# Patient Record
Sex: Female | Born: 1970 | Race: White | Hispanic: No | Marital: Single | Smoking: Current every day smoker
Health system: Southern US, Community
[De-identification: ages and names within clinical notes are randomized; demographics above are authoritative.]

## PROBLEM LIST (undated history)

## (undated) DIAGNOSIS — T79A0XA Compartment syndrome, unspecified, initial encounter: Secondary | ICD-10-CM

## (undated) DIAGNOSIS — R1310 Dysphagia, unspecified: Secondary | ICD-10-CM

## (undated) DIAGNOSIS — F17200 Nicotine dependence, unspecified, uncomplicated: Secondary | ICD-10-CM

## (undated) DIAGNOSIS — F1910 Other psychoactive substance abuse, uncomplicated: Secondary | ICD-10-CM

## (undated) DIAGNOSIS — F319 Bipolar disorder, unspecified: Secondary | ICD-10-CM

## (undated) DIAGNOSIS — M21372 Foot drop, left foot: Secondary | ICD-10-CM

## (undated) DIAGNOSIS — F1010 Alcohol abuse, uncomplicated: Secondary | ICD-10-CM

## (undated) DIAGNOSIS — E559 Vitamin D deficiency, unspecified: Secondary | ICD-10-CM

## (undated) DIAGNOSIS — N189 Chronic kidney disease, unspecified: Secondary | ICD-10-CM

## (undated) DIAGNOSIS — K029 Dental caries, unspecified: Secondary | ICD-10-CM

## (undated) HISTORY — PX: TONSILLECTOMY: SUR1361

## (undated) HISTORY — PX: HAND SURGERY: SHX662

## (undated) HISTORY — DX: Foot drop, left foot: M21.372

## (undated) HISTORY — DX: Vitamin D deficiency, unspecified: E55.9

## (undated) HISTORY — DX: Other psychoactive substance abuse, uncomplicated: F19.10

## (undated) HISTORY — DX: Bipolar disorder, unspecified: F31.9

## (undated) HISTORY — DX: Dysphagia, unspecified: R13.10

## (undated) HISTORY — PX: OTHER SURGICAL HISTORY: SHX169

---

## 2007-12-12 ENCOUNTER — Inpatient Hospital Stay (HOSPITAL_COMMUNITY): Admission: AD | Admit: 2007-12-12 | Discharge: 2007-12-12 | Payer: Self-pay | Admitting: Obstetrics & Gynecology

## 2007-12-15 ENCOUNTER — Observation Stay (HOSPITAL_COMMUNITY): Admission: AD | Admit: 2007-12-15 | Discharge: 2007-12-16 | Payer: Self-pay | Admitting: Obstetrics and Gynecology

## 2008-01-26 ENCOUNTER — Encounter (INDEPENDENT_AMBULATORY_CARE_PROVIDER_SITE_OTHER): Payer: Self-pay | Admitting: Obstetrics & Gynecology

## 2008-01-26 ENCOUNTER — Inpatient Hospital Stay (HOSPITAL_COMMUNITY): Admission: AD | Admit: 2008-01-26 | Discharge: 2008-01-29 | Payer: Self-pay | Admitting: Obstetrics & Gynecology

## 2011-04-07 NOTE — Op Note (Signed)
[name], [name] NO.:  [id number]   MEDICAL RECORD NO.:  [id number]          PATIENT TYPE:  INP   LOCATION:  9118                          FACILITY:  [facility]   PHYSICIAN:  Freddy Finner, M.D.   DATE OF BIRTH:  07-Mar-1971   DATE OF PROCEDURE:  01/26/2008  DATE OF DISCHARGE:                               OPERATIVE REPORT   PREOPERATIVE DIAGNOSIS:  Intrauterine pregnancy 38 1/[redacted] weeks gestation  in labor with fetal distress and suspected marginal placental abruption.   POSTOPERATIVE DIAGNOSIS:  1. Intrauterine pregnancy 38 1/[redacted] weeks gestation in labor with fetal      distress and suspected marginal placental abruption.  2. Small serosal myoma on the posterior fundal surface.   OPERATIVE PROCEDURE:  Primary low transverse cervical cesarean section.   SURGEON:  Freddy Finner, M.D.   ANESTHESIA:  General endotracheal anesthesia.   COMPLICATIONS:  None.   OPERATIVE FINDINGS:  Delivery of a viable female infant, Apgars 7 at 1  minute and 9 at 5 minutes assigned by Dr. Alison Murray in attendance.  Arterial cord blood pH was 7.29.  Fetal weight was less than 6 pounds.  Normal tubes and ovaries.   INDICATIONS FOR PROCEDURE:  The patient is a 40 year old single white  female gravida 3, para 1, whose first pregnancy delivered vaginally in  1996.  Her prenatal course was remarkable for some preterm labor  controlled by oral tocolytics and rest.  She presented on the day of  delivery after an office visit at which time she was seen to be 2-3 cm  dilated.  She presented again several hours later with a history of  regular contractions since approximately 3 p.m.  Her cervix was  initially unchanged and triaged and she was allowed to walk for one  hour, remonitored, rechecked, and was found to be changing her cervix  which was said to be 3-4 cm, 100%.  At that time, variable decelerations  were noted.  After her arrival in labor and delivery, she was evaluated,  examined, there  was bloody show with membranes intact, the cervix was 4-  5 dilated, 100% effaced.  The membrane was ruptured and there was a  small amount of blood, but no port wine type fluid.  Clear fluid was  mixed with small clots.  A scalp lead was applied, continuous monitoring  was carried out using the scalp lead.  I stayed at the bedside observing  the labor and the fetal heart rate tracing and it progressively worsened  with very deep severe variable type decelerations and decreasing  baseline and some decreasing beat-to-beat variability.  At this point,  it was elected to proceed with cesarean delivery.   DESCRIPTION OF PROCEDURE:  The patient was taken to the operating room.  Fetal monitoring was re-established immediately and fetal heart rate at  that time was approximately 133.  Within a minute or so of re-  establishing the monitor, we had another deep deceleration and it was  elected to proceed with general rather than regional anesthesia.  The  abdomen was prepped in the usual fashion.  A Foley  catheter was placed  using sterile technique.  Sterile drapes were applied.  The induction  was accomplished and the procedure started.  A transverse lower  abdominal incision was made and carried sharply down to fascia which was  entered sharply and extended the full extent of the skin incision.  The  rectus sheath was developed superiorly and inferiorly with blunt and  sharp dissection.  The rectus muscles were divided in the midline.  The  peritoneum was entered sharply and extended bluntly to the extent of a  skin incision.  A bladder blade was placed.  A transverse incision was  made in the visceroperitoneum overlying the lower segment.  The lower  segment was then entered with a scalpel and the incision extended  transversely.  The infant was elevated out of the pelvis.  A Kiwi vacuum  extractor was then used to ease the delivery and a viable female was  delivered without significant  intraoperative complications or trauma.  There was a loose nuchal cord and a body cord and a cord around the  feet.  Statistics on the baby are noted above.  The placenta was  delivered and there was less than 5% marginal separation of an anterior  implanted lower segment placenta.  There were three vessels in the cord.  Manual exploration of the uterus confirmed complete evacuation of  products of conception.  The uterus was delivered on the anterior  abdominal wall.  The patient was given 1 gram Ancef as soon as possible  after starting the procedure.  A very small superficial myoma was noted  on the posterior fundal surface measuring approximately 5-6 mm in  greatest diameter and 1-2 mm in thickness.  This was left in place.  Tubes and ovaries were normal.  The bladder blade was replaced.  The  lower uterine segment was then closed with a running suture of 0  Monocryl for the first layer with locking technique and an imbricating  second layer of the same suture.  Hemostasis was complete.  The bladder  flap was reapproximated with interrupted figure-of-eight sutures of 0  Vicryl.  The uterus was delivered back into the abdominal cavity.  Again, hemostasis was complete.  Pack, needle, and instrument counts  were correct.  The abdominal incision was closed in layers.  0 Monocryl  was used to close the peritoneum and reapproximate the rectus muscles,  the fascia was closed with a running double 0 PDS.  Irrigation in the  subcu space was carried out.  The subcutaneous tissue was reapproximated  with a running 2-0 plain suture.  The skin was closed with skin staples  and 1/4 inch Steri-Strips.  The patient tolerated the procedure well.  She was awakened and taken to recovery in good condition.      Freddy Finner, M.D.  Electronically Signed     WRN/MEDQ  D:  01/26/2008  T:  01/26/2008  Job:  [id number]

## 2011-04-10 NOTE — Discharge Summary (Signed)
[name], [name]           ACCOUNT NO.:  [id number]   MEDICAL RECORD NO.:  [id number]          PATIENT TYPE:  INP   LOCATION:  9118                          [facility]ILITY:  [facility]   PHYSICIAN:  Dineen Kid. Rana Snare, M.D.    DATE OF BIRTH:  1971/01/21   DATE OF ADMISSION:  01/26/2008  DATE OF DISCHARGE:  01/29/2008                               DISCHARGE SUMMARY   ADMITTING DIAGNOSES:  1. Intrauterine pregnancy at 55 and 1/2 weeks' estimated gestational      age.  2. Spontaneous onset of labor.   DISCHARGE DIAGNOSES:  1. Status post low transverse cesarean section, secondary to marginal      placental abruption.  2. Viable female infant.   PROCEDURE:  Primary low transverse cesarean section.   REASON FOR ADMISSION:  Please see written H&P.   HOSPITAL COURSE:  The patient is 40 year old white single female,  gravida 3, para 1, who was admitted to [facility] with  spontaneous onset of labor.  On admission, vital signs were stable.  Cervix was dilated 3-4 cm, 100% effaced.  Upon monitoring, variable  decelerations were noted.  After the patient was transferred to the  labor delivery area, she was reexamined and found to be 4-5 cm dilated,  100% effaced with bloody show noted and membranes intact.  Artificial  rupture of membranes was performed with a small amount of blood noted.  No port wine staining.  Some clear fluid was noted, mixed with some  small clotting.  A scalp lead was applied to the fetal vertex and  continuous monitoring was carried out.  The physician stayed at the  patient's bedside in observation of the labor and the fetal heart tone  tracing.  It was progressive, worsened with deep variable decelerations,  and decision was made to transfer patient to the operating room for a  stat cesarean delivery.  The patient was then transferred to the  operating room.  She was prepped, and general anesthesia was given.  A  low transverse incision was made with  delivery of a viable female infant  weighing 5 pounds 4 ounces with Apgars of 7 at one minute and 9 at five  minutes.  Arterial cord pH was 7.29.  The patient tolerated the  procedure well and was taken to the recovery room in stable condition.  On postoperative day 1, the patient was without complaints.  Vitals  signs were stable.  She was afebrile.  Abdomen soft.  Fundus firm and  nontender.  A small amount of drainage was noted on the abdominal  dressing, and Foley was draining with adequate amount of dilute urine.  Laboratory findings showed hemoglobin of 10.2, platelet count of  215,000, and WBC count of 12.4.  Blood type was noted to be O positive.  On postoperative day 2, the patient was without complaints.  Vital signs  remained stable.  She was afebrile.  Abdominal dressing was noted to be  clean, dry, and intact.  Abdomen soft with good return of bowel  function.  The fundus was firm and nontender.  On postoperative day 3,  the  patient was without complaints.  Vital signs were stable.  She was  afebrile.  Fundus firm and nontender.  Incision was clean, dry, and  intact.  Staples were removed, and the patient was later discharged  home.   CONDITION ON DISCHARGE:  Stable.   DIET:  Regular, as tolerated.   ACTIVITY:  No heavy lifting.  No driving x2 weeks.  No vaginal entry.   FOLLOWUP:  The patient to followup in the office in 1-2 weeks for an  incision check.  She is to call for temperature greater than 100  degrees, persistent nausea, vomiting, and heavy vaginal bleeding and/or  redness or drainage in the incision site.   DISCHARGE MEDICATIONS:  1. Tylox, #30, one p.o. q.4-6h. p.r.n.  2. Motrin 600 mg every 6 hours.  3. Prenatal vitamins 1 p.o. daily.  4. Colace 1 p.o. daily p.r.n.      Julio Sicks, N.P.      Dineen Kid Rana Snare, M.D.  Electronically Signed    CC/MEDQ  D:  02/14/2008  T:  02/15/2008  Job:  [id number]

## 2011-08-14 LAB — STREP B DNA PROBE

## 2011-08-14 LAB — CBC
HCT: 30.8 — ABNORMAL LOW
Hemoglobin: 10.9 — ABNORMAL LOW
Platelets: 251
WBC: 8.8

## 2011-08-14 LAB — URIC ACID: Uric Acid, Serum: 3.3

## 2011-08-14 LAB — COMPREHENSIVE METABOLIC PANEL
AST: 17
Alkaline Phosphatase: 93
CO2: 25
Chloride: 102
Creatinine, Ser: 0.66
GFR calc non Af Amer: >60
Potassium: 3.2 — ABNORMAL LOW
Total Bilirubin: 0.4

## 2011-08-14 LAB — URINALYSIS, ROUTINE W REFLEX MICROSCOPIC
Glucose, UA: NEGATIVE
Hgb urine dipstick: NEGATIVE
Ketones, ur: NEGATIVE
Protein, ur: NEGATIVE

## 2011-08-17 LAB — CBC
HCT: 33.7 — ABNORMAL LOW
MCHC: 35.3
MCHC: 35.7
MCV: 92.1
MCV: 92.2
Platelets: 215
Platelets: 235
RBC: 3.13 — ABNORMAL LOW
RDW: 12.8
RDW: 12.9

## 2012-02-17 ENCOUNTER — Other Ambulatory Visit: Payer: Self-pay | Admitting: Family Medicine

## 2012-02-17 DIAGNOSIS — Z1231 Encounter for screening mammogram for malignant neoplasm of breast: Secondary | ICD-10-CM

## 2012-02-24 ENCOUNTER — Ambulatory Visit
Admission: RE | Admit: 2012-02-24 | Discharge: 2012-02-24 | Disposition: A | Discharge status: Home / Self Care | Payer: PRIVATE HEALTH INSURANCE | Source: Ambulatory Visit | Attending: Family Medicine | Admitting: Family Medicine

## 2012-02-24 DIAGNOSIS — Z1231 Encounter for screening mammogram for malignant neoplasm of breast: Secondary | ICD-10-CM

## 2013-05-31 ENCOUNTER — Encounter: Payer: Self-pay | Admitting: Gynecology

## 2013-05-31 ENCOUNTER — Ambulatory Visit (INDEPENDENT_AMBULATORY_CARE_PROVIDER_SITE_OTHER): Payer: PRIVATE HEALTH INSURANCE | Admitting: Gynecology

## 2013-05-31 VITALS — BP 110/70 | Ht 61.25 in | Wt 106.0 lb

## 2013-05-31 DIAGNOSIS — Z309 Encounter for contraceptive management, unspecified: Secondary | ICD-10-CM

## 2013-05-31 NOTE — Patient Instructions (Signed)
Followup for IUD insertion. Call if you have any other questions.

## 2013-05-31 NOTE — Progress Notes (Signed)
42 year old G3 P2 Ab1 patient presents to discuss contraceptive options. Currently not using anything for contraception. Initially wanted to review sterilization. Had recent full GYN exam historically with Pap smear at her primary physician's office. He is here only to talk about contraception. Not being followed for any medical issues. Does smoke cigarettes daily. Menses are regular and light with mild dysmenorrhea. History of cesarean section times one for fetal distress and vaginal delivery x1.  I extensively reviewed all contraceptive options with her to include pill, patch, ring, Depo-Provera, nexplanon, IUD, essure, sterilization, both female and female. The pros/cons, risks/benefits of each choice discussed. The issues of estrogen containing contraception, advancing age and cigarette smoking reviewed with increased risk of stroke heart attack DVT. After lengthy discussion the patient's leaning towards Mirena IUD. I reviewed the insertional process with her as well as the risks of infection, perforation or migration requiring surgery to remove, failure with pregnancy and hormonal side effects such as headaches acne breast tenderness. Patient's going to think about options. I gave her a booklet on Mirena IUD she will followup if she decides to have this placed or for further discussion.

## 2013-08-28 ENCOUNTER — Other Ambulatory Visit: Payer: Self-pay

## 2013-08-28 DIAGNOSIS — Z1231 Encounter for screening mammogram for malignant neoplasm of breast: Secondary | ICD-10-CM

## 2013-09-11 ENCOUNTER — Ambulatory Visit
Admission: RE | Admit: 2013-09-11 | Discharge: 2013-09-11 | Disposition: A | Discharge status: Home / Self Care | Payer: PRIVATE HEALTH INSURANCE | Source: Ambulatory Visit

## 2013-09-11 DIAGNOSIS — Z1231 Encounter for screening mammogram for malignant neoplasm of breast: Secondary | ICD-10-CM

## 2014-08-06 ENCOUNTER — Other Ambulatory Visit: Payer: Self-pay

## 2014-08-06 DIAGNOSIS — Z1231 Encounter for screening mammogram for malignant neoplasm of breast: Secondary | ICD-10-CM

## 2014-09-24 ENCOUNTER — Encounter: Payer: Self-pay | Admitting: Gynecology

## 2014-10-03 ENCOUNTER — Encounter (INDEPENDENT_AMBULATORY_CARE_PROVIDER_SITE_OTHER): Payer: Self-pay

## 2014-10-03 ENCOUNTER — Ambulatory Visit
Admission: RE | Admit: 2014-10-03 | Discharge: 2014-10-03 | Disposition: A | Discharge status: Home / Self Care | Payer: PRIVATE HEALTH INSURANCE | Source: Ambulatory Visit

## 2014-10-03 DIAGNOSIS — Z1231 Encounter for screening mammogram for malignant neoplasm of breast: Secondary | ICD-10-CM

## 2015-09-03 ENCOUNTER — Other Ambulatory Visit: Payer: Self-pay

## 2015-09-03 DIAGNOSIS — Z1231 Encounter for screening mammogram for malignant neoplasm of breast: Secondary | ICD-10-CM

## 2015-10-08 ENCOUNTER — Ambulatory Visit
Admission: RE | Admit: 2015-10-08 | Discharge: 2015-10-08 | Disposition: A | Discharge status: Home / Self Care | Payer: BLUE CROSS/BLUE SHIELD | Source: Ambulatory Visit

## 2015-10-08 ENCOUNTER — Encounter (INDEPENDENT_AMBULATORY_CARE_PROVIDER_SITE_OTHER): Payer: Self-pay

## 2015-10-08 DIAGNOSIS — Z1231 Encounter for screening mammogram for malignant neoplasm of breast: Secondary | ICD-10-CM

## 2016-03-02 ENCOUNTER — Ambulatory Visit (INDEPENDENT_AMBULATORY_CARE_PROVIDER_SITE_OTHER): Payer: BLUE CROSS/BLUE SHIELD | Admitting: Internal Medicine

## 2016-03-02 VITALS — BP 110/64 | HR 86 | Temp 98.0°F | Resp 16 | Ht 61.0 in | Wt 94.0 lb

## 2016-03-02 DIAGNOSIS — G4700 Insomnia, unspecified: Secondary | ICD-10-CM

## 2016-03-02 DIAGNOSIS — F411 Generalized anxiety disorder: Secondary | ICD-10-CM | POA: Diagnosis not present

## 2016-03-02 DIAGNOSIS — R040 Epistaxis: Secondary | ICD-10-CM

## 2016-03-02 MED ORDER — TRIAZOLAM 0.25 MG PO TABS: 0.2500 mg | ORAL_TABLET | Freq: Every evening | ORAL | Status: DC | PRN

## 2016-03-02 NOTE — Patient Instructions (Addendum)
For headache use ibuprofen chewable 100mg  tabs and can take up to 8 tabs every 8 hrs if needed for up to 1 week    IF you received an x-ray today, you will receive an invoice from [facility]. Please contact [facility] at [phone] with questions or concerns regarding your invoice.   IF you received labwork today, you will receive an invoice from [employer]. Please contact [facility] at [phone] with questions or concerns regarding your invoice.   Our billing staff will not be able to assist you with questions regarding bills from these companies.  You will be contacted with the lab results as soon as they are available. The fastest way to get your results is to activate your My Chart account. Instructions are located on the last page of this paperwork. If you have not heard from [place] regarding the results in 2 weeks, please contact this office.
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
## 2016-03-12 ENCOUNTER — Encounter: Payer: Self-pay | Admitting: *Deleted

## 2016-03-13 ENCOUNTER — Telehealth: Payer: Self-pay

## 2016-03-13 NOTE — Telephone Encounter (Signed)
Patient manager is calling regarding her absence from work. He states he was advised to call if there are any questions.  Varney Baas [phone] or [phone])  Also, we no longer needs to send a fax.

## 2016-03-13 NOTE — Telephone Encounter (Signed)
Patient is calling because yesterday we tried to fax her work note but it wouldn't go through. She wants to know if we can try to send it again. Fax: [phone] Attn: Varney Baas

## 2016-03-16 NOTE — Telephone Encounter (Signed)
Spoke with Nicki Reaper, advised him that I could not go into detail of what the pt was seen for. He wanted to know a round about time she was coming back. According to the note, she will return May 1. We would not know until she follows up.

## 2016-03-16 NOTE — Telephone Encounter (Signed)
Left message for pt to call back  °

## 2016-05-06 ENCOUNTER — Ambulatory Visit (INDEPENDENT_AMBULATORY_CARE_PROVIDER_SITE_OTHER): Payer: BLUE CROSS/BLUE SHIELD | Admitting: Internal Medicine

## 2016-05-06 ENCOUNTER — Ambulatory Visit (INDEPENDENT_AMBULATORY_CARE_PROVIDER_SITE_OTHER): Payer: BLUE CROSS/BLUE SHIELD

## 2016-05-06 VITALS — BP 106/64 | HR 90 | Temp 97.6°F | Resp 16 | Ht 61.0 in | Wt 94.2 lb

## 2016-05-06 DIAGNOSIS — R0602 Shortness of breath: Secondary | ICD-10-CM

## 2016-05-06 DIAGNOSIS — F3112 Bipolar disorder, current episode manic without psychotic features, moderate: Secondary | ICD-10-CM | POA: Diagnosis not present

## 2016-05-06 DIAGNOSIS — R05 Cough: Secondary | ICD-10-CM | POA: Diagnosis not present

## 2016-05-06 DIAGNOSIS — F17200 Nicotine dependence, unspecified, uncomplicated: Secondary | ICD-10-CM | POA: Insufficient documentation

## 2016-05-06 DIAGNOSIS — R059 Cough, unspecified: Secondary | ICD-10-CM

## 2016-05-06 DIAGNOSIS — F319 Bipolar disorder, unspecified: Secondary | ICD-10-CM | POA: Insufficient documentation

## 2016-05-06 DIAGNOSIS — R0989 Other specified symptoms and signs involving the circulatory and respiratory systems: Secondary | ICD-10-CM

## 2016-05-06 DIAGNOSIS — F309 Manic episode, unspecified: Secondary | ICD-10-CM | POA: Diagnosis not present

## 2016-05-06 DIAGNOSIS — Z720 Tobacco use: Secondary | ICD-10-CM

## 2016-05-06 MED ORDER — ALBUTEROL SULFATE HFA 108 (90 BASE) MCG/ACT IN AERS: 2.0000 | INHALATION_SPRAY | Freq: Four times a day (QID) | RESPIRATORY_TRACT | Status: DC | PRN

## 2016-05-06 NOTE — Progress Notes (Addendum)
Subjective:  By signing my name below, I, [name], attest that this documentation has been prepared under the direction and in the presence of Tami Lin, MD.  Electronically Signed: Thea Alken, ED Scribe. 05/06/2016. 3:16 PM.   Patient ID: [name], female    DOB: [birth year], 45 y.o.   MRN: [id number]  HPI Chief Complaint  Patient presents with  . Wants to be tested for COPD  . Shoulder Pain    left shoulder pain     HPI Comments: [name] is a 45 y.o. female who presents to the [facility] complaining of SOB. She would like to be tested for COPD. She reports feeling SOB with walking up steps as well as with cough. Pt is a smoker. She recently had a friend diagnosed with COPD.    Pt has hx of insomnia. Pt has been taking equetro and seroquel prescribed by Dr. Toy Care.  Pt reports equetro has been working well for her, stating medication has been helping her "keep it all together". She takes seroquel at bed time. Still has symptoms but is better.  Currently having trouble with employer, [employer]. Is out of work due to psych instabil and they want to fire her instead of grant leave. Her lawyer recommending disability   History reviewed. No pertinent past medical history. Past Surgical History  Procedure Laterality Date  . Cesarean section      fetal distress   No Known Allergies Prior to Admission medications   Medication Sig Start Date End Date Taking? Authorizing Provider  aspirin 81 MG tablet Take 81 mg by mouth daily.   Yes Historical Provider, MD  Carbamazepine (EQUETRO) 100 MG CP12 12 hr capsule Take 100 mg by mouth.   Yes Historical Provider, MD  QUEtiapine (SEROQUEL) 50 MG tablet Take 50 mg by mouth at bedtime.   Yes Historical Provider, MD  Vitamin D, Ergocalciferol, (DRISDOL) 50000 units CAPS capsule Take 50,000 Units by mouth every 7 (seven) days.   Yes Historical Provider, MD   Review of Systems  Respiratory:  Positive for shortness of breath.   Psychiatric/Behavioral: Positive for sleep disturbance.    Strained shoulder last week but better and decided not to have evaluated today Objective:   Physical Exam  Constitutional: She is oriented to person, place, and time. She appears well-developed and well-nourished. No distress.  HENT:  Head: Normocephalic and atraumatic.  Eyes: Conjunctivae and EOM are normal.  Neck: Neck supple. No thyromegaly present.  Cardiovascular: Normal rate, regular rhythm and normal heart sounds.   Pulmonary/Chest: Effort normal. She has no wheezes. She has rhonchi ( scattered, at bases).  Musculoskeletal: Normal range of motion.  Lymphadenopathy:    She has no cervical adenopathy.  Neurological: She is alert and oriented to person, place, and time. No cranial nerve deficit.  Skin: Skin is warm and dry.  Psychiatric:  Rapid speech//hyper//but thought appropriate  Nursing note and vitals reviewed.   Filed Vitals:   05/06/16 1506  BP: 106/64  Pulse: 90  Temp: 97.6 F (36.4 C)  TempSrc: Oral  Resp: 16  Height: 5\' 1"  (1.549 m)  Weight: 94 lb 3.2 oz (42.729 kg)  SpO2: 99%    Dg Chest 2 View  05/06/2016  CLINICAL DATA:  45 year old female with cough and shortness of breath. Smoker. Initial encounter. EXAM: CHEST  2 VIEW COMPARISON:  None. FINDINGS: Large lung volumes. Normal cardiac size and mediastinal contours. Visualized tracheal air column is within  normal limits. No pneumothorax, pulmonary edema, pleural effusion or confluent pulmonary opacity. Minimal increased interstitial markings diffusely. Osteopenia, otherwise no osseous abnormality. IMPRESSION: No acute cardiopulmonary abnormality. Hyperinflation. Mild increased interstitial markings which are likely the sequelae of smoking. Electronically Signed   By: Genevie Ann M.D.   On: 05/06/2016 16:04   PFTs are normal!!  Assessment & Plan:  Cough - Smoker -  SOB (shortness of breath) - Hyperinflation of  lungs ----stressed the need to stop smoking now!! Gradually in midst of psych crisis ----use ventolin inh prn for sob ----consider steroid inhaler (losing insurance and can't afford)  Mania ([place]) Bipolar affective disorder, currently manic, moderate ([place]) ----continue w/ Dr Toy Care ----add counselor   May establish with [facility] if loses insurance and job as expected  I have completed the patient encounter in its entirety as documented by the scribe, with editing by me where necessary. Judah Chevere P. Laney Pastor, M.D.

## 2016-05-06 NOTE — Patient Instructions (Signed)
     IF you received an x-ray today, you will receive an invoice from [facility]. Please contact [facility] at [phone] with questions or concerns regarding your invoice.   IF you received labwork today, you will receive an invoice from [facility]. Please contact [facility] at [phone] with questions or concerns regarding your invoice.   Our billing staff will not be able to assist you with questions regarding bills from these companies.  You will be contacted with the lab results as soon as they are available. The fastest way to get your results is to activate your My Chart account. Instructions are located on the last page of this paperwork. If you have not heard from us regarding the results in 2 weeks, please contact this office.      

## 2016-10-02 DIAGNOSIS — Z0271 Encounter for disability determination: Secondary | ICD-10-CM

## 2016-10-16 DIAGNOSIS — Z0271 Encounter for disability determination: Secondary | ICD-10-CM

## 2016-12-14 ENCOUNTER — Other Ambulatory Visit: Payer: Self-pay | Admitting: Nurse Practitioner

## 2016-12-14 DIAGNOSIS — Z1231 Encounter for screening mammogram for malignant neoplasm of breast: Secondary | ICD-10-CM

## 2016-12-29 ENCOUNTER — Ambulatory Visit
Admission: RE | Admit: 2016-12-29 | Discharge: 2016-12-29 | Disposition: A | Discharge status: Home / Self Care | Payer: Medicaid Other | Source: Ambulatory Visit | Attending: Nurse Practitioner | Admitting: Nurse Practitioner

## 2016-12-29 DIAGNOSIS — Z1231 Encounter for screening mammogram for malignant neoplasm of breast: Secondary | ICD-10-CM

## 2018-01-05 ENCOUNTER — Other Ambulatory Visit: Payer: Self-pay | Admitting: Nurse Practitioner

## 2018-01-05 DIAGNOSIS — Z1231 Encounter for screening mammogram for malignant neoplasm of breast: Secondary | ICD-10-CM

## 2018-01-28 ENCOUNTER — Ambulatory Visit
Admission: RE | Admit: 2018-01-28 | Discharge: 2018-01-28 | Disposition: A | Discharge status: Home / Self Care | Payer: Medicaid Other | Source: Ambulatory Visit | Attending: Nurse Practitioner | Admitting: Nurse Practitioner

## 2018-01-28 DIAGNOSIS — Z1231 Encounter for screening mammogram for malignant neoplasm of breast: Secondary | ICD-10-CM

## 2018-06-18 ENCOUNTER — Emergency Department (HOSPITAL_COMMUNITY)
Admission: EM | Admit: 2018-06-18 | Discharge: 2018-06-19 | Disposition: A | Discharge status: Home / Self Care | Payer: Medicaid Other | Attending: Emergency Medicine | Admitting: Emergency Medicine

## 2018-06-18 ENCOUNTER — Other Ambulatory Visit: Payer: Self-pay

## 2018-06-18 ENCOUNTER — Encounter (HOSPITAL_COMMUNITY): Payer: Self-pay | Admitting: *Deleted

## 2018-06-18 DIAGNOSIS — Z79899 Other long term (current) drug therapy: Secondary | ICD-10-CM | POA: Diagnosis not present

## 2018-06-18 DIAGNOSIS — Z945 Skin transplant status: Secondary | ICD-10-CM

## 2018-06-18 DIAGNOSIS — M79672 Pain in left foot: Secondary | ICD-10-CM | POA: Diagnosis not present

## 2018-06-18 DIAGNOSIS — F17210 Nicotine dependence, cigarettes, uncomplicated: Secondary | ICD-10-CM | POA: Insufficient documentation

## 2018-06-18 DIAGNOSIS — Z7982 Long term (current) use of aspirin: Secondary | ICD-10-CM | POA: Insufficient documentation

## 2018-06-18 DIAGNOSIS — M79601 Pain in right arm: Secondary | ICD-10-CM | POA: Diagnosis not present

## 2018-06-18 MED ORDER — ONDANSETRON 4 MG PO TBDP
4.00 | ORAL_TABLET | ORAL | Status: DC
Start: ? — End: 2018-06-18

## 2018-06-18 MED ORDER — SENNOSIDES-DOCUSATE SODIUM 8.6-50 MG PO TABS
2.00 | ORAL_TABLET | ORAL | Status: DC
Start: 2018-06-17 — End: 2018-06-18

## 2018-06-18 MED ORDER — MELATONIN 3 MG PO TABS
3.00 | ORAL_TABLET | ORAL | Status: DC
Start: ? — End: 2018-06-18

## 2018-06-18 MED ORDER — ACETAMINOPHEN 325 MG PO TABS
325.00 | ORAL_TABLET | ORAL | Status: DC
Start: 2018-06-17 — End: 2018-06-18

## 2018-06-18 MED ORDER — DEXTROSE 10 % IV SOLN
250.00 | INTRAVENOUS | Status: DC
Start: ? — End: 2018-06-18

## 2018-06-18 MED ORDER — MAGNESIUM OXIDE 400 MG PO TABS
400.00 | ORAL_TABLET | ORAL | Status: DC
Start: 2018-06-17 — End: 2018-06-18

## 2018-06-18 MED ORDER — POLYETHYLENE GLYCOL 3350 17 G PO PACK
17.00 g | PACK | ORAL | Status: DC
Start: 2018-06-18 — End: 2018-06-18

## 2018-06-18 MED ORDER — OXYCODONE-ACETAMINOPHEN 5-325 MG PO TABS
1.00 | ORAL_TABLET | ORAL | Status: DC
Start: ? — End: 2018-06-18

## 2018-06-18 MED ORDER — FENTANYL CITRATE (PF) 100 MCG/2ML IJ SOLN
50.0000 ug | Freq: Once | INTRAMUSCULAR | Status: AC
  Administered 2018-06-18: 50 ug via INTRAMUSCULAR
  Filled 2018-06-18: qty 2

## 2018-06-18 MED ORDER — CYCLOBENZAPRINE HCL 5 MG PO TABS
5.00 | ORAL_TABLET | ORAL | Status: DC
Start: ? — End: 2018-06-18

## 2018-06-18 MED ORDER — TRAMADOL HCL 50 MG PO TABS
50.00 | ORAL_TABLET | ORAL | Status: DC
Start: ? — End: 2018-06-18

## 2018-06-18 MED ORDER — GENERIC EXTERNAL MEDICATION
1.00 | Status: DC
Start: 2018-06-18 — End: 2018-06-18

## 2018-06-18 MED ORDER — ENOXAPARIN SODIUM 30 MG/0.3ML ~~LOC~~ SOLN
30.00 | SUBCUTANEOUS | Status: DC
Start: 2018-06-17 — End: 2018-06-18

## 2018-06-18 MED ORDER — FOLIC ACID 1 MG PO TABS
1.00 | ORAL_TABLET | ORAL | Status: DC
Start: 2018-06-18 — End: 2018-06-18

## 2018-06-18 NOTE — ED Notes (Signed)
Wendy Hatchet, RN called the brother listed in the chart to arrange pickup. The brother requested to speak with the provider.

## 2018-06-18 NOTE — Discharge Instructions (Addendum)
Continue current medications.  I have placed a social work consult to help you get home health.  You can purchase a cane from any medical supply store, [facility] or Pharmacy.  You have a prescription in your paperwork

## 2018-06-18 NOTE — ED Notes (Signed)
This RN and another RN removed the bandage, cleaned the wound with normal saline and re wrapped the arm.

## 2018-06-18 NOTE — ED Notes (Signed)
Pt brother Patsi Sears would like a call from the nurse so he can give some background information on pt [phone]

## 2018-06-18 NOTE — ED Provider Notes (Addendum)
[facility] EMERGENCY DEPARTMENT Provider Note   CSN: [id number] Arrival date & time: 06/18/18  2049     History   Chief Complaint Chief Complaint  Patient presents with  . Foot Pain    HPI [name] is a 47 y.o. female.  The history is provided by the patient. No language interpreter was used.  Arm Injury   This is a recurrent problem. The current episode started more than 1 week ago. The problem occurs constantly. The problem has not changed since onset.The pain is present in the right arm. The pain is moderate. Pertinent negatives include no numbness. She has tried nothing for the symptoms. The treatment provided no relief. There has been no history of extremity trauma.  Pt was discharged from [facility] yesterday.  Pt reports home health was suppose to come out today and change her dressing but they did not.  Pt reports she has pain in her left foot (chronic)  Pt reports she was suppose to get a cane from home health but they did not bring.    History reviewed. No pertinent past medical history.  Patient Active Problem List   Diagnosis Date Noted  . Smoker 05/06/2016  . Bipolar disorder ([place]) 05/06/2016    Past Surgical History:  Procedure Laterality Date  . CESAREAN SECTION     fetal distress     OB History    Gravida  3   Para  2   Term      Preterm      AB  1   Living  2     SAB      TAB      Ectopic      Multiple      Live Births               Home Medications    Prior to Admission medications   Medication Sig Start Date End Date Taking? Authorizing Provider  albuterol (PROVENTIL HFA;VENTOLIN HFA) 108 (90 Base) MCG/ACT inhaler Inhale 2 puffs into the lungs every 6 (six) hours as needed for wheezing or shortness of breath. 05/06/16   Leandrew Koyanagi, MD  aspirin 81 MG tablet Take 81 mg by mouth daily.    [provider]  Carbamazepine (EQUETRO) 100 MG CP12 12 hr capsule Take 100 mg by  mouth.    [provider]  QUEtiapine (SEROQUEL) 50 MG tablet Take 50 mg by mouth at bedtime.    [provider]  Vitamin D, Ergocalciferol, (DRISDOL) 50000 units CAPS capsule Take 50,000 Units by mouth every 7 (seven) days.    [provider]    Family History Family History  Problem Relation Age of Onset  . Cancer Mother        OVARIAN, NON-HODGKINS    Social History Social History   Tobacco Use  . Smoking status: Current Every Day Smoker    Packs/day: 1.00    Types: Cigarettes  . Smokeless tobacco: Never Used  Substance Use Topics  . Alcohol use: Yes  . Drug use: Yes    Comment: LSD     Allergies   Patient has no known allergies.   Review of Systems Review of Systems  Neurological: Negative for numbness.  All other systems reviewed and are negative.    Physical Exam Updated Vital Signs BP 116/72 (BP Location: Right Arm)   Pulse (!) 102   Temp 98.5 F (36.9 C) (Oral)   Resp 15  LMP  (LMP Unknown)   SpO2 99%   Physical Exam  Constitutional: She is oriented to person, place, and time. She appears well-developed and well-nourished.  HENT:  Head: Normocephalic.  Eyes: EOM are normal.  Neck: Normal range of motion.  Cardiovascular: Normal rate.  Pulmonary/Chest: Effort normal.  Abdominal: She exhibits no distension.  Musculoskeletal: Normal range of motion.  Neurological: She is alert and oriented to person, place, and time.  Skin: Skin is warm.  Psychiatric: She has a normal mood and affect.  Nursing note and vitals reviewed.    ED Treatments / Results  Labs (all labs ordered are listed, but only abnormal results are displayed) Labs Reviewed - No data to display  EKG None  Radiology No results found.  Procedures Procedures (including critical care time)  Medications Ordered in ED Medications  fentaNYL (SUBLIMAZE) injection 50 mcg (has no administration in time range)     Initial Impression / Assessment and  Plan / ED Course  I have reviewed the triage vital signs and the nursing notes.  Pertinent labs & imaging results that were available during my care of the patient were reviewed by me and considered in my medical decision making (see chart for details).     Pt has Rx for cane.  Pt advised she can get from medical supply store or Drug store.  Pt has percocet for pain.   Pt given injection of fentanyl and dressing changed.   Pt's brother called requesting pt be admitted. He reports pt is not capable of caring for herself and that he is at the beach.  He does not feel pt is mentally stable  Pt reassessed.  She reports she is does not want admission or placement.  Pt denies any current psychiatric issues.  Pt is not suicidal, she is not homicidal.  Pt denies any current depression.  Pt is alert oriented.  She answers questions appropriately.     Social work consult ordered.  Face to Face for home health.   Final Clinical Impressions(s) / ED Diagnoses   Final diagnoses:  Foot pain, left  Hx of skin graft    ED Discharge Orders    None    An After Visit Summary was printed and given to the patient.   [name] 06/18/18 2323    Wendy Saver, MD 06/19/18 0014    Wendy Meadow, PA-C 06/19/18 Wendy Sauer, MD 06/19/18 0030

## 2018-06-18 NOTE — ED Triage Notes (Signed)
Pt was at [facility] for a fall and ICU admission, was discharged yesterday. Pt had a skin graft (donor site at L thigh for L forearm). Bandage to L forearm with sutures and staples to L AC. Pt denies new injuries. Reports [facility] was supposed to show up to provide a bedside commode or cane, but "no one signed off on it" Pt also concerned for L foot drop

## 2018-06-19 ENCOUNTER — Encounter: Payer: Self-pay | Admitting: Surgery

## 2018-06-19 ENCOUNTER — Other Ambulatory Visit: Payer: Self-pay

## 2018-06-19 NOTE — ED Notes (Signed)
Pt alert and oriented in NAD. Pt verbalized understanding of discharge instructions. Pt discussed plan of care with NP and MD. She states she is able to make decisions and doesn't not want her brother doing so.

## 2018-06-19 NOTE — Progress Notes (Signed)
ED CM spoke with EDP K. Sofia concerning this patient who was seen at [facility] ED recommendation for [facility] RN for wound care management. Apparently patient was at [facility] and discharged and [facility] was awaiting orders to start services. Orders were placed yesterday in the ED and referral was faxed via [place] today. CM LVM with update.

## 2018-06-19 NOTE — ED Notes (Signed)
Dressing changed as ordered by PA. Pt dc by primary RN, instructions given and pt wheel out to lobby by primary nurse Benjamine Mola.

## 2018-06-27 ENCOUNTER — Encounter: Payer: Self-pay | Admitting: Physical Therapy

## 2018-06-27 ENCOUNTER — Ambulatory Visit: Payer: Medicaid Other | Admitting: Physical Therapy

## 2018-06-27 ENCOUNTER — Ambulatory Visit: Payer: Medicaid Other | Attending: Nurse Practitioner | Admitting: Occupational Therapy

## 2018-06-27 ENCOUNTER — Other Ambulatory Visit: Payer: Self-pay

## 2018-06-27 DIAGNOSIS — R2681 Unsteadiness on feet: Secondary | ICD-10-CM

## 2018-06-27 DIAGNOSIS — M79602 Pain in left arm: Secondary | ICD-10-CM | POA: Diagnosis present

## 2018-06-27 DIAGNOSIS — M6281 Muscle weakness (generalized): Secondary | ICD-10-CM

## 2018-06-27 DIAGNOSIS — M25642 Stiffness of left hand, not elsewhere classified: Secondary | ICD-10-CM | POA: Diagnosis not present

## 2018-06-27 DIAGNOSIS — R2689 Other abnormalities of gait and mobility: Secondary | ICD-10-CM | POA: Diagnosis present

## 2018-06-27 DIAGNOSIS — R600 Localized edema: Secondary | ICD-10-CM | POA: Diagnosis present

## 2018-06-27 DIAGNOSIS — M25622 Stiffness of left elbow, not elsewhere classified: Secondary | ICD-10-CM

## 2018-06-27 DIAGNOSIS — R278 Other lack of coordination: Secondary | ICD-10-CM

## 2018-06-27 DIAGNOSIS — M25632 Stiffness of left wrist, not elsewhere classified: Secondary | ICD-10-CM | POA: Diagnosis present

## 2018-06-27 NOTE — Patient Instructions (Signed)
Access Code: [email or web address]  URL: [email or web address]  Date: 06/27/2018  Prepared by: Barry Brunner   Exercises  Standing Ankle Dorsiflexion Stretch - 3 reps - 1 sets - 30 seconds hold - 3x daily - 7x weekly

## 2018-06-27 NOTE — Therapy (Signed)
[facility] [address] [place], [place], [address] Phone: [phone]   Fax:  [phone]  Occupational Therapy Evaluation  Patient Details  Name: [name] MRN: [id number] Date of Birth: February 21, 1971 Referring Provider: Jasmine Pang   Encounter Date: 06/27/2018  OT End of Session - 06/27/18 1357    Visit Number  1    Number of Visits  17    Date for OT Re-Evaluation  08/27/18    Authorization Type  MCD - awaiting authorization    OT Start Time  1230    OT Stop Time  1320    OT Time Calculation (min)  50 min    Activity Tolerance  Patient limited by pain    Behavior During Therapy  Restless;Anxious       No past medical history on file.  Past Surgical History:  Procedure Laterality Date  . CESAREAN SECTION     fetal distress    There were no vitals filed for this visit.  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 PMH: Bipolar, cocaine abuse    Limitations  NWB LUE, Fall risk, pt/family to get clarifications on any other restrictions/precautions LUE    Currently in Pain?  Yes    Pain Score  7     Pain Location  Leg    Pain Orientation  Left    Pain Descriptors / Indicators  Burning;Stabbing    Pain Type  Acute pain;Neuropathic pain    Pain Onset  1 to 4 weeks ago    Pain Frequency  Constant    Aggravating Factors   Nothing    Pain Relieving Factors  heating pad        [facility] OT Assessment - 06/27/18 1240      Assessment   Medical Diagnosis  paraplegia,incomplete left forearm/hand compartment syndrome, CTR, skin graft    Referring Provider  Jasmine Pang     Onset Date/Surgical Date  05/29/18    Hand Dominance  Right    Next MD Visit  tomorrow    Prior Therapy  CIR at [facility] 06/13/18 - 06/20/18      Precautions   Precautions  Fall    Precaution Comments  NWB LUE, pt to get clarifications re: other precautions LUE      Restrictions   Weight Bearing Restrictions  Yes    LUE Weight Bearing  Non weight bearing LUE      Balance Screen   Has the patient fallen in the past 6 months  Yes    How many times?  1    Has the patient had a decrease in activity level because of a fear of falling?   Yes    Is the patient reluctant to leave their home because of a fear of falling?   No      Home  Environment   Bathroom Shower/Tub  Tub/Shower unit;Curtain    Home Equipment  Tub bench;Cane -quad;Bedside commode    Additional Comments  Currently living with brother in 2 story home with bedroom on 1st floor. Prior to injury lived in 2 story townhome w/ 10 y.o.  son (bedrooms upstairs)      Prior Function   Level of Independence  Independent    Vocation  Retired applied for disability d/t bipolar      ADL   Eating/Feeding  Needs assist with cutting food    Grooming  Modified independent    Upper Body Bathing  Modified independent sponge bathing    Lower Body Bathing  -- sponge bathing    Upper Body Dressing  Increased time    Lower Body Dressing  Minimal assistance assist for socks, leaves laces untied/tucked    Toilet Transfer  Modified independent    Toileting - Clothing Manipulation  Modified independent    Toileting -  Hygiene  Modified Independent    ADL comments  Pt currently sponge bathing d/t skin graft volar forearm. Pt sees MD tomorrow to see if she can get in shower - pt has tub transfer bench      IADL   Shopping  Completely unable to shop    Light Housekeeping  -- has folded clothes one handed    Meal Prep  -- Can get snack/beverage w/ difficulty    Community Mobility  Relies on family or friends for transportation    Medication  Management  Is responsible for taking medication in correct dosages at correct time      Mobility   Mobility Status  Needs assist    Mobility Status Comments  walks w/ quad cane      Written Expression   Dominant Hand  Right      Vision - History   Additional Comments  denies changes      Cognition   Overall Cognitive Status  Cognition to be further assessed in functional context PRN Pt/family report back to baseline      Observation/Other Assessments   Observations  Pt w/ severe pain LLE, has cushion protector over LUE, Pt had dressing change to LUE prior to O.T. evaluation, skin graft from Lt thigh to Lt volar forearm      Sensation   Light Touch  Impaired by gross assessment    Additional Comments  Pt could detect at thumb and index finger Lt hand only, pt unable to feel dorsal and ulnar side of hand      Coordination   9 Hole Peg Test  -- unable Lt hand    Coordination  unable to pick up pen Lt hand. Can pick up small cone Lt hand w/ compensations but drops      Edema   Edema  moderate Lt hand      AROM   Overall AROM Comments  RUE AROM WNL'S. LUE Shoulder WFL's except IR 50% w/ pain. Elbow flex/ext grossly 75% (flex limited d/t wrapping?, extension limited d/t skin graft). Limited wrist motion (approx 10 degrees of motion). Fingers rest in flexion especially at PIP joints. Pt can make approx. 80% gross fist/finger flexion. Pt, w/ minimal active finger ext. (only at MP's, none at PIP's). Can passively extend fingers to grossly 90% with extrinsic tightness noted.       Hand Function   Right Hand Grip (lbs)  50 lbs    Left Hand Grip (lbs)  no grip strength Lt hand                        OT Short Term Goals - 06/27/18 1406      OT SHORT TERM GOAL #1   Title  Independent with HEP to increase  ROM Lt hand/wrist - 07/28/18    Baseline  dependent    Time  4    Period  Weeks    Status  New      OT SHORT TERM GOAL #2   Title  Pt to demo full composite flexion  Lt hand for grasping     Baseline  90%    Time  4    Period  Weeks    Status  New      OT SHORT TERM GOAL #3   Title  Pt to demo approx. 50% finger extension at PIP joints Lt hand in prep for grasping and for releasing objects    Baseline  none (only at MP joints)     Time  4    Period  Weeks    Status  New      OT SHORT TERM GOAL #4   Title  Pt to demo 90% elbow extension LUE for reaching activities    Baseline  75%    Time  4    Period  Weeks    Status  New      OT SHORT TERM GOAL #5   Title  Pt to don/doff socks using one handed technique and lace shoes w/ A/E at mod I level    Baseline  dependent    Time  4    Period  Weeks    Status  New        OT Long Term Goals - 06/27/18 1410      OT LONG TERM GOAL #1   Title  Pt to be independent with updated HEP - 08/27/18    Baseline  Dependent at this time    Time  8    Period  Weeks    Status  New      OT LONG TERM GOAL #2   Title  Pt to demo 90% active finger extension Lt hand for releasing objects consistently     Baseline  no active PIP extension    Time  8    Period  Weeks    Status  New      OT LONG TERM GOAL #3   Title  Pt to demo 20 lbs grip strength Lt hand to sufficiently hold and lift weighted objects    Baseline  no grip strength    Time  8    Period  Weeks    Status  New      OT LONG TERM GOAL #4   Title  Pt to demo Lt wrist extension to 30* or greater for functional tasks     Baseline  wrist ext only to neutral    Time  8    Period  Weeks    Status  New      OT LONG TERM GOAL #5   Title  Pt to return to simple cooking tasks and light cleaning tasks at mod I level    Baseline  dependent    Time  8    Period  Weeks    Status  New            Plan - 06/27/18 1359    Clinical Impression Statement  Pt is a 47 y.o. female who presents to outpatient rehab s/p extensive injuries from fall down stairs while intoxicated on 05/27/18. 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 Pt was admitted to [facility] for inpatient rehab on 06/13/18 and d/c 06/20/18 per pt report. Pt now presents with extremely impaired ROM, stiffness, weakness, and no functional use of Lt hand, wrist, and forearm. Pt also fall risk w/ LLE weakness.     Occupational Profile and client history currently impacting functional performance  PMH: Bipolar, cocaine abuse    Occupational performance deficits (Please refer to evaluation for details):  ADL's;IADL's;Social Participation    Rehab Potential  Fair    Current Impairments/barriers affecting progress:  severity of deficits, recent skin graft, ? carryover of recommendations, pain    OT Frequency  2x / week    OT Duration  8 weeks plus evaluation    OT Treatment/Interventions  Self-care/ADL training;Moist Heat;Fluidtherapy;DME and/or AE instruction;Splinting;Compression bandaging;Therapeutic activities;Ultrasound;Therapeutic exercise;Scar mobilization;Coping strategies training;Functional Mobility Training;Passive range of motion;Electrical Stimulation;Paraffin;Manual Therapy;Patient/family education    Plan  update HEP for Lt hand/wrist/forearm once further clarification has been made w/ MD     Clinical Decision Making  Several treatment options, min-mod task modification necessary    Consulted and Agree with Plan of Care  Patient;Family member/caregiver    Family Member Consulted  Brother       Patient will benefit from skilled therapeutic intervention in order to improve the following deficits and impairments:  Decreased coordination, Decreased range of motion, Increased edema, Impaired sensation, Increased fascial restrictions, Decreased skin integrity, Decreased knowledge of precautions, Impaired UE functional use, Pain, Decreased balance, Decreased mobility, Decreased strength, Impaired  perceived functional ability  Visit Diagnosis: Stiffness of left hand, not elsewhere classified - Plan: Ot plan of care cert/re-cert  Stiffness of left wrist, not elsewhere classified - Plan: Ot plan of care cert/re-cert  Stiffness of left elbow, not elsewhere classified - Plan: Ot plan of care cert/re-cert  Muscle weakness (generalized) - Plan: Ot plan of care cert/re-cert  Other lack of coordination - Plan: Ot plan of care cert/re-cert  Pain in left arm - Plan: Ot plan of care cert/re-cert  Localized edema - Plan: Ot plan of care cert/re-cert    Problem List Patient Active Problem List   Diagnosis Date Noted  . Smoker 05/06/2016  . Bipolar disorder ([place]) 05/06/2016    Carey Bullocks, OTR/L 06/27/2018, 2:19 PM  [facility] [address] [place], [place], [address] Phone: [phone]   Fax:  [phone]  Name: AMENA DOCKHAM MRN: [id number] Date of Birth: Sep 24, 1971

## 2018-06-28 NOTE — Therapy (Signed)
[facility] [address] [place], [place], [address] Phone: [phone]   Fax:  [phone]  Physical Therapy Evaluation  Patient Details  Name: DARCELLA SHIFFMAN MRN: [id number] Date of Birth: 1971/01/05 Referring Provider: Jasmine Pang   Encounter Date: 06/27/2018  PT End of Session - 06/27/18 1320    Visit Number  1    Number of Visits  8 eval plus 7 visits    Date for PT Re-Evaluation  -- TBD by Medicaid    Authorization Type  Medicaid;     Authorization Time Period  TBD by Medicaid    PT Start Time  1146    PT Stop Time  1228    PT Time Calculation (min)  42 min    Activity Tolerance  Patient limited by pain    Behavior During Therapy  Restless;Anxious       History reviewed. No pertinent past medical history.  Past Surgical History:  Procedure Laterality Date  . CESAREAN SECTION     fetal distress    There were no vitals filed for this visit.   Subjective Assessment - 06/27/18 1153    Subjective  I feel like I'm all cock-eyed when I walk and I'm going to need a chiropractor. The feeling is coming back in my leg and it's burning. I ran out of pain medicine.     Patient is accompained by:  Family member brother    Pertinent History  Bipolar disorder; polysubstance abuse     Diagnostic tests  MRI Lumbar-Anterolisthesis of L5 on S1 resulting in moderately severe foraminal stenosis on the left and moderate stenosis on the right, which results in mass effect on the left greater than right exiting L5 nerve root. Moderate spinal canal stenosis.    Patient Stated Goals  I'd like to be able to walk without a device.    Currently in Pain?  Yes    Pain Score  7     Pain Location  Leg    Pain Orientation  Left    Pain Descriptors / Indicators  Burning    Pain Type  Neuropathic pain;Acute pain    Pain Onset  1 to 4 weeks ago    Pain Frequency  Constant    Aggravating Factors   none known    Pain Relieving  Factors  nothing other than pain medicine         [facility] PT Assessment - 06/27/18 1158      Assessment   Medical Diagnosis  paraplegia,incomplete    Referring Provider  Jasmine Pang, NP    Onset Date/Surgical Date  05/29/18    Hand Dominance  Right    Prior Therapy  CIR at [facility]      Precautions   Precautions  Fall    Required Braces or Orthoses  -- pt reports appt already with [employer]; pending insurance      Restrictions   Weight Bearing Restrictions  Yes    LUE Weight Bearing  Non weight bearing      Balance Screen   Has the patient fallen in the past 6 months  No    Has the patient had a decrease in activity level because of a fear of falling?   No    Is the patient reluctant to leave their home because of a fear of falling?   No      Home Film/video editor residence    Living Arrangements  Other relatives brother    Available Help at Discharge  Family;Available PRN/intermittently    Type of Home  House    Home Access  Stairs to enter    Entrance Stairs-Number of Steps  4    Entrance Stairs-Rails  None    Home Layout  One level    [place] - quad;Bedside commode;Tub bench      Prior Function   Level of Independence  Independent    Vocation  Retired on [employer] prior to this accident due to loss of job      Cognition   Overall Cognitive Status  Within Functional Limits for tasks assessed I have bipolar disorder, but my thinking is the clearest its      Observation/Other Assessments   Observations  very restless, fidgeting due to pain    Skin Integrity  multiple scabbed areas on legs; bandage left shin; LUE remained in foam sling      Sensation   Light Touch  Impaired by gross assessment neuropathy      ROM / Strength   AROM / PROM / Strength  PROM;AROM;Strength      AROM   Overall AROM   Deficits    Overall AROM Comments  unable to DF left ankle      PROM   Overall PROM   Deficits    Overall PROM Comments  ankle DF  with knee extended to -5 degrees      Strength   Overall Strength  Deficits    Overall Strength Comments  WFL bil LEs except as noted below    Strength Assessment Site  Hip;Knee;Ankle    Right/Left Knee  Left    Left Knee Flexion  3+/5    Left Knee Extension  4-/5    Right/Left Ankle  Left    Left Ankle Dorsiflexion  0/5    Left Ankle Plantar Flexion  2/5      Transfers   Transfers  Sit to Stand    Five time sit to stand comments   15.5 no UEs; 60-69 norm = 12.7 sec    Comments  pt requested not to sit in the chair and stood from low mat table      Ambulation/Gait   Ambulation/Gait  Yes    Ambulation/Gait Assistance  5: Supervision    Ambulation/Gait Assistance Details  supervision for safety due to left foot drop and inablity to use LUE    Ambulation Distance (Feet)  80 Feet 60    Assistive device  Small based quad cane    Gait Pattern  Step-through pattern;Decreased step length - right;Decreased step length - left;Decreased dorsiflexion - left;Left steppage;Left foot flat;Poor foot clearance - left    Ambulation Surface  Level;Indoor    Gait velocity  32.24ft /28.62 sec=1.23 ft/sec                Objective measurements completed on examination: See above findings.              PT Education - 06/27/18 1311    Education Details  results of PT eval and PT POC; anticipate greater need with OT for LUE, therefore will request 1x/week for PT; DF stretch for Lt ankle    Person(s) Educated  Patient;Other (comment) brother    Methods  Explanation;Demonstration;Handout    Comprehension  Verbalized understanding;Returned demonstration;Verbal cues required;Need further instruction       PT Short Term Goals - 06/27/18 1347      PT  SHORT TERM GOAL #1   Title  Patient will be independent with basic HEP for LLE strengthening, stretching, and balance/gait.  (Target for all STGs TBD by Medicaid--by 3rd treatment visit)    Baseline  No HEP from inpatient rehab    Time  3     Period  Weeks    Status  New      PT SHORT TERM GOAL #2   Title  Patient will ambulate with LRAD and AFO (vs clinic AFO) with velocity >=1.81 ft/sec to demonstrate lesser fall risk.     Baseline  06/27/18 with SBQC and no AFO 1.23 ft/sec    Time  3    Period  Weeks    Status  New      PT SHORT TERM GOAL #3   Title  Patient will demonstrate incr strength by reducing 5 times sit to stand to <10 seconds.    Baseline  06/27/18 15.5 seconds (>12 sec indicates higher fall risk)    Time  3    Period  Weeks    Status  New      PT SHORT TERM GOAL #4   Title  Patient will ambulate 200 ft over level indoor surface in <=2 minutes    Baseline  06/27/18 Required standing rest break after 50 feet due to pain.     Time  3    Period  Weeks    Status  New        PT Long Term Goals - 06/27/18 1356      PT LONG TERM GOAL #1   Title  Patient will be independent with updated HEP (Target all LTGs by 7th visit--date TBD by Medicaid).     Baseline  06/27/18 no HEP     Time  7    Period  Weeks    Status  New      PT LONG TERM GOAL #2   Title  Patient will ambulate with LRAD and AFO (vs clinic AFO) with velocity >=2.0 ft/sec to demonstrate lesser fall risk.     Baseline  06/27/18 with SBQC and no AFO 1.23 ft/sec    Time  7    Period  Weeks    Status  New      PT LONG TERM GOAL #3   Title  Patient will ambulate 200 ft over level indoor surface in <=1 minute, 45 seconds    Baseline  06/27/18 Required standing rest break after 50 feet due to pain.     Time  7    Period  Weeks    Status  New      PT LONG TERM GOAL #4   Title  Patient will ascend/descend 2 steps no rail with LRAD vs no device modified independent    Baseline  8/5 uses SBQC and minguard    Time  7    Period  Weeks    Status  New             Plan - 06/27/18 1322    Clinical Impression Statement  Patient referred for OPPT s/p inpatient rehab stay due to fall down stairs with incomplete paraplegia (G82.22 Paraplegia,  incomplete) and prolonged "down time" resulting in rhabdomyolysis, left foot drop, and left forearm compartment syndrome requiring fasciotomy and skin graft. Patient is at increased risk for falls due to left foot drop and NWB status of LUE causing her to only be able to use a cane for support. She has had an  appointment with an orthotist Advertising copywriter) but does not have an anticpated date of delivery of an AFO for LLE. She can benefit from PT to address these deficits (and the deficits listed below) via the interventions listed below.     History and Personal Factors relevant to plan of care:  PMH-Bipolar disorder; polysubstance abuse   Personal factors-Coping skills/style; behavioral responses; home situation, expected progression of LLE nerve injury    Clinical Presentation  Evolving    Clinical Presentation due to:  Patient reports neuropathic LLE pain has intensified in the past week    Clinical Decision Making  Low    Rehab Potential  Good    Clinical Impairments Affecting Rehab Potential  Patient's pain tolerance;     PT Frequency  1x / week    PT Duration  Other (comment) 7 weeks    PT Treatment/Interventions  ADLs/Self Care Home Management;Electrical Stimulation;Gait training;DME Instruction;Stair training;Functional mobility training;Therapeutic activities;Therapeutic exercise;Balance training;Neuromuscular re-education;Orthotic Fit/Training;Patient/family education;Scar mobilization;Passive range of motion    PT Next Visit Plan  initiate HEP for LLE strengthening hip, knee, ankle; gait training (try SPC with quad rubber tip)    PT Home Exercise Plan  seated or standing left gastroc stretch    Consulted and Agree with Plan of Care  Patient;Family member/caregiver    Family Member Consulted  brother       Patient will benefit from skilled therapeutic intervention in order to improve the following deficits and impairments:  Abnormal gait, Decreased activity tolerance, Decreased balance,  Decreased mobility, Decreased knowledge of use of DME, Decreased range of motion, Decreased skin integrity, Decreased strength, Impaired flexibility, Impaired sensation, Impaired UE functional use, Pain  Visit Diagnosis: Muscle weakness (generalized) - Plan: PT plan of care cert/re-cert  Other abnormalities of gait and mobility - Plan: PT plan of care cert/re-cert  Unsteadiness on feet - Plan: PT plan of care cert/re-cert     Problem List Patient Active Problem List   Diagnosis Date Noted  . Smoker 05/06/2016  . Bipolar disorder ([place]) 05/06/2016    Rexanne Mano, PT 06/28/2018, 1:13 PM  [facility] [address] [place], [place], [address] Phone: [phone]   Fax:  [phone]  Name: SHANENA PELLEGRINO MRN: [id number] Date of Birth: 12/21/1970

## 2018-06-30 ENCOUNTER — Encounter (HOSPITAL_COMMUNITY): Payer: Self-pay | Admitting: *Deleted

## 2018-06-30 ENCOUNTER — Other Ambulatory Visit: Payer: Self-pay

## 2018-06-30 ENCOUNTER — Emergency Department (HOSPITAL_COMMUNITY)
Admission: EM | Admit: 2018-06-30 | Discharge: 2018-06-30 | Disposition: A | Discharge status: Home / Self Care | Payer: Medicaid Other | Attending: Emergency Medicine | Admitting: Emergency Medicine

## 2018-06-30 DIAGNOSIS — M7062 Trochanteric bursitis, left hip: Secondary | ICD-10-CM | POA: Diagnosis not present

## 2018-06-30 DIAGNOSIS — M21372 Foot drop, left foot: Secondary | ICD-10-CM | POA: Diagnosis not present

## 2018-06-30 DIAGNOSIS — M79605 Pain in left leg: Secondary | ICD-10-CM | POA: Diagnosis present

## 2018-06-30 DIAGNOSIS — Y939 Activity, unspecified: Secondary | ICD-10-CM | POA: Diagnosis not present

## 2018-06-30 DIAGNOSIS — F17210 Nicotine dependence, cigarettes, uncomplicated: Secondary | ICD-10-CM | POA: Diagnosis not present

## 2018-06-30 DIAGNOSIS — M792 Neuralgia and neuritis, unspecified: Secondary | ICD-10-CM | POA: Insufficient documentation

## 2018-06-30 DIAGNOSIS — Z79899 Other long term (current) drug therapy: Secondary | ICD-10-CM | POA: Insufficient documentation

## 2018-06-30 MED ORDER — LIDOCAINE 5 % EX PTCH: 1.0000 | MEDICATED_PATCH | CUTANEOUS | 0 refills | Status: DC

## 2018-06-30 NOTE — Discharge Instructions (Addendum)
Your symptoms are related to your nerves regenerating in your lower leg, and also from bursitis in your hip. Use heating pad to areas of pain, no more than 20 minutes per hour. Continue using all of your home medications, including the gabapentin. Use lidoderm patch to your lower leg to help with pain. Try getting a plantar fasciitis splint to keep your left foot in a more neutral position, until you get the AFO splint from your doctor. Follow up with your orthopedist in 1-2 weeks for recheck of symptoms and ongoing management of your pain. Return to the ER for emergent changes or worsening symptoms.

## 2018-06-30 NOTE — ED Notes (Signed)
Patient verbalizes understanding of discharge instructions. Opportunity for questioning and answers were provided. Armband removed by staff, pt discharged from ED.  

## 2018-06-30 NOTE — ED Notes (Signed)
EDP at bedside  

## 2018-06-30 NOTE — ED Provider Notes (Signed)
[facility] EMERGENCY DEPARTMENT Provider Note   CSN: [id number] Arrival date & time: 06/30/18  1227     History   Chief Complaint Chief Complaint  Patient presents with  . Post-op Problem    HPI [name] is a 47 y.o. female with a PMHx of bipolar disorder, polysubstance abuse, and alcoholism, who presents to the ED with complaints of continued L foot/lower leg pain since her admission to [facility] last month.  Chart review reveals that she was admitted to [facility] on 05/29/18 after repeated falls related to EtOH use, had traumatic rhabdomyolysis and AKI, sustained L hip hematoma and LLE nerve injury resulting in L foot drop, ended up developing L forearm/hand compartment syndrome and had fasciotomy and carpal tunnel release as well as several repeat surgeries, had wound vac placement; ultimately went home after rehab stay, was seen on 06/28/18 at [facility] ortho by Ms. Lindalou Hose FNP for staple removal on L forearm wounds; she complained of pain during that visit; Ms. Doy Mince prescribed her gabapentin 100-300mg  TID and placed referral to pain management, advised her to continue using the tramadol given to her by the wound care clinic.  She states that she has been taking gabapentin 300 mg 3 times a day for the last day and a half and it has not been helping.  She reports that primarily her pain is in her left leg, denies any acute injuries or trauma, states that the pain is still ongoing from her injuries last month.  Denies any issues with her L arm.  She describes the pain as 10/10 constant "pain like my leg is on fire" and stabbing pain primarily from the left knee down to the foot as well as some pain in the left hip, which worsens when anything touches her lower leg, and has been unrelieved with gabapentin, Aleve, tramadol, and Tylenol.  She reports ongoing numbness and tingling in the left lower leg/foot which is chronic and unchanged.  She states that she has left foot drop  as a result of her injuries, which is also not changed.  She is supposed to have an AFO made but she has not had this done yet because of insurance issues delaying it being made.  She is here because her pain is not being controlled with the gabapentin so she did not know what else to do.  She denies any LE swelling, erythema or warmth to the leg, fevers, chills, CP, SOB, abd pain, N/V/D/C, hematuria, dysuria, or any other complaints at this time.   The history is provided by the patient and medical records. No language interpreter was used.    History reviewed. No pertinent past medical history.  Patient Active Problem List   Diagnosis Date Noted  . Smoker 05/06/2016  . Bipolar disorder ([place]) 05/06/2016    Past Surgical History:  Procedure Laterality Date  . CESAREAN SECTION     fetal distress     OB History    Gravida  3   Para  2   Term      Preterm      AB  1   Living  2     SAB      TAB      Ectopic      Multiple      Live Births               Home Medications    Prior to Admission medications   Medication Sig Start Date  End Date Taking? Authorizing Provider  acetaminophen (TYLENOL) 500 MG tablet Take 500 mg by mouth every 6 (six) hours as needed.    [provider]  albuterol (PROVENTIL HFA;VENTOLIN HFA) 108 (90 Base) MCG/ACT inhaler Inhale 2 puffs into the lungs every 6 (six) hours as needed for wheezing or shortness of breath. Patient not taking: Reported on 06/27/2018 05/06/16   Leandrew Koyanagi, MD  aspirin 81 MG tablet Take 81 mg by mouth daily.    [provider]  Carbamazepine (EQUETRO) 100 MG CP12 12 hr capsule Take 100 mg by mouth.    [provider]  QUEtiapine (SEROQUEL) 50 MG tablet Take 50 mg by mouth at bedtime.    [provider]  Vitamin D, Ergocalciferol, (DRISDOL) 50000 units CAPS capsule Take 50,000 Units by mouth every 7 (seven) days.    [provider]    Family History Family  History  Problem Relation Age of Onset  . Cancer Mother        OVARIAN, NON-HODGKINS    Social History Social History   Tobacco Use  . Smoking status: Current Every Day Smoker    Packs/day: 1.00    Types: Cigarettes  . Smokeless tobacco: Never Used  Substance Use Topics  . Alcohol use: Yes  . Drug use: Yes    Comment: LSD     Allergies   Patient has no known allergies.   Review of Systems Review of Systems  Constitutional: Negative for chills and fever.  Respiratory: Negative for shortness of breath.   Cardiovascular: Negative for chest pain and leg swelling.  Gastrointestinal: Negative for abdominal pain, constipation, diarrhea, nausea and vomiting.  Genitourinary: Negative for dysuria and hematuria.  Musculoskeletal: Positive for myalgias. Negative for joint swelling.  Skin: Negative for color change.  Allergic/Immunologic: Negative for immunocompromised state.  Neurological: Positive for weakness (L foot drop, unchanged) and numbness (L foot/lower leg, unchanged).  Psychiatric/Behavioral: Negative for confusion.   All other systems reviewed and are negative for acute change except as noted in the HPI.    Physical Exam Updated Vital Signs BP 114/73 (BP Location: Left Arm)   Pulse 80   Temp 98.7 F (37.1 C) (Oral)   Resp 12   LMP  (LMP Unknown)   SpO2 100%   Physical Exam  Constitutional: She is oriented to person, place, and time. Vital signs are normal. She appears well-developed and well-nourished.  Non-toxic appearance. No distress.  Afebrile, nontoxic, NAD although appears uncomfortable but is easily calmed down  HENT:  Head: Normocephalic and atraumatic.  Mouth/Throat: Oropharynx is clear and moist and mucous membranes are normal.  Eyes: Conjunctivae and EOM are normal. Right eye exhibits no discharge. Left eye exhibits no discharge.  Neck: Normal range of motion. Neck supple.  Cardiovascular: Normal rate, regular rhythm, normal heart sounds and intact  distal pulses. Exam reveals no gallop and no friction rub.  No murmur heard. Pulmonary/Chest: Effort normal and breath sounds normal. No respiratory distress. She has no decreased breath sounds. She has no wheezes. She has no rhonchi. She has no rales.  Abdominal: Soft. Normal appearance and bowel sounds are normal. She exhibits no distension. There is no tenderness. There is no rigidity, no rebound, no guarding, no CVA tenderness, no tenderness at McBurney's point and negative Murphy's sign.  Musculoskeletal: Normal range of motion.       Left hip: She exhibits tenderness. She exhibits normal range of motion, normal strength, no bony tenderness, no swelling, no crepitus and  no deformity.       Left lower leg: She exhibits tenderness. She exhibits no bony tenderness, no swelling, no edema, no deformity and no laceration.  Diffuse TTP to L lower leg/foot/ankle area, no focal bony/joint line TTP, light touch of L leg causes pain in L foot/ankle region, FROM intact at L knee but diminished AROM of L ankle due to L foot drop. No swelling or bruising, no erythema or warmth, no crepitus or deformity. Sensation diminished in L foot/lower leg which she states is chronic from her injury a month ago. L hip with FROM intact, no focal bony/joint line TTP but mild TTP over greater trochanteric region. Skin graft site on L upper thigh without evidence of infection. Strength grossly intact in L hip/knee but L foot/ankle diminished with dorsiflexion and plantarflexion. Distal pulses intact, soft compartments. No midline spinal TTP.  Neurological: She is alert and oriented to person, place, and time. She has normal strength. No sensory deficit.  Skin: Skin is warm, dry and intact. No rash noted.  Psychiatric: She has a normal mood and affect.  Nursing note and vitals reviewed.    ED Treatments / Results  Labs (all labs ordered are listed, but only abnormal results are displayed) Labs Reviewed - No data to  display  EKG None  Radiology No results found.  Procedures Procedures (including critical care time)  Medications Ordered in ED Medications - No data to display   Initial Impression / Assessment and Plan / ED Course  I have reviewed the triage vital signs and the nursing notes.  Pertinent labs & imaging results that were available during my care of the patient were reviewed by me and considered in my medical decision making (see chart for details).     47 y.o. female here with L leg pain after recent trauma; was admitted at [facility] 05/29/18 for traumatic rhabdo after multiple falls related to EtOH use, ended up having LLE nerve injury resulting in L foot drop, also had L forearm compartment syndrome and had multiple surgeries for this. States she has unrelenting pain in her L knee/lower leg, feels like it's on fire, also with some L hip pain at the greater trochanter region. Was seen at ortho office 06/28/18 and given gabapentin, which she started. Already on tramadol. Has referral in for pain management. No acute injury or trauma, just having pain. On exam, L foot drop noted, sensation diminished in L foot/lower leg, light touch of L leg causes pain in L foot/ankle region; no swelling/bruising/skin changes, distal pulses intact; no focal bony/joint line TTP; mild greater trochanteric TTP on L hip but no focal bony/joint line TTP otherwise. No midline spinal TTP. Overall, sounds like neuropathic pain. Doubt need for labs/imaging. Explained that gabapentin takes longer to work than just a few days, advised continuation of her home meds, will provide lidoderm patch to see if this will help. For her hip, I suspect bursitis from her prior hip hematoma, advised heat use. Discussed use of plantar fasciitis splint to provide support of the L foot until she gets an AFO made. F/up with ortho in 1-2wks for ongoing management of her chronic pain. I explained the diagnosis and have given explicit precautions to  return to the ER including for any other new or worsening symptoms. The patient understands and accepts the medical plan as it's been dictated and I have answered their questions. Discharge instructions concerning home care and prescriptions have been given. The patient is STABLE and is discharged  to home in good condition.    Final Clinical Impressions(s) / ED Diagnoses   Final diagnoses:  Left leg pain  Neuropathic pain  Trochanteric bursitis of left hip  Left foot drop    ED Discharge Orders         Ordered    lidocaine (LIDODERM) 5 %  Every 24 hours     06/30/18 [address] Jt Brabec, [place], PA-C 06/30/18 [place], [place], DO 06/30/18 2039

## 2018-06-30 NOTE — ED Triage Notes (Signed)
Pt in c/o uncontrollable pain after being home from an admission at [facility], states she went for a follow up yesterday and had her staples removed from her arm and pain significantly increased

## 2018-07-05 ENCOUNTER — Ambulatory Visit: Payer: Medicaid Other | Admitting: Occupational Therapy

## 2018-07-05 DIAGNOSIS — M25622 Stiffness of left elbow, not elsewhere classified: Secondary | ICD-10-CM

## 2018-07-05 DIAGNOSIS — M25632 Stiffness of left wrist, not elsewhere classified: Secondary | ICD-10-CM

## 2018-07-05 DIAGNOSIS — M79602 Pain in left arm: Secondary | ICD-10-CM

## 2018-07-05 DIAGNOSIS — M25642 Stiffness of left hand, not elsewhere classified: Secondary | ICD-10-CM

## 2018-07-05 DIAGNOSIS — M6281 Muscle weakness (generalized): Secondary | ICD-10-CM

## 2018-07-05 NOTE — Therapy (Signed)
[facility] [address] [place], [place], [address] Phone: [phone]   Fax:  [phone]  Occupational Therapy Treatment  Patient Details  Name: [name] MRN: [id number] Date of Birth: 1971/01/25 Referring Provider: Jasmine Pang   Encounter Date: 07/05/2018  OT End of Session - 07/05/18 0951    Visit Number  2    Number of Visits  17    Date for OT Re-Evaluation  08/27/18    Authorization Type  MCD     Authorization Time Period  Approved 12 visits from 07/05/18 - 08/15/18    Authorization - Visit Number  1    Authorization - Number of Visits  12    OT Start Time  0800    OT Stop Time  0845    OT Time Calculation (min)  45 min    Activity Tolerance  Patient tolerated treatment well    Behavior During Therapy  [facility] for tasks assessed/performed       No past medical history on file.  Past Surgical History:  Procedure Laterality Date  . CESAREAN SECTION     fetal distress    There were no vitals filed for this visit.  Subjective Assessment - 07/05/18 0808    Subjective   My pain is a little better in my leg. I really haven't been doing my hand ex's as much as I should. 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 PMH: Bipolar, cocaine abuse    Limitations  NWB LUE, Fall risk, pt/family to get clarifications on any other restrictions/precautions LUE    Currently in Pain?  Yes    Pain Score  4     Pain Location  Leg    Pain Orientation  Left    Pain Descriptors / Indicators  Burning;Stabbing    Pain Type  Acute pain    Pain Frequency  Constant    Aggravating Factors   nothing    Pain Relieving Factors  gabapentin and pain patch                   OT Treatments/Exercises (OP) - 07/05/18  0001      ADLs   ADL Comments  Pt reports forgetting to bring info from last MD office and could not remember what he said. Therapist called pt's brother to clarify what MD had said re: any restrictions LUE. Brother was present for MD appointment and said there were no restrictions for ROM Lt forearm, wrist and hand d/t skin graft.       Exercises   Exercises  --   see below     Reviewed previously issued HEP for Lt hand and wrist (provided from [facility]). Therapist stressed importance of doing these t/o day. Pt instructed that she would have to assist w/ other hand d/t limited active motion. Therapist also verbally added elbow extension stretch, place and hold stretches as able in each of the positions for fingers, and active PIP extension w/ MP's held in flexion. Therapist spent majority of session reviewing ex's, and performing P/ROM, and place and hold ex's as able in each of the positions. Pt also instructed to keep shoulder moving to prevent stiffness/pain.          OT Short Term Goals -  06/27/18 1406      OT SHORT TERM GOAL #1   Title  Independent with HEP to increase ROM Lt hand/wrist - 07/28/18    Baseline  dependent    Time  4    Period  Weeks    Status  New      OT SHORT TERM GOAL #2   Title  Pt to demo full composite flexion Lt hand for grasping     Baseline  90%    Time  4    Period  Weeks    Status  New      OT SHORT TERM GOAL #3   Title  Pt to demo approx. 50% finger extension at PIP joints Lt hand in prep for grasping and for releasing objects    Baseline  none (only at MP joints)     Time  4    Period  Weeks    Status  New      OT SHORT TERM GOAL #4   Title  Pt to demo 90% elbow extension LUE for reaching activities    Baseline  75%    Time  4    Period  Weeks    Status  New      OT SHORT TERM GOAL #5   Title  Pt to don/doff socks using one handed technique and lace shoes w/ A/E at mod I level    Baseline  dependent    Time  4    Period  Weeks     Status  New        OT Long Term Goals - 06/27/18 1410      OT LONG TERM GOAL #1   Title  Pt to be independent with updated HEP - 08/27/18    Baseline  Dependent at this time    Time  8    Period  Weeks    Status  New      OT LONG TERM GOAL #2   Title  Pt to demo 90% active finger extension Lt hand for releasing objects consistently     Baseline  no active PIP extension    Time  8    Period  Weeks    Status  New      OT LONG TERM GOAL #3   Title  Pt to demo 20 lbs grip strength Lt hand to sufficiently hold and lift weighted objects    Baseline  no grip strength    Time  8    Period  Weeks    Status  New      OT LONG TERM GOAL #4   Title  Pt to demo Lt wrist extension to 30* or greater for functional tasks     Baseline  wrist ext only to neutral    Time  8    Period  Weeks    Status  New      OT LONG TERM GOAL #5   Title  Pt to return to simple cooking tasks and light cleaning tasks at mod I level    Baseline  dependent    Time  8    Period  Weeks    Status  New            Plan - 07/05/18 5643    Clinical Impression Statement  Pt with better overall pain today and able to tolerate therapy better today. Pt w/ limited active use of Lt hand and only has gross finger flexion to  approx. 80%, and extension only at MP joints.     Occupational Profile and client history currently impacting functional performance  PMH: Bipolar, cocaine abuse    Occupational performance deficits (Please refer to evaluation for details):  ADL's;IADL's;Social Participation    Rehab Potential  Fair    Current Impairments/barriers affecting progress:  severity of deficits, recent skin graft, ? carryover of recommendations, pain    OT Frequency  2x / week    OT Duration  8 weeks    OT Treatment/Interventions  Self-care/ADL training;Moist Heat;Fluidtherapy;DME and/or AE instruction;Splinting;Compression bandaging;Therapeutic activities;Ultrasound;Therapeutic exercise;Scar mobilization;Coping  strategies training;Functional Mobility Training;Passive range of motion;Electrical Stimulation;Paraffin;Manual Therapy;Patient/family education    Plan  Pt to bring in splint next session to adjust prn or make new splint prn. Pt also to bring in socks to show one handed technique for donning. Continue ROM LUE    Consulted and Agree with Plan of Care  Patient       Patient will benefit from skilled therapeutic intervention in order to improve the following deficits and impairments:  Decreased coordination, Decreased range of motion, Increased edema, Impaired sensation, Increased fascial restrictions, Decreased skin integrity, Decreased knowledge of precautions, Impaired UE functional use, Pain, Decreased balance, Decreased mobility, Decreased strength, Impaired perceived functional ability  Visit Diagnosis: Stiffness of left hand, not elsewhere classified  Stiffness of left wrist, not elsewhere classified  Stiffness of left elbow, not elsewhere classified  Muscle weakness (generalized)  Pain in left arm    Problem List Patient Active Problem List   Diagnosis Date Noted  . Smoker 05/06/2016  . Bipolar disorder ([place]) 05/06/2016    Carey Bullocks, OTR/L 07/05/2018, 9:55 AM  [facility] [address] [place], [place], [address] Phone: [phone]   Fax:  [phone]  Name: [name] MRN: [id number] Date of Birth: 01/27/1971

## 2018-07-07 ENCOUNTER — Ambulatory Visit: Payer: Medicaid Other | Admitting: Physical Therapy

## 2018-07-13 ENCOUNTER — Ambulatory Visit: Payer: Medicaid Other | Admitting: Physical Therapy

## 2018-07-13 ENCOUNTER — Ambulatory Visit: Payer: Medicaid Other | Admitting: Occupational Therapy

## 2018-07-13 ENCOUNTER — Encounter: Payer: Self-pay | Admitting: Physical Therapy

## 2018-07-13 DIAGNOSIS — R2681 Unsteadiness on feet: Secondary | ICD-10-CM

## 2018-07-13 DIAGNOSIS — M25642 Stiffness of left hand, not elsewhere classified: Secondary | ICD-10-CM

## 2018-07-13 DIAGNOSIS — M25632 Stiffness of left wrist, not elsewhere classified: Secondary | ICD-10-CM

## 2018-07-13 DIAGNOSIS — M6281 Muscle weakness (generalized): Secondary | ICD-10-CM

## 2018-07-13 DIAGNOSIS — R2689 Other abnormalities of gait and mobility: Secondary | ICD-10-CM

## 2018-07-13 NOTE — Patient Instructions (Signed)
Your Splint This splint should initially be fitted by a healthcare practitioner.  The healthcare practitioner is responsible for providing wearing instructions and precautions to the patient, other healthcare practitioners and care provider involved in the patient's care.  This splint was custom made for you. Please read the following instructions to learn about wearing and caring for your splint.  Precautions Should your splint cause any of the following problems, remove the splint immediately and contact your therapist/physician.  Swelling  Severe Pain  Pressure Areas  Stiffness  Numbness  Do not wear your splint while operating machinery unless it has been fabricated for that purpose.  When To Wear Your Splint Where your splint according to your therapist/physician instructions.  Begin by wearing 2 hours during the day, then remove for an hour and check skin for irritation/pressure areas. If not problems, increase by an hour (3 hours). Gradually increase up to 5 hours during the day. If no problems arise, then switch to wearing at night time.   Care and Cleaning of Your Splint 1. Keep your splint away from open flames. 2. Your splint will lose its shape in temperatures over 135 degrees Farenheit, ( in car windows, near radiators, ovens or in hot water).  Never make any adjustments to your splint, if the splint needs adjusting remove it and make an appointment to see your therapist. 3. Your splint  may be cleaned with rubbing alcohol.   Do not immerse in hot water over 135 degrees Farenheit.

## 2018-07-13 NOTE — Therapy (Signed)
[facility] [address] [place], [place], [address] Phone: [phone]   Fax:  [phone]  Physical Therapy Treatment  Patient Details  Name: [name] MRN: [id number] Date of Birth: 11-08-1971 Referring Provider: Jasmine Pang   Encounter Date: 07/13/2018  PT End of Session - 07/13/18 0941    Visit Number  2    Number of Visits  8   eval plus 7 visits   Date for PT Re-Evaluation  07/27/18   end of 1st auth period   Authorization Type  Medicaid;     Authorization Time Period  07/07/18 through 07/27/18- 3 visits approved    Authorization - Visit Number  1    Authorization - Number of Visits  3    PT Start Time  0935    PT Stop Time  1015    PT Time Calculation (min)  40 min    Equipment Utilized During Treatment  Gait belt    Activity Tolerance  Patient limited by pain    Behavior During Therapy  Restless;Anxious       History reviewed. No pertinent past medical history.  Past Surgical History:  Procedure Laterality Date  . CESAREAN SECTION     fetal distress    There were no vitals filed for this visit.  Subjective Assessment - 07/13/18 0934    Subjective  Reporting increased pain in left LE/foot over the past week/week end, felt "like lightening bolts are going through it". Out of pain medications at this time, taking the Gabapentin and Tramadol. Better today with just aching/throbbing.     Pertinent History  Bipolar disorder; polysubstance abuse     Diagnostic tests  MRI Lumbar-Anterolisthesis of L5 on S1 resulting in moderately severe foraminal stenosis on the left and moderate stenosis on the right, which results in mass effect on the left greater than right exiting L5 nerve root. Moderate spinal canal stenosis.    Patient Stated Goals  I'd like to be able to walk without a device.    Currently in Pain?  Yes    Pain Location  Leg    Pain Orientation  Left    Pain Descriptors / Indicators   Aching    Pain Type  Acute pain    Pain Onset  More than a month ago    Pain Frequency  Constant    Aggravating Factors   placing bare foot down on floor, up walking around alot, touch    Pain Relieving Factors  medication, other wise not much else    Effect of Pain on Daily Activities  can't get comfortable. limits activity      treatment: issued the following to HEP today with cues on ex form/technique.             [facility] Adult PT Treatment/Exercise - 07/13/18 0950      Transfers   Transfers  Sit to Stand;Stand to Sit    Sit to Stand  5: Supervision    Stand to Sit  5: Supervision      Ambulation/Gait   Ambulation/Gait  Yes    Ambulation/Gait Assistance  4: Min guard    Ambulation/Gait Assistance Details  donned clinic's posterior Ottobock brace for gait in session. 1st lap with small base quad cane and 2cd lap with straight cane with rubber quad tip. min guard to supervision with both devices. Pt did have impoved gait mechanics with use of brace with improved foot clearacne and step/stride length.  Ambulation Distance (Feet)  115 Feet   x2   Assistive device  Small based quad cane;Straight cane   straight cane with rubber quad tip   Gait Pattern  Step-through pattern;Decreased step length - right;Decreased step length - left;Decreased dorsiflexion - left;Left steppage;Left foot flat;Poor foot clearance - left    Ambulation Surface  Level;Indoor         PT Education - 07/13/18 1136    Education Details  HEP for LE strengthening, gait with small base quad cane vs straight cane with rubber quad tip with posterior Ottobock brace    Person(s) Educated  Patient    Methods  Explanation;Demonstration;Verbal cues;Handout    Comprehension  Verbalized understanding;Returned demonstration;Verbal cues required;Need further instruction       PT Short Term Goals - 06/27/18 1347      PT SHORT TERM GOAL #1   Title  Patient will be independent with  basic HEP for LLE strengthening, stretching, and balance/gait.  (Target for all STGs TBD by Medicaid--by 3rd treatment visit)    Baseline  No HEP from inpatient rehab    Time  3    Period  Weeks    Status  New      PT SHORT TERM GOAL #2   Title  Patient will ambulate with LRAD and AFO (vs clinic AFO) with velocity >=1.81 ft/sec to demonstrate lesser fall risk.     Baseline  06/27/18 with SBQC and no AFO 1.23 ft/sec    Time  3    Period  Weeks    Status  New      PT SHORT TERM GOAL #3   Title  Patient will demonstrate incr strength by reducing 5 times sit to stand to <10 seconds.    Baseline  06/27/18 15.5 seconds (>12 sec indicates higher fall risk)    Time  3    Period  Weeks    Status  New      PT SHORT TERM GOAL #4   Title  Patient will ambulate 200 ft over level indoor surface in <=2 minutes    Baseline  06/27/18 Required standing rest break after 50 feet due to pain.     Time  3    Period  Weeks    Status  New        PT Long Term Goals - 06/27/18 1356      PT LONG TERM GOAL #1   Title  Patient will be independent with updated HEP (Target all LTGs by 7th visit--date TBD by Medicaid).     Baseline  06/27/18 no HEP     Time  7    Period  Weeks    Status  New      PT LONG TERM GOAL #2   Title  Patient will ambulate with LRAD and AFO (vs clinic AFO) with velocity >=2.0 ft/sec to demonstrate lesser fall risk.     Baseline  06/27/18 with SBQC and no AFO 1.23 ft/sec    Time  7    Period  Weeks    Status  New      PT LONG TERM GOAL #3   Title  Patient will ambulate 200 ft over level indoor surface in <=1 minute, 45 seconds    Baseline  06/27/18 Required standing rest break after 50 feet due to pain.     Time  7    Period  Weeks    Status  New      PT LONG TERM  GOAL #4   Title  Patient will ascend/descend 2 steps no rail with LRAD vs no device modified independent    Baseline  8/5 uses SBQC and minguard    Time  7    Period  Weeks    Status  New            Plan -  07/13/18 0454    Clinical Impression Statement  Today's skilled session focused on establishment of an HEP for LE stretching/strengthening and gait training with posterior AFO with small base quad cane and straight cane. Pt with improved gait mechanics with use of brace, however significantly decreased tolerance to donning/doffing brace due to increased LE pain today. Pt is progressing slowly and should benefit from continued PT to progress toward unmet goals.                            Rehab Potential  Good    Clinical Impairments Affecting Rehab Potential  Patient's pain tolerance;     PT Frequency  1x / week    PT Duration  Other (comment)   7 weeks   PT Treatment/Interventions  ADLs/Self Care Home Management;Electrical Stimulation;Gait training;DME Instruction;Stair training;Functional mobility training;Therapeutic activities;Therapeutic exercise;Balance training;Neuromuscular re-education;Orthotic Fit/Training;Patient/family education;Scar mobilization;Passive range of motion    PT Next Visit Plan  continue with gait training with posterior brace and LRAD. continue to work on LE stretching and strengthening, ? use of modalities for pain control of left LE.     PT Home Exercise Plan  seated or standing left gastroc stretch    Consulted and Agree with Plan of Care  Patient    Family Member Consulted  --       Patient will benefit from skilled therapeutic intervention in order to improve the following deficits and impairments:  Abnormal gait, Decreased activity tolerance, Decreased balance, Decreased mobility, Decreased knowledge of use of DME, Decreased range of motion, Decreased skin integrity, Decreased strength, Impaired flexibility, Impaired sensation, Impaired UE functional use, Pain  Visit Diagnosis: Muscle weakness (generalized)  Other abnormalities of gait and mobility  Unsteadiness on feet     Problem List Patient Active Problem List   Diagnosis Date Noted  . Smoker  05/06/2016  . Bipolar disorder ([place]) 05/06/2016    Willow Ora, PTA, [facility] [address], [place], [place] [address] [phone] 07/14/18, 12:38 PM   Name: ELLICE BOULTINGHOUSE MRN: [id number] Date of Birth: 07-01-1971

## 2018-07-13 NOTE — Patient Instructions (Addendum)
Access Code: [email or web address]  URL: [email or web address]  Date: 07/13/2018  Prepared by: Willow Ora   Exercises  Supine Hamstring Stretch with Strap - 3 reps - 1 sets - 30 hold - 1x daily - 5x weekly  Single Leg Bridge - 10 reps - 1 sets - 5 hold - 1x daily - 5x weekly  Sit to Stand - 10 reps - 1 sets - 1x daily - 5x weekly

## 2018-07-13 NOTE — Therapy (Signed)
[facility] [address] [place], [place], [address] Phone: [phone]   Fax:  [phone]  Occupational Therapy Treatment  Patient Details  Name: [name] MRN: [id number] Date of Birth: 1971-02-06 Referring Provider: Jasmine Pang   Encounter Date: 07/13/2018  OT End of Session - 07/13/18 0949    Visit Number  3    Number of Visits  17    Date for OT Re-Evaluation  08/27/18    Authorization Type  MCD     Authorization Time Period  Approved 12 visits from 07/05/18 - 08/15/18    Authorization - Visit Number  2    Authorization - Number of Visits  12    OT Start Time  0850    OT Stop Time  0935    OT Time Calculation (min)  45 min    Activity Tolerance  Patient tolerated treatment well;Patient limited by pain    Behavior During Therapy  [facility] for tasks assessed/performed       No past medical history on file.  Past Surgical History:  Procedure Laterality Date  . CESAREAN SECTION     fetal distress    There were no vitals filed for this visit.  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 PMH: Bipolar, cocaine abuse    Limitations  NWB LUE, Fall risk, pt/family to get clarifications on any other restrictions/precautions LUE    Currently in Pain?  Yes    Pain Score  10-Worst pain ever    Pain Location  Leg    Pain Orientation  Left    Pain Descriptors / Indicators  Aching;Throbbing    Pain Type  Acute pain    Pain Onset  More than a month ago    Pain Frequency  Constant    Aggravating Factors   nothing    Pain Relieving Factors  nothing                   OT Treatments/Exercises (OP) - 07/13/18 0001      ADLs   LB Dressing  Practiced donning/doffing Lt shoe and sock Mod I  level, however unable to tie shoe    ADL Comments  Encouraged pt to change forearm dressing and tensogrip stockinette daily for better hygiene.       Splinting   Splinting  Pt brought in current splint, however way to big and no longer fits properly. Fabricated and fitted new resting hand splint for pm wear and educated in wear and care. Fabricated to increase MP flexion and increase PIP extension.              OT Education - 07/13/18 [phone]    Education Details  splint wear and care    Person(s) Educated  Patient    Methods  Explanation;Demonstration;Handout    Comprehension  Verbalized understanding       OT Short Term Goals - 07/13/18 0950      OT SHORT TERM GOAL #1   Title  Independent with HEP to increase ROM Lt hand/wrist - 07/28/18    Baseline  dependent    Time  4    Period  Weeks    Status  Achieved      OT SHORT TERM GOAL #2   Title  Pt to demo full composite flexion Lt hand for grasping     Baseline  90%    Time  4    Period  Weeks    Status  New      OT SHORT TERM GOAL #3   Title  Pt to demo approx. 50% finger extension at PIP joints Lt hand in prep for grasping and for releasing objects    Baseline  none (only at MP joints)     Time  4    Period  Weeks    Status  New      OT SHORT TERM GOAL #4   Title  Pt to demo 90% elbow extension LUE for reaching activities    Baseline  75%    Time  4    Period  Weeks    Status  New      OT SHORT TERM GOAL #5   Title  Pt to don/doff socks using one handed technique and lace shoes w/ A/E at mod I level    Baseline  dependent    Time  4    Period  Weeks    Status  On-going        OT Long Term Goals - 06/27/18 1410      OT LONG TERM GOAL #1   Title  Pt to be independent with updated HEP - 08/27/18    Baseline  Dependent at this time    Time  8    Period  Weeks    Status  New      OT LONG TERM GOAL #2   Title  Pt to demo 90% active finger extension Lt hand for releasing objects consistently     Baseline   no active PIP extension    Time  8    Period  Weeks    Status  New      OT LONG TERM GOAL #3   Title  Pt to demo 20 lbs grip strength Lt hand to sufficiently hold and lift weighted objects    Baseline  no grip strength    Time  8    Period  Weeks    Status  New      OT LONG TERM GOAL #4   Title  Pt to demo Lt wrist extension to 30* or greater for functional tasks     Baseline  wrist ext only to neutral    Time  8    Period  Weeks    Status  New      OT LONG TERM GOAL #5   Title  Pt to return to simple cooking tasks and light cleaning tasks at mod I level    Baseline  dependent    Time  8    Period  Weeks    Status  New            Plan - 07/13/18 0950    Clinical Impression Statement  Pt limited by left leg pain, and poor carryover of medical care.     Occupational Profile and client history currently impacting functional performance  PMH: Bipolar, cocaine abuse    Occupational performance deficits (Please refer to evaluation for details):  ADL's;IADL's;Social Participation    Rehab Potential  Fair    Current Impairments/barriers affecting progress:  severity of deficits, recent skin graft, ? carryover of recommendations, pain    OT Frequency  2x / week    OT Duration  8 weeks    OT  Treatment/Interventions  Self-care/ADL training;Moist Heat;Fluidtherapy;DME and/or AE instruction;Splinting;Compression bandaging;Therapeutic activities;Ultrasound;Therapeutic exercise;Scar mobilization;Coping strategies training;Functional Mobility Training;Passive range of motion;Electrical Stimulation;Paraffin;Manual Therapy;Patient/family education    Plan  continue progress towards STG's    Consulted and Agree with Plan of Care  Patient       Patient will benefit from skilled therapeutic intervention in order to improve the following deficits and impairments:  Decreased coordination, Decreased range of motion, Increased edema, Impaired sensation, Increased fascial restrictions, Decreased  skin integrity, Decreased knowledge of precautions, Impaired UE functional use, Pain, Decreased balance, Decreased mobility, Decreased strength, Impaired perceived functional ability  Visit Diagnosis: Stiffness of left hand, not elsewhere classified  Stiffness of left wrist, not elsewhere classified    Problem List Patient Active Problem List   Diagnosis Date Noted  . Smoker 05/06/2016  . Bipolar disorder ([place]) 05/06/2016    Carey Bullocks, OTR/L 07/13/2018, 11:10 AM  [facility] [address] [place], [place], [address] Phone: [phone]   Fax:  [phone]  Name: [name] MRN: [id number] Date of Birth: November 14, 1971

## 2018-07-18 ENCOUNTER — Telehealth: Payer: Self-pay

## 2018-07-18 ENCOUNTER — Ambulatory Visit: Payer: Medicaid Other | Admitting: Physical Therapy

## 2018-07-18 ENCOUNTER — Ambulatory Visit: Payer: Medicaid Other | Admitting: Occupational Therapy

## 2018-07-18 NOTE — Telephone Encounter (Signed)
Called pt's mobile number first, but unable to leave message d/t voicemail not being set up yet. Called pt's brother to inform him of her appointments today and remind him of her next therapy appointment on Wednesday 07/20/18.

## 2018-07-20 ENCOUNTER — Ambulatory Visit: Payer: Medicaid Other | Admitting: Occupational Therapy

## 2018-07-20 DIAGNOSIS — M79602 Pain in left arm: Secondary | ICD-10-CM

## 2018-07-20 DIAGNOSIS — M25632 Stiffness of left wrist, not elsewhere classified: Secondary | ICD-10-CM

## 2018-07-20 DIAGNOSIS — M25642 Stiffness of left hand, not elsewhere classified: Secondary | ICD-10-CM

## 2018-07-20 DIAGNOSIS — M6281 Muscle weakness (generalized): Secondary | ICD-10-CM

## 2018-07-20 NOTE — Therapy (Signed)
[facility] [address] [place], [place], [address] Phone: [phone]   Fax:  [phone]  Occupational Therapy Treatment  Patient Details  Name: [name] MRN: [id number] Date of Birth: November 11, 1971 Referring Provider: Jasmine Pang   Encounter Date: 07/20/2018  OT End of Session - 07/20/18 1204    Visit Number  4    Number of Visits  17    Date for OT Re-Evaluation  08/27/18    Authorization Type  MCD     Authorization Time Period  Approved 12 visits from 07/05/18 - 08/15/18    Authorization - Visit Number  3    Authorization - Number of Visits  12    OT Start Time  2992    OT Stop Time  1145    OT Time Calculation (min)  40 min    Activity Tolerance  Patient limited by pain    Behavior During Therapy  [employer] for tasks assessed/performed       No past medical history on file.  Past Surgical History:  Procedure Laterality Date  . CESAREAN SECTION     fetal distress    There were no vitals filed for this visit.  Subjective Assessment - 07/20/18 0900    Subjective   I'm borrowing someone else's gabapentin. 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 PMH: Bipolar, cocaine abuse    Limitations  NWB LUE, Fall risk, pt/family to get clarifications on any other restrictions/precautions LUE    Currently in Pain?  Yes    Pain Score  9     Pain Location  Leg    Pain Orientation  Left    Pain Type  Acute pain    Pain Onset  More than a month ago    Pain Frequency  Constant    Aggravating Factors   anything    Pain Relieving Factors  medication       Treatment:  Assessed splint and fit - still fits well, however pt reports not consistently using secondary to difficulty donning. Practiced donning splint. Pt also issued more  tensogrip and reviewed hygiene care/skin graft care. Pt instructed to clean tensogrip and change daily. Pt now has at least 3 tensogrip stockinette to alternate b/t when one is drying.  Pt shown shoe buttons, how to use, and where to purchase. Pt practiced using (on Rt side). Therapist issued handout on shoe buttons Continued passive, AA/ROM as able for Lt wrist and hand. Emphasis placed on keeping MP's in flexion (w/ assist from other hand) for full composite flexion, and active PIP extension. Pt unable to perform active PIP extension unless MP's blocked in flexion. Also, emphasized thumb motion as able (passive followed by place and hold, and active)                      OT Short Term Goals - 07/13/18 0950      OT SHORT TERM GOAL #1   Title  Independent with HEP to increase ROM Lt hand/wrist - 07/28/18    Baseline  dependent    Time  4    Period  Weeks    Status  Achieved      OT SHORT TERM GOAL #2   Title  Pt to demo full composite flexion Lt  hand for grasping     Baseline  90%    Time  4    Period  Weeks    Status  New      OT SHORT TERM GOAL #3   Title  Pt to demo approx. 50% finger extension at PIP joints Lt hand in prep for grasping and for releasing objects    Baseline  none (only at MP joints)     Time  4    Period  Weeks    Status  New      OT SHORT TERM GOAL #4   Title  Pt to demo 90% elbow extension LUE for reaching activities    Baseline  75%    Time  4    Period  Weeks    Status  New      OT SHORT TERM GOAL #5   Title  Pt to don/doff socks using one handed technique and lace shoes w/ A/E at mod I level    Baseline  dependent    Time  4    Period  Weeks    Status  On-going        OT Long Term Goals - 06/27/18 1410      OT LONG TERM GOAL #1   Title  Pt to be independent with updated HEP - 08/27/18    Baseline  Dependent at this time    Time  8    Period  Weeks    Status  New      OT LONG TERM GOAL #2   Title  Pt to demo 90% active  finger extension Lt hand for releasing objects consistently     Baseline  no active PIP extension    Time  8    Period  Weeks    Status  New      OT LONG TERM GOAL #3   Title  Pt to demo 20 lbs grip strength Lt hand to sufficiently hold and lift weighted objects    Baseline  no grip strength    Time  8    Period  Weeks    Status  New      OT LONG TERM GOAL #4   Title  Pt to demo Lt wrist extension to 30* or greater for functional tasks     Baseline  wrist ext only to neutral    Time  8    Period  Weeks    Status  New      OT LONG TERM GOAL #5   Title  Pt to return to simple cooking tasks and light cleaning tasks at mod I level    Baseline  dependent    Time  8    Period  Weeks    Status  New            Plan - 07/20/18 1206    Clinical Impression Statement  Pt limited by pain in leg, and poor carryover of medical care.     Occupational Profile and client history currently impacting functional performance  PMH: Bipolar, cocaine abuse    Occupational performance deficits (Please refer to evaluation for details):  ADL's;IADL's;Social Participation    Rehab Potential  Fair    Current Impairments/barriers affecting progress:  severity of deficits, recent skin graft, ? carryover of recommendations, pain    OT Frequency  2x / week    OT Duration  8 weeks    OT Treatment/Interventions  Self-care/ADL training;Moist Heat;Fluidtherapy;DME and/or AE  instruction;Splinting;Compression bandaging;Therapeutic activities;Ultrasound;Therapeutic exercise;Scar mobilization;Coping strategies training;Functional Mobility Training;Passive range of motion;Electrical Stimulation;Paraffin;Manual Therapy;Patient/family education    Plan  continue progress towards STG's as able    Consulted and Agree with Plan of Care  Patient       Patient will benefit from skilled therapeutic intervention in order to improve the following deficits and impairments:  Decreased coordination, Decreased range of  motion, Increased edema, Impaired sensation, Increased fascial restrictions, Decreased skin integrity, Decreased knowledge of precautions, Impaired UE functional use, Pain, Decreased balance, Decreased mobility, Decreased strength, Impaired perceived functional ability  Visit Diagnosis: Muscle weakness (generalized)  Stiffness of left hand, not elsewhere classified  Stiffness of left wrist, not elsewhere classified  Pain in left arm    Problem List Patient Active Problem List   Diagnosis Date Noted  . Smoker 05/06/2016  . Bipolar disorder ([place]) 05/06/2016    Carey Bullocks, OTR/L 07/20/2018, 12:07 PM  [facility] [address] [place], [place], [address] Phone: [phone]   Fax:  [phone]  Name: ROYELLE HINCHMAN MRN: [id number] Date of Birth: January 11, 1971

## 2018-07-22 ENCOUNTER — Encounter: Payer: Self-pay | Admitting: Physical Therapy

## 2018-07-22 ENCOUNTER — Ambulatory Visit: Payer: Medicaid Other | Admitting: Physical Therapy

## 2018-07-22 DIAGNOSIS — R2681 Unsteadiness on feet: Secondary | ICD-10-CM

## 2018-07-22 DIAGNOSIS — M25642 Stiffness of left hand, not elsewhere classified: Secondary | ICD-10-CM | POA: Diagnosis not present

## 2018-07-22 DIAGNOSIS — R2689 Other abnormalities of gait and mobility: Secondary | ICD-10-CM

## 2018-07-22 DIAGNOSIS — M6281 Muscle weakness (generalized): Secondary | ICD-10-CM

## 2018-07-22 NOTE — Therapy (Signed)
[facility] [address] [place], [place], [address] Phone: [phone]   Fax:  [phone]  Physical Therapy Treatment  Patient Details  Name: [name] MRN: [id number] Date of Birth: 1971/09/25 Referring Provider: Jasmine Pang   Encounter Date: 07/22/2018  PT End of Session - 07/22/18 1308    Visit Number  3    Number of Visits  8   eval plus 7 visits   Date for PT Re-Evaluation  07/27/18   end of 1st auth period   Authorization Type  Medicaid;     Authorization Time Period  07/07/18 through 07/27/18- 3 visits approved    Authorization - Visit Number  2    Authorization - Number of Visits  3    PT Start Time  0818   pt arrived late due to transportation   PT Stop Time  0845    PT Time Calculation (min)  27 min    Equipment Utilized During Treatment  --    Activity Tolerance  Patient limited by pain    Behavior During Therapy  Anxious       History reviewed. No pertinent past medical history.  Past Surgical History:  Procedure Laterality Date  . CESAREAN SECTION     fetal distress    There were no vitals filed for this visit.  Subjective Assessment - 07/22/18 0819    Subjective  Reported her transportation arranged through Medicaid again did not show up for her. She took an Surveyor, mining to get here. Reports she cannot turn in an application for use of SCAT until she gets it signed by her MD. I might get my AFO in the next month--talked to [employer], MCD, and MD. Reports she rolled her left ankle yesterday and it is swollen but does not hurt "but I can't feel anything in that leg."    Pertinent History  Bipolar disorder; polysubstance abuse     Diagnostic tests  MRI Lumbar-Anterolisthesis of L5 on S1 resulting in moderately severe foraminal stenosis on the left and moderate stenosis on the right, which results in mass effect on the left greater than right exiting L5 nerve root. Moderate spinal canal stenosis.     Patient Stated Goals  I'd like to be able to walk without a device.    Currently in Pain?  Yes    Pain Location  Leg    Pain Orientation  Left    Pain Descriptors / Indicators  Aching;Burning    Pain Type  Acute pain    Pain Onset  More than a month ago    Pain Frequency  Constant    Aggravating Factors   anything    Pain Relieving Factors  medication (although has not been able to get a refill)    Effect of Pain on Daily Activities  limits all activity        Treatment- Assessed pt's left ankle as she reports she rolled it and there is edema, no bruising, no point tenderness able to tolerate full PROM and weightbearing. For safety, utilized clinic aircast to help prevent inversion or eversion.   Educated in exercises added to HEP (and the stretch she was given last session as she said she lost the handout and has not done it).   Ambulation with SBQC x 40 ft x 2 with steppage gait LLE. Patient is doing well to clear her left foot despite footdrop. She will still benefit from an AFO to reduce her fall risk.  PT Education - 07/22/18 1307    Education Details  additions to HEP for LE strength and stretching    Person(s) Educated  Patient    Methods  Explanation;Demonstration;Verbal cues;Handout    Comprehension  Verbalized understanding;Returned demonstration;Verbal cues required;Need further instruction       PT Short Term Goals - 06/27/18 1347      PT SHORT TERM GOAL #1   Title  Patient will be independent with basic HEP for LLE strengthening, stretching, and balance/gait.  (Target for all STGs TBD by Medicaid--by 3rd treatment visit)    Baseline  No HEP from inpatient rehab    Time  3    Period  Weeks    Status  New      PT SHORT TERM GOAL #2   Title  Patient will ambulate with LRAD and AFO (vs clinic AFO) with velocity >=1.81 ft/sec to demonstrate lesser fall risk.     Baseline  06/27/18 with SBQC and no AFO 1.23 ft/sec    Time  3     Period  Weeks    Status  New      PT SHORT TERM GOAL #3   Title  Patient will demonstrate incr strength by reducing 5 times sit to stand to <10 seconds.    Baseline  06/27/18 15.5 seconds (>12 sec indicates higher fall risk)    Time  3    Period  Weeks    Status  New      PT SHORT TERM GOAL #4   Title  Patient will ambulate 200 ft over level indoor surface in <=2 minutes    Baseline  06/27/18 Required standing rest break after 50 feet due to pain.     Time  3    Period  Weeks    Status  New        PT Long Term Goals - 06/27/18 1356      PT LONG TERM GOAL #1   Title  Patient will be independent with updated HEP (Target all LTGs by 7th visit--date TBD by Medicaid).     Baseline  06/27/18 no HEP     Time  7    Period  Weeks    Status  New      PT LONG TERM GOAL #2   Title  Patient will ambulate with LRAD and AFO (vs clinic AFO) with velocity >=2.0 ft/sec to demonstrate lesser fall risk.     Baseline  06/27/18 with SBQC and no AFO 1.23 ft/sec    Time  7    Period  Weeks    Status  New      PT LONG TERM GOAL #3   Title  Patient will ambulate 200 ft over level indoor surface in <=1 minute, 45 seconds    Baseline  06/27/18 Required standing rest break after 50 feet due to pain.     Time  7    Period  Weeks    Status  New      PT LONG TERM GOAL #4   Title  Patient will ascend/descend 2 steps no rail with LRAD vs no device modified independent    Baseline  8/5 uses SBQC and minguard    Time  7    Period  Weeks    Status  New            Plan - 07/22/18 1309    Clinical Impression Statement  Limited session due to pt arrived late. Focused on additions  to her HEP for LLE strengthening and stretching. Patient able to return demonstrate correct technique and provided handout with opportunity to ask questions. Emphasized importance of completing HEP and attending PT sessions.     Rehab Potential  Good    Clinical Impairments Affecting Rehab Potential  Patient's pain tolerance;      PT Frequency  1x / week    PT Duration  Other (comment)   7 weeks   PT Treatment/Interventions  ADLs/Self Care Home Management;Electrical Stimulation;Gait training;DME Instruction;Stair training;Functional mobility training;Therapeutic activities;Therapeutic exercise;Balance training;Neuromuscular re-education;Orthotic Fit/Training;Patient/family education;Scar mobilization;Passive range of motion    PT Next Visit Plan  check STGs and decide if ask for more PT appts; continue with gait training with posterior brace and LRAD. continue to work on LE stretching and strengthening, ? use of modalities for pain control of left LE.     PT Home Exercise Plan  seated or standing left gastroc stretch; wall slides, standing knee flexion, hip abdct,     Consulted and Agree with Plan of Care  Patient       Patient will benefit from skilled therapeutic intervention in order to improve the following deficits and impairments:  Abnormal gait, Decreased activity tolerance, Decreased balance, Decreased mobility, Decreased knowledge of use of DME, Decreased range of motion, Decreased skin integrity, Decreased strength, Impaired flexibility, Impaired sensation, Impaired UE functional use, Pain  Visit Diagnosis: Muscle weakness (generalized)  Other abnormalities of gait and mobility  Unsteadiness on feet     Problem List Patient Active Problem List   Diagnosis Date Noted  . Smoker 05/06/2016  . Bipolar disorder ([place]) 05/06/2016    Rexanne Mano, PT 07/22/2018, 1:21 PM  [facility] [address] [place], [place], [address] Phone: [phone]   Fax:  [phone]  Name: [name] MRN: [id number] Date of Birth: 1971-03-14

## 2018-07-26 ENCOUNTER — Ambulatory Visit: Payer: Medicaid Other | Attending: Nurse Practitioner | Admitting: Occupational Therapy

## 2018-07-26 DIAGNOSIS — R29818 Other symptoms and signs involving the nervous system: Secondary | ICD-10-CM | POA: Insufficient documentation

## 2018-07-26 DIAGNOSIS — R2681 Unsteadiness on feet: Secondary | ICD-10-CM | POA: Insufficient documentation

## 2018-07-26 DIAGNOSIS — M79602 Pain in left arm: Secondary | ICD-10-CM | POA: Insufficient documentation

## 2018-07-26 DIAGNOSIS — M6281 Muscle weakness (generalized): Secondary | ICD-10-CM | POA: Diagnosis present

## 2018-07-26 DIAGNOSIS — R278 Other lack of coordination: Secondary | ICD-10-CM | POA: Diagnosis present

## 2018-07-26 DIAGNOSIS — M25632 Stiffness of left wrist, not elsewhere classified: Secondary | ICD-10-CM | POA: Diagnosis present

## 2018-07-26 DIAGNOSIS — R2689 Other abnormalities of gait and mobility: Secondary | ICD-10-CM | POA: Diagnosis present

## 2018-07-26 DIAGNOSIS — M25642 Stiffness of left hand, not elsewhere classified: Secondary | ICD-10-CM | POA: Insufficient documentation

## 2018-07-26 NOTE — Therapy (Signed)
[facility] [address] [place], [place], [address] Phone: [phone]   Fax:  [phone]  Occupational Therapy Treatment  Patient Details  Name: [name] MRN: [id number] Date of Birth: 01-11-1971 Referring Provider: Jasmine Pang   Encounter Date: 07/26/2018  OT End of Session - 07/26/18 1008    Visit Number  5    Number of Visits  17    Date for OT Re-Evaluation  08/27/18    Authorization Type  MCD     Authorization Time Period  Approved 12 visits from 07/05/18 - 08/15/18    Authorization - Visit Number  4    Authorization - Number of Visits  12    OT Start Time  0850    OT Stop Time  0930    OT Time Calculation (min)  40 min    Activity Tolerance  Patient limited by pain    Behavior During Therapy  Anxious       No past medical history on file.  Past Surgical History:  Procedure Laterality Date  . CESAREAN SECTION     fetal distress    There were no vitals filed for this visit.  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 PMH: Bipolar, cocaine abuse    Limitations  NWB LUE, Fall risk, pt/family to get clarifications on any other restrictions/precautions LUE    Currently in Pain?  Yes    Pain Score  7    LT leg - O.T. not addressing      TREATMENT:  Continued working on active PIP extension w/ MP's blocked in flexion. Continued place and hold ex's as able, and full composite flexion ex's Lt hand.  Fabricated and fitted ex splint to wear only with MP flexion ex to keep IP joints in extension. Issued splint and exercise. Pt verbalized understanding.  Attempted estim to dorsal forearm (no contraindications per pt report) for finger extension attempting to get IP extension, but only  resulted in further MP extension, therefore d/c.  Pt encouraged to wear resting hand splint more as pt does not appear to be wearing and reports only wearing for short amount of time.                    OT Education - 07/26/18 [phone]    Education Details  additional HEP, exercise splint wear and care    Person(s) Educated  Patient    Methods  Explanation;Demonstration;Handout    Comprehension  Verbalized understanding;Returned demonstration       OT Short Term Goals - 07/26/18 1009      OT SHORT TERM GOAL #1   Title  Independent with HEP to increase ROM Lt hand/wrist - 07/28/18    Baseline  dependent    Time  4    Period  Weeks    Status  Achieved      OT SHORT TERM GOAL #2   Title  Pt to demo full composite flexion Lt hand for grasping     Baseline  90%    Time  4    Period  Weeks    Status  On-going      OT SHORT TERM GOAL #3   Title  Pt to demo approx. 50% finger extension at PIP joints Lt  hand in prep for grasping and for releasing objects    Baseline  none (only at MP joints)     Time  4    Period  Weeks    Status  On-going      OT SHORT TERM GOAL #4   Title  Pt to demo 90% elbow extension LUE for reaching activities    Baseline  75%    Time  4    Period  Weeks    Status  On-going      OT SHORT TERM GOAL #5   Title  Pt to don/doff socks using one handed technique and lace shoes w/ A/E at mod I level    Baseline  dependent    Time  4    Period  Weeks    Status  Achieved   in clinic w/ A/E       OT Long Term Goals - 06/27/18 1410      OT LONG TERM GOAL #1   Title  Pt to be independent with updated HEP - 08/27/18    Baseline  Dependent at this time    Time  8    Period  Weeks    Status  New      OT LONG TERM GOAL #2   Title  Pt to demo 90% active finger extension Lt hand for releasing objects consistently     Baseline  no active PIP extension    Time  8    Period  Weeks    Status  New      OT LONG TERM GOAL #3   Title  Pt to demo 20  lbs grip strength Lt hand to sufficiently hold and lift weighted objects    Baseline  no grip strength    Time  8    Period  Weeks    Status  New      OT LONG TERM GOAL #4   Title  Pt to demo Lt wrist extension to 30* or greater for functional tasks     Baseline  wrist ext only to neutral    Time  8    Period  Weeks    Status  New      OT LONG TERM GOAL #5   Title  Pt to return to simple cooking tasks and light cleaning tasks at mod I level    Baseline  dependent    Time  8    Period  Weeks    Status  New            Plan - 07/26/18 1009    Clinical Impression Statement  Pt continues to be limited by leg pain. However, pt extremely stiff in hand d/t nerve damage and unable to achieve PIP extension w/o MP's blocked in flexion. Pt also w/ only trace thumb movement    Occupational Profile and client history currently impacting functional performance  PMH: Bipolar, cocaine abuse    Occupational performance deficits (Please refer to evaluation for details):  ADL's;IADL's;Social Participation    Rehab Potential  Fair    Current Impairments/barriers affecting progress:  severity of deficits, recent skin graft, ? carryover of recommendations, pain    OT Frequency  2x / week    OT Duration  8 weeks    OT Treatment/Interventions  Self-care/ADL training;Moist Heat;Fluidtherapy;DME and/or AE instruction;Splinting;Compression bandaging;Therapeutic activities;Ultrasound;Therapeutic exercise;Scar mobilization;Coping strategies training;Functional Mobility Training;Passive range of motion;Electrical Stimulation;Paraffin;Manual Therapy;Patient/family education    Plan  continue progress towards STG's as able,  consider estim for finger flexion at MP's if no contraindications over skin graft       Patient will benefit from skilled therapeutic intervention in order to improve the following deficits and impairments:  Decreased coordination, Decreased range of motion, Increased edema, Impaired  sensation, Increased fascial restrictions, Decreased skin integrity, Decreased knowledge of precautions, Impaired UE functional use, Pain, Decreased balance, Decreased mobility, Decreased strength, Impaired perceived functional ability  Visit Diagnosis: Muscle weakness (generalized)  Stiffness of left hand, not elsewhere classified  Stiffness of left wrist, not elsewhere classified    Problem List Patient Active Problem List   Diagnosis Date Noted  . Smoker 05/06/2016  . Bipolar disorder ([place]) 05/06/2016    Carey Bullocks, OTR/L 07/26/2018, 10:11 AM  [facility] [address] [place], [place], [address] Phone: [phone]   Fax:  [phone]  Name: [name] MRN: [id number] Date of Birth: 06-28-1971

## 2018-07-26 NOTE — Patient Instructions (Signed)
1. MP Flexion (Active)    Bend large knuckles as far as they will go, keeping small finger joints straight with exercise splint on. Repeat __10__ times. Do __6__ sessions per day.   2. PIP Extension (Active Controlled With Wrist and MP Flexion)    Using other hand to hold wrist at _neutral and big knuckles at __90__ flexion, straighten end joints of fingers. Repeat _10___ times. Do _6___ sessions per day.

## 2018-07-27 ENCOUNTER — Ambulatory Visit: Payer: Medicaid Other | Admitting: Physical Therapy

## 2018-07-27 ENCOUNTER — Encounter: Payer: Self-pay | Admitting: Physical Therapy

## 2018-07-27 DIAGNOSIS — M6281 Muscle weakness (generalized): Secondary | ICD-10-CM

## 2018-07-27 DIAGNOSIS — R2689 Other abnormalities of gait and mobility: Secondary | ICD-10-CM

## 2018-07-27 DIAGNOSIS — R2681 Unsteadiness on feet: Secondary | ICD-10-CM

## 2018-07-27 NOTE — Patient Instructions (Signed)
Access Code: [email or web address]  URL: [email or web address]  Date: 07/27/2018  Prepared by: Barry Brunner   Exercises  Supine Hamstring Stretch with Strap - 3 reps - 1 sets - 30 hold - 1x daily - 5x weekly  Single Leg Bridge - 10 reps - 1 sets - 5 hold - 1x daily - 5x weekly  Sit to Stand - 10 reps - 1 sets - 1x daily - 5x weekly  Standing Ankle Dorsiflexion Stretch - 3 reps - 1 sets - 30 seconds hold - 3x daily - 7x weekly  Wall Slide with Posterior Pelvic Tilt - 10 reps - 1 sets - 3-5 seconds hold - 2x daily - 5x weekly  Standing Hip Abduction with Counter Support - 10 reps - 1 sets - 3 seconds hold - 2x daily - 5x weekly  Standing Knee Flexion - 10 reps - 3 sets - 3-5 seconds hold - 2x daily - 5x weekly

## 2018-07-27 NOTE — Therapy (Signed)
[facility] [address] [place], [place], [address] Phone: [phone]   Fax:  [phone]  Physical Therapy Treatment  Patient Details  Name: [name] MRN: [id number] Date of Birth: Mar 25, 1971 Referring Provider: Jasmine Pang   Encounter Date: 07/27/2018  PT End of Session - 07/27/18 1205    Visit Number  4    Number of Visits  8   eval plus 7 visits   Date for PT Re-Evaluation  07/27/18   end of 1st auth period   Authorization Type  Medicaid;     Authorization Time Period  07/07/18 through 07/27/18- 3 visits approved    Authorization - Visit Number  3    Authorization - Number of Visits  3    PT Start Time  9458   late arrival   PT Stop Time  0930    PT Time Calculation (min)  36 min    Activity Tolerance  Patient limited by pain    Behavior During Therapy  Anxious       History reviewed. No pertinent past medical history.  Past Surgical History:  Procedure Laterality Date  . CESAREAN SECTION     fetal distress    There were no vitals filed for this visit.  Subjective Assessment - 07/27/18 1200    Subjective  Very focused on pain medicine new MD prescribed (that she has not yet had filled) as what she has read about it concerns her ("highly addictive" "deadly"). Encouraged pt to discuss her questions with pharmacist or MD. Reports she has had a hard time doing her HEP due to LLE constant pain.     Pertinent History  Bipolar disorder; polysubstance abuse     Diagnostic tests  MRI Lumbar-Anterolisthesis of L5 on S1 resulting in moderately severe foraminal stenosis on the left and moderate stenosis on the right, which results in mass effect on the left greater than right exiting L5 nerve root. Moderate spinal canal stenosis.    Patient Stated Goals  I'd like to be able to walk without a device.    Currently in Pain?  Yes    Pain Score  7     Pain Location  Leg    Pain Orientation  Left    Pain  Descriptors / Indicators  Burning;Cramping;Grimacing    Pain Type  Acute pain    Pain Onset  More than a month ago    Pain Frequency  Constant    Aggravating Factors   any movement or touching foot/toes    Pain Relieving Factors  none                       [facility] Adult PT Treatment/Exercise - 07/27/18 0001      Transfers   Five time sit to stand comments   7.53      Ambulation/Gait   Ambulation Distance (Feet)  200 Feet   2 min 11 sec   Gait velocity  32.8/22.5=1.46      There-ex- To assess STG #1 had pt perform 5-10 reps of each exercise from HEP with pt requiring vc for technique with supine hamstring/ankle DF stretch with strap, standing hip abduction, and standing knee flexion.   Gait training- with clinic posterior support Ott[place]ock AFO and SBQC with vc for left foot placment, heelstrike and rolling over left foot/big toe with continuous stepping (not start-stop). No signs of lateral instability when wearing AFO.  PT Education - 07/27/18 1203    Education Details  use of weight-bearing to assist with decreasing cramping in LLE; needs to put foot on floor and not always draw it up/off the floor due to pain. Discuss concerns re: pain med prescribed with MD or pharmacist.     Terence Lux) Educated  Patient    Methods  Explanation    Comprehension  Verbalized understanding;Returned demonstration       PT Short Term Goals - 07/27/18 1207      PT SHORT TERM GOAL #1   Title  Patient will be independent with basic HEP for LLE strengthening, stretching, and balance/gait.  (Target for all STGs TBD by Medicaid--by 3rd treatment visit)    Baseline  No HEP from inpatient rehab; 07/27/18 requires vc for technique with standing exercises; supine/sitting independent    Time  3    Period  Weeks    Status  Partially Met      PT SHORT TERM GOAL #2   Title  Patient will ambulate with LRAD and AFO (vs clinic AFO) with velocity >=1.81 ft/sec to demonstrate lesser fall  risk.     Baseline  06/27/18 with SBQC and no AFO 1.23 ft/sec; 07/27/18 SBQC and clinic AFO (pt's has not yet been approved by insurance) 1.46 ft/sec    Time  3    Period  Weeks    Status  Partially Met      PT SHORT TERM GOAL #3   Title  Patient will demonstrate incr strength by reducing 5 times sit to stand to <10 seconds.    Baseline  06/27/18 15.5 seconds (>12 sec indicates higher fall risk); 07/27/18 7.53 seconds    Time  3    Period  Weeks    Status  Achieved      PT SHORT TERM GOAL #4   Title  Patient will ambulate 200 ft over level indoor surface in <=2 minutes    Baseline  06/27/18 Required standing rest break after 50 feet due to pain. 07/27/18 200' with SBQC, clinic AFO in 2 min 11 sec with no rest breaks    Time  3    Period  Weeks    Status  Partially Met        PT Long Term Goals - 06/27/18 1356      PT LONG TERM GOAL #1   Title  Patient will be independent with updated HEP (Target all LTGs by 7th visit--date TBD by Medicaid).     Baseline  06/27/18 no HEP     Time  7    Period  Weeks    Status  New      PT LONG TERM GOAL #2   Title  Patient will ambulate with LRAD and AFO (vs clinic AFO) with velocity >=2.0 ft/sec to demonstrate lesser fall risk.     Baseline  06/27/18 with SBQC and no AFO 1.23 ft/sec    Time  7    Period  Weeks    Status  New      PT LONG TERM GOAL #3   Title  Patient will ambulate 200 ft over level indoor surface in <=1 minute, 45 seconds    Baseline  06/27/18 Required standing rest break after 50 feet due to pain.     Time  7    Period  Weeks    Status  New      PT LONG TERM GOAL #4   Title  Patient will ascend/descend 2  steps no rail with LRAD vs no device modified independent    Baseline  8/5 uses SBQC and minguard    Time  7    Period  Weeks    Status  New            Plan - 07/27/18 1211    Clinical Impression Statement  STGs assessed (3rd visit) with pt meeting 1 of 4 goals and partially meeting remaining 3 of 4 goals (pt made progress  in all areas, just not to goal level). She continues to have safety issues with her walking due to LLE weakness (including foot drop) and pain. An AFO has been prescribed for her since 06/13/18 (upon discharge from [facility]) however she continues to wait for insurance approval per orthotist. (She is regularly checking with orthotist office re: status). She recently sprained her left ankle due to ankle rolled due to poor foot placement due to foot drop and lack of sensation in LLE. Patient has made progress despite all these challenges and feel she can benefit from continued PT to decrease fall risk and improve her safety.     Rehab Potential  Good    Clinical Impairments Affecting Rehab Potential  Patient's pain tolerance;     PT Frequency  1x / week    PT Duration  Other (comment)   7 weeks   PT Treatment/Interventions  ADLs/Self Care Home Management;Electrical Stimulation;Gait training;DME Instruction;Stair training;Functional mobility training;Therapeutic activities;Therapeutic exercise;Balance training;Neuromuscular re-education;Orthotic Fit/Training;Patient/family education;Scar mobilization;Passive range of motion    PT Next Visit Plan  continue with gait training with posterior ottobock brace and LRAD vs her SBQC. continue to work on LLE stretching and strengthening; encourage/monitor adherence to HEP    PT Home Exercise Plan  seated or standing left gastroc stretch; wall slides, standing knee flexion, hip abdct, single LE bridge, ankle and hamstring stretch with strap in supine;     Consulted and Agree with Plan of Care  Patient       Patient will benefit from skilled therapeutic intervention in order to improve the following deficits and impairments:  Abnormal gait, Decreased activity tolerance, Decreased balance, Decreased mobility, Decreased knowledge of use of DME, Decreased range of motion, Decreased skin integrity, Decreased strength, Impaired flexibility, Impaired sensation, Impaired UE  functional use, Pain  Visit Diagnosis: Muscle weakness (generalized)  Other abnormalities of gait and mobility  Unsteadiness on feet     Problem List Patient Active Problem List   Diagnosis Date Noted  . Smoker 05/06/2016  . Bipolar disorder ([place]) 05/06/2016    Rexanne Mano, PT 07/27/2018, 12:33 PM  [facility] [address] [place], [place], [address] Phone: [phone]   Fax:  [phone]  Name: SIYA FLURRY MRN: [id number] Date of Birth: Jun 09, 1971

## 2018-07-28 ENCOUNTER — Ambulatory Visit: Payer: Medicaid Other | Admitting: Occupational Therapy

## 2018-07-28 DIAGNOSIS — M25642 Stiffness of left hand, not elsewhere classified: Secondary | ICD-10-CM

## 2018-07-28 DIAGNOSIS — M25632 Stiffness of left wrist, not elsewhere classified: Secondary | ICD-10-CM

## 2018-07-28 DIAGNOSIS — M6281 Muscle weakness (generalized): Secondary | ICD-10-CM

## 2018-07-28 NOTE — Therapy (Signed)
[facility] [address] [place], [place], [address] Phone: [phone]   Fax:  [phone]  Occupational Therapy Treatment  Patient Details  Name: [name] MRN: [id number] Date of Birth: 08-08-1971 Referring Provider: Jasmine Pang   Encounter Date: 07/28/2018  OT End of Session - 07/28/18 1259    Visit Number  6    Number of Visits  17    Date for OT Re-Evaluation  08/27/18    Authorization Type  MCD     Authorization Time Period  Approved 12 visits from 07/05/18 - 08/15/18    Authorization - Visit Number  5    Authorization - Number of Visits  12    OT Start Time  0845    OT Stop Time  0930    OT Time Calculation (min)  45 min    Activity Tolerance  Patient limited by pain    Behavior During Therapy  [facility] for tasks assessed/performed       No past medical history on file.  Past Surgical History:  Procedure Laterality Date  . CESAREAN SECTION     fetal distress    There were no vitals filed for this visit.  Subjective Assessment - 07/28/18 0918    Subjective   I wore my night time splint more last night.     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 PMH: Bipolar, cocaine abuse    Limitations  NWB LUE, Fall risk, pt/family to get clarifications on any other restrictions/precautions LUE    Currently in Pain?  Yes    Pain Score  6     Pain Location  Foot    Pain Orientation  Left    Pain Descriptors / Indicators  Burning    Pain Type  Acute pain    Pain Onset  More than a month ago    Pain Frequency  Constant       TREATMENT:  Continued working on active PIP extension w/ MP's blocked in flexion, and isolated MP flexion with IP's blocked in extension w/ therapist assist x 15 reps each. P/ROM, and place and hold ex's for full  composite flexion of hand. Wrist flexion and extension x 15 reps each. P/ROM in thumb flexion, opposition, and palmer abduction, followed by AA/ROM in thumb palmer abduction as able with assist.                       OT Short Term Goals - 07/28/18 1300      OT SHORT TERM GOAL #1   Title  Independent with HEP to increase ROM Lt hand/wrist - 07/28/18    Baseline  dependent    Time  4    Period  Weeks    Status  Achieved      OT SHORT TERM GOAL #2   Title  Pt to demo full composite flexion Lt hand for grasping     Baseline  90%    Time  4    Period  Weeks    Status  On-going      OT SHORT TERM GOAL #3   Title  Pt to demo approx. 50% finger extension at PIP joints Lt hand in prep for grasping and for releasing objects    Baseline  none (only at MP joints)  Time  4    Period  Weeks    Status  On-going      OT SHORT TERM GOAL #4   Title  Pt to demo 90% elbow extension LUE for reaching activities    Baseline  75%    Time  4    Period  Weeks    Status  Achieved      OT SHORT TERM GOAL #5   Title  Pt to don/doff socks using one handed technique and lace shoes w/ A/E at mod I level    Baseline  dependent    Time  4    Period  Weeks    Status  Achieved   in clinic w/ A/E       OT Long Term Goals - 06/27/18 1410      OT LONG TERM GOAL #1   Title  Pt to be independent with updated HEP - 08/27/18    Baseline  Dependent at this time    Time  8    Period  Weeks    Status  New      OT LONG TERM GOAL #2   Title  Pt to demo 90% active finger extension Lt hand for releasing objects consistently     Baseline  no active PIP extension    Time  8    Period  Weeks    Status  New      OT LONG TERM GOAL #3   Title  Pt to demo 20 lbs grip strength Lt hand to sufficiently hold and lift weighted objects    Baseline  no grip strength    Time  8    Period  Weeks    Status  New      OT LONG TERM GOAL #4   Title  Pt to demo Lt wrist extension to 30* or greater for  functional tasks     Baseline  wrist ext only to neutral    Time  8    Period  Weeks    Status  New      OT LONG TERM GOAL #5   Title  Pt to return to simple cooking tasks and light cleaning tasks at mod I level    Baseline  dependent    Time  8    Period  Weeks    Status  New            Plan - 07/28/18 1300    Clinical Impression Statement  Pt continues to demo decreased active Lt hand motion d/t nerve damage and unable to achieve PIP extension w/o MP's blocked in flexion. Pt also w/ only trace thumb movement    Occupational Profile and client history currently impacting functional performance  PMH: Bipolar, cocaine abuse    Occupational performance deficits (Please refer to evaluation for details):  ADL's;IADL's;Social Participation    Rehab Potential  Fair    OT Frequency  2x / week    OT Duration  8 weeks    OT Treatment/Interventions  Self-care/ADL training;Moist Heat;Fluidtherapy;DME and/or AE instruction;Splinting;Compression bandaging;Therapeutic activities;Ultrasound;Therapeutic exercise;Scar mobilization;Coping strategies training;Functional Mobility Training;Passive range of motion;Electrical Stimulation;Paraffin;Manual Therapy;Patient/family education    Plan  dorsal splint w/ wrist neutral, MP's flexed, IP's free, and thumb in palmer abd to help w/ positioning and potential functional active use of hand for daytime, also ? estim if ok over skin graft OR for thumb palmer abduction    Consulted and Agree with Plan of Care  Patient  Patient will benefit from skilled therapeutic intervention in order to improve the following deficits and impairments:  Decreased coordination, Decreased range of motion, Increased edema, Impaired sensation, Increased fascial restrictions, Decreased skin integrity, Decreased knowledge of precautions, Impaired UE functional use, Pain, Decreased balance, Decreased mobility, Decreased strength, Impaired perceived functional ability  Visit  Diagnosis: Muscle weakness (generalized)  Stiffness of left hand, not elsewhere classified  Stiffness of left wrist, not elsewhere classified    Problem List Patient Active Problem List   Diagnosis Date Noted  . Smoker 05/06/2016  . Bipolar disorder ([place]) 05/06/2016    Carey Bullocks, OTR/L 07/28/2018, 1:05 PM  [facility] [address] [place], [place], [address] Phone: [phone]   Fax:  [phone]  Name: [name] MRN: [id number] Date of Birth: 24-May-1971

## 2018-08-01 ENCOUNTER — Ambulatory Visit: Payer: Medicaid Other | Admitting: Occupational Therapy

## 2018-08-01 DIAGNOSIS — M6281 Muscle weakness (generalized): Secondary | ICD-10-CM | POA: Diagnosis not present

## 2018-08-01 DIAGNOSIS — M79602 Pain in left arm: Secondary | ICD-10-CM

## 2018-08-01 DIAGNOSIS — M25632 Stiffness of left wrist, not elsewhere classified: Secondary | ICD-10-CM

## 2018-08-01 DIAGNOSIS — M25642 Stiffness of left hand, not elsewhere classified: Secondary | ICD-10-CM

## 2018-08-01 NOTE — Therapy (Signed)
[facility] [address] [place], [place], [address] Phone: [phone]   Fax:  [phone]  Occupational Therapy Treatment  Patient Details  Name: [name] MRN: [id number] Date of Birth: 25-Nov-1970 Referring Provider: Jasmine Pang   Encounter Date: 08/01/2018  OT End of Session - 08/01/18 1023    Visit Number  7    Number of Visits  17    Date for OT Re-Evaluation  08/27/18    Authorization Type  MCD     Authorization Time Period  Approved 12 visits from 07/05/18 - 08/15/18    Authorization - Visit Number  6    Authorization - Number of Visits  12    OT Start Time  0845    OT Stop Time  0930    OT Time Calculation (min)  45 min    Activity Tolerance  Patient limited by pain    Behavior During Therapy  [facility] for tasks assessed/performed       No past medical history on file.  Past Surgical History:  Procedure Laterality Date  . CESAREAN SECTION     fetal distress    There were no vitals filed for this visit.  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 PMH: Bipolar, cocaine abuse    Limitations  NWB LUE, Fall risk, pt/family to get clarifications on any other restrictions/precautions LUE    Currently in Pain?  Yes    Pain Score  3     Pain Location  --   thumb   Pain Orientation  Left    Pain Descriptors / Indicators  Aching    Pain Type  Acute pain    Pain Onset  More than a month ago    Pain Frequency  Intermittent    Aggravating Factors   only w/ passive movement Lt thumb    Pain Relieving Factors  rest                   OT Treatments/Exercises (OP) - 08/01/18 0001      ADLs   ADL Comments  Therapist  had called MD office (Dr. Inocente Salles) last week and asked about estim over skin graft, however medical assistant Windell Moment called back and reported that they had not seen her recently as pt had cancelled last 2 appointments, and MD did not feel comfortable giving the ok on estim over skin graft until he has seen her. Emphasized importance of pt rescheduling appt with Dr. Inocente Salles ASAP now that transportation is lined up. Pt agreed to do so.       Modalities   Modalities  Moist Heat      Moist Heat Therapy   Number Minutes Moist Heat  10 Minutes   while therapist simultaneously was working on splint pattern   Moist Heat Location  Hand;Wrist      Splinting   Splinting  Fabricated and fitted dorsal splint to block MP's in approx 60-70* flexion, wrist neutral to extension as able, and thumb palmer abduction for daytime use. However unable to finish strapping today - will issue next session      Manual Therapy   Manual Therapy  Passive ROM  Passive ROM  Following heat, therapist stretched hand in wrist extension, PIP extension, and thumb palmer abduction while waiting for splint to heat up                OT Short Term Goals - 07/28/18 1300      OT SHORT TERM GOAL #1   Title  Independent with HEP to increase ROM Lt hand/wrist - 07/28/18    Baseline  dependent    Time  4    Period  Weeks    Status  Achieved      OT SHORT TERM GOAL #2   Title  Pt to demo full composite flexion Lt hand for grasping     Baseline  90%    Time  4    Period  Weeks    Status  On-going      OT SHORT TERM GOAL #3   Title  Pt to demo approx. 50% finger extension at PIP joints Lt hand in prep for grasping and for releasing objects    Baseline  none (only at MP joints)     Time  4    Period  Weeks    Status  On-going      OT SHORT TERM GOAL #4   Title  Pt to demo 90% elbow extension LUE for reaching activities    Baseline  75%    Time  4    Period  Weeks    Status  Achieved      OT SHORT TERM GOAL  #5   Title  Pt to don/doff socks using one handed technique and lace shoes w/ A/E at mod I level    Baseline  dependent    Time  4    Period  Weeks    Status  Achieved   in clinic w/ A/E       OT Long Term Goals - 06/27/18 1410      OT LONG TERM GOAL #1   Title  Pt to be independent with updated HEP - 08/27/18    Baseline  Dependent at this time    Time  8    Period  Weeks    Status  New      OT LONG TERM GOAL #2   Title  Pt to demo 90% active finger extension Lt hand for releasing objects consistently     Baseline  no active PIP extension    Time  8    Period  Weeks    Status  New      OT LONG TERM GOAL #3   Title  Pt to demo 20 lbs grip strength Lt hand to sufficiently hold and lift weighted objects    Baseline  no grip strength    Time  8    Period  Weeks    Status  New      OT LONG TERM GOAL #4   Title  Pt to demo Lt wrist extension to 30* or greater for functional tasks     Baseline  wrist ext only to neutral    Time  8    Period  Weeks    Status  New      OT LONG TERM GOAL #5   Title  Pt to return to simple cooking tasks and light cleaning tasks at mod I level    Baseline  dependent    Time  8    Period  Weeks    Status  New  Plan - 08/01/18 1023    Clinical Impression Statement  Pt very stiff today and has not appeared to do ex's over weekend. Pt w/ severly limited Lt hand function    Occupational Profile and client history currently impacting functional performance  PMH: Bipolar, cocaine abuse    Occupational performance deficits (Please refer to evaluation for details):  ADL's;IADL's;Social Participation    Rehab Potential  Fair    OT Frequency  2x / week    OT Duration  8 weeks    OT Treatment/Interventions  Self-care/ADL training;Moist Heat;Fluidtherapy;DME and/or AE instruction;Splinting;Compression bandaging;Therapeutic activities;Ultrasound;Therapeutic exercise;Scar mobilization;Coping strategies training;Functional Mobility  Training;Passive range of motion;Electrical Stimulation;Paraffin;Manual Therapy;Patient/family education    Plan  finish strapping on dorsal splint, review wear/care and issue, continue A/ROM and P/ROM Lt hand as able    Consulted and Agree with Plan of Care  Patient       Patient will benefit from skilled therapeutic intervention in order to improve the following deficits and impairments:  Decreased coordination, Decreased range of motion, Increased edema, Impaired sensation, Increased fascial restrictions, Decreased skin integrity, Decreased knowledge of precautions, Impaired UE functional use, Pain, Decreased balance, Decreased mobility, Decreased strength, Impaired perceived functional ability  Visit Diagnosis: Stiffness of left hand, not elsewhere classified  Stiffness of left wrist, not elsewhere classified  Muscle weakness (generalized)  Pain in left arm    Problem List Patient Active Problem List   Diagnosis Date Noted  . Smoker 05/06/2016  . Bipolar disorder ([place]) 05/06/2016    Carey Bullocks, OTR/L 08/01/2018, 10:25 AM  [facility] [address] [place], [place], [address] Phone: [phone]   Fax:  [phone]  Name: VINETA CARONE MRN: [id number] Date of Birth: [birth year]

## 2018-08-03 ENCOUNTER — Ambulatory Visit: Payer: Medicaid Other | Admitting: Occupational Therapy

## 2018-08-03 DIAGNOSIS — M25632 Stiffness of left wrist, not elsewhere classified: Secondary | ICD-10-CM

## 2018-08-03 DIAGNOSIS — M6281 Muscle weakness (generalized): Secondary | ICD-10-CM | POA: Diagnosis not present

## 2018-08-03 DIAGNOSIS — M25642 Stiffness of left hand, not elsewhere classified: Secondary | ICD-10-CM

## 2018-08-03 NOTE — Therapy (Signed)
[facility] [address] [place], [place], [address] Phone: [phone]   Fax:  [phone]  Occupational Therapy Treatment  Patient Details  Name: [name] MRN: [id number] Date of Birth: Jun 27, 1971 Referring Provider: Jasmine Pang   Encounter Date: 08/03/2018  OT End of Session - 08/03/18 1047    Visit Number  8    Number of Visits  17    Date for OT Re-Evaluation  08/27/18    Authorization Type  MCD     Authorization Time Period  Approved 12 visits from 07/05/18 - 08/15/18    Authorization - Visit Number  7    Authorization - Number of Visits  12    OT Start Time  0845    OT Stop Time  0935    OT Time Calculation (min)  50 min    Activity Tolerance  Patient limited by pain    Behavior During Therapy  [facility] for tasks assessed/performed       No past medical history on file.  Past Surgical History:  Procedure Laterality Date  . CESAREAN SECTION     fetal distress    There were no vitals filed for this visit.  Subjective Assessment - 08/03/18 1042    Subjective   I made an appointment with Dr. Inocente Salles for next week - I think it's Sept 18th. 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 PMH: Bipolar, cocaine abuse    Limitations  NWB LUE, Fall risk, pt/family to get clarifications on any other restrictions/precautions LUE    Currently in Pain?  Yes   LE - O.T. not addressing                  OT Treatments/Exercises (OP) - 08/03/18 0001      Splinting   Splinting  Pt arrived w/ resting hand splint on, however pt was sliding out of it, so straps were not keeping fingers as straight as they could be. Reviewed proper donning/doffing and had pt practice.  Also provided new finger straps w/ cushion to go over fingers for better fit/comfort.  Finished dorsal daytime splint, however pt could not actively extend PIP's in splint, therefore adjusted splint to increase MP flexion, and pt was then able to achieve greater active PIP extension while in splint. Issued splint, and reviewed proper wear and care including donning correctly and monitoring skin closely for pressure sores (d/t dorsal splint). Pt instructed that it was important to still remove splint several times a day and do exercises including wrist extension, MP flexion w/ ex splint on, and reverse blocking ex's for PIP extension.                OT Short Term Goals - 07/28/18 1300      OT SHORT TERM GOAL #1   Title  Independent with HEP to increase ROM Lt hand/wrist - 07/28/18    Baseline  dependent    Time  4    Period  Weeks    Status  Achieved      OT SHORT TERM GOAL #2   Title  Pt to demo full composite flexion Lt hand for grasping     Baseline  90%    Time  4    Period  Weeks    Status  On-going      OT SHORT TERM GOAL #3   Title  Pt to demo approx. 50% finger extension at PIP joints Lt hand in prep for grasping and for releasing objects    Baseline  none (only at MP joints)     Time  4    Period  Weeks    Status  On-going      OT SHORT TERM GOAL #4   Title  Pt to demo 90% elbow extension LUE for reaching activities    Baseline  75%    Time  4    Period  Weeks    Status  Achieved      OT SHORT TERM GOAL #5   Title  Pt to don/doff socks using one handed technique and lace shoes w/ A/E at mod I level    Baseline  dependent    Time  4    Period  Weeks    Status  Achieved   in clinic w/ A/E       OT Long Term Goals - 06/27/18 1410      OT LONG TERM GOAL #1   Title  Pt to be independent with updated HEP - 08/27/18    Baseline  Dependent at this time    Time  8    Period  Weeks    Status  New      OT LONG TERM GOAL #2   Title  Pt to demo 90% active  finger extension Lt hand for releasing objects consistently     Baseline  no active PIP extension    Time  8    Period  Weeks    Status  New      OT LONG TERM GOAL #3   Title  Pt to demo 20 lbs grip strength Lt hand to sufficiently hold and lift weighted objects    Baseline  no grip strength    Time  8    Period  Weeks    Status  New      OT LONG TERM GOAL #4   Title  Pt to demo Lt wrist extension to 30* or greater for functional tasks     Baseline  wrist ext only to neutral    Time  8    Period  Weeks    Status  New      OT LONG TERM GOAL #5   Title  Pt to return to simple cooking tasks and light cleaning tasks at mod I level    Baseline  dependent    Time  8    Period  Weeks    Status  New            Plan - 08/03/18 1048    Clinical Impression Statement  Pt very stiff today and did not have resting hand splint on correctly. Pt admits she is not doing exercises like she should be    Occupational Profile and client history currently impacting functional performance  PMH: Bipolar, cocaine abuse    Occupational performance deficits (Please refer to evaluation for details):  ADL's;IADL's;Social Participation    Rehab Potential  Fair    Current Impairments/barriers affecting progress:  severity of deficits, recent skin graft, ? carryover of recommendations, pain    OT Frequency  2x / week    OT Duration  8 weeks    OT Treatment/Interventions  Self-care/ADL training;Moist Heat;Fluidtherapy;DME and/or  AE instruction;Splinting;Compression bandaging;Therapeutic activities;Ultrasound;Therapeutic exercise;Scar mobilization;Coping strategies training;Functional Mobility Training;Passive range of motion;Electrical Stimulation;Paraffin;Manual Therapy;Patient/family education    Plan  assess dorsal daytime splint and skin, **WRITE note to MD re: estim and pulsed [place] over skin graft, splint assessment, and anything else we should be doing or precautions. At end of next week, will need to  resubmit to MCD for 3 unused visits plus extra visits (5) P.T. will not use. Will place pt on hold after next week, then most likely reduce to 1x/wk    Consulted and Agree with Plan of Care  Patient       Patient will benefit from skilled therapeutic intervention in order to improve the following deficits and impairments:  Decreased coordination, Decreased range of motion, Increased edema, Impaired sensation, Increased fascial restrictions, Decreased skin integrity, Decreased knowledge of precautions, Impaired UE functional use, Pain, Decreased balance, Decreased mobility, Decreased strength, Impaired perceived functional ability  Visit Diagnosis: Stiffness of left hand, not elsewhere classified  Stiffness of left wrist, not elsewhere classified    Problem List Patient Active Problem List   Diagnosis Date Noted  . Smoker 05/06/2016  . Bipolar disorder ([place]) 05/06/2016    Carey Bullocks, OTR/L 08/03/2018, 10:50 AM  [facility] [address] [place], [place], [address] Phone: [phone]   Fax:  [phone]  Name: [name] MRN: [id number] Date of Birth: 11-06-1971

## 2018-08-09 ENCOUNTER — Ambulatory Visit: Payer: Medicaid Other | Admitting: Occupational Therapy

## 2018-08-11 ENCOUNTER — Ambulatory Visit: Payer: Medicaid Other | Admitting: Occupational Therapy

## 2018-08-11 DIAGNOSIS — M25632 Stiffness of left wrist, not elsewhere classified: Secondary | ICD-10-CM

## 2018-08-11 DIAGNOSIS — M79602 Pain in left arm: Secondary | ICD-10-CM

## 2018-08-11 DIAGNOSIS — R2681 Unsteadiness on feet: Secondary | ICD-10-CM

## 2018-08-11 DIAGNOSIS — R278 Other lack of coordination: Secondary | ICD-10-CM

## 2018-08-11 DIAGNOSIS — M6281 Muscle weakness (generalized): Secondary | ICD-10-CM | POA: Diagnosis not present

## 2018-08-11 DIAGNOSIS — M25642 Stiffness of left hand, not elsewhere classified: Secondary | ICD-10-CM

## 2018-08-11 DIAGNOSIS — R29818 Other symptoms and signs involving the nervous system: Secondary | ICD-10-CM

## 2018-08-11 NOTE — Therapy (Signed)
[facility] [address] [place], [place], [address] Phone: [phone]   Fax:  [phone]  Occupational Therapy Treatment  Patient Details  Name: [name] MRN: [id number] Date of Birth: May 29, 1971 Referring Provider: Jasmine Pang   Encounter Date: 08/11/2018  OT End of Session - 08/11/18 1305    Visit Number  9    Number of Visits  17    Date for OT Re-Evaluation  10/21/18    Authorization Type  MCD     Authorization Time Period  Approved 12 visits from 07/05/18 - 08/15/18    Authorization - Visit Number  8    Authorization - Number of Visits  12    OT Start Time  1100    OT Stop Time  1145    OT Time Calculation (min)  45 min    Activity Tolerance  Patient limited by pain    Behavior During Therapy  [facility] for tasks assessed/performed       No past medical history on file.  Past Surgical History:  Procedure Laterality Date  . CESAREAN SECTION     fetal distress    There were no vitals filed for this visit.  Subjective Assessment - 08/11/18 1300    Subjective   I'm starting to get sensation in my Lt hand. I saw Dr. 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 PMH: Bipolar, cocaine abuse    Limitations  NWB LUE, Fall risk, pt/family to get clarifications on any other restrictions/precautions LUE    Currently in Pain?  Yes    Pain Location  Hand    Pain Orientation  Left    Pain Descriptors / Indicators  Aching;Pins and needles    Pain Type  Neuropathic pain;Acute pain    Pain Onset  In the past 7 days    Pain Frequency  Intermittent    Aggravating Factors   P/ROM    Pain Relieving Factors  Rest                   OT Treatments/Exercises (OP) - 08/11/18 0001      ADLs   ADL Comments  CMA from Dr. Freddrick March office called during O.T. session and therapist asked if she could receive estim and pulsed [place] over skin graft. CMA reports she will send to MD and call back.       Splinting   Splinting  Assessed both splints. Pt has max difficulty getting resting hand splint on by herself d/t extreme flexion of IP joints. Pt unable to tolerate resting hand splint all night, therefore instructed to wear during intervals at day. Dorsal splint will eventually need to be adjusted for more wrist extension and thumb palmer abduction, however do not think pt can tolerate at this time.       Continued P/ROM, place and hold ex's and active ex's as able Lt hand w/ focus on active wrist extension, blocking MP's in flexion for PIP flex and active extension, and thumb place and hold ex's.          OT Short Term Goals - 08/11/18 1306      OT SHORT TERM GOAL #1   Title  Independent with HEP to increase ROM Lt hand/wrist - 07/28/18  Baseline  dependent    Time  4    Period  Weeks    Status  Achieved      OT SHORT TERM GOAL #2   Title  Pt to demo full composite flexion Lt hand for grasping     Baseline  80%    Time  4    Period  Weeks    Status  On-going      OT SHORT TERM GOAL #3   Title  Pt to demo approx. 50% finger extension at PIP joints Lt hand in prep for grasping and for releasing objects    Baseline  none (only at MP joints)     Time  4    Period  Weeks    Status  On-going   able to achieve 50% PIP extension only when MP's blocked in flexion     OT SHORT TERM GOAL #4   Title  Pt to demo 90% elbow extension LUE for reaching activities    Baseline  75%    Time  4    Period  Weeks    Status  Achieved      OT SHORT TERM GOAL #5   Title  Pt to don/doff socks using one handed technique and lace shoes w/ A/E at mod I level    Baseline  dependent    Time  4    Period  Weeks    Status  Achieved   in clinic w/ A/E       OT Long Term Goals -  08/11/18 1307      OT LONG TERM GOAL #1   Title  Pt to be independent with updated HEP - 10/21/18    Baseline  Dependent at this time    Time  8    Period  Weeks    Status  New      OT LONG TERM GOAL #2   Title  Pt to demo 90% active finger extension Lt hand for releasing objects consistently     Baseline  no active PIP extension    Time  8    Period  Weeks    Status  On-going   only at MP's, not PIP's d/t extensive nerve damage     OT LONG TERM GOAL #3   Title  Pt to demo 20 lbs grip strength Lt hand to sufficiently hold and lift weighted objects    Baseline  no grip strength    Time  8    Period  Weeks    Status  New      OT LONG TERM GOAL #4   Title  Pt to demo Lt wrist extension to 30* or greater for functional tasks     Baseline  wrist ext only to neutral    Time  8    Period  Weeks    Status  On-going   08/11/18: 15* extension     OT LONG TERM GOAL #5   Title  Pt to return to simple cooking tasks and light cleaning tasks at mod I level    Baseline  dependent    Time  8    Period  Weeks    Status  New            Plan - 08/11/18 1309    Clinical Impression Statement  Pt has met several STG's (see goal section for update). However progress re: Lt hand function will be slow due to extensive nerve damage. O.T.  continues to be beneficial to maintain ROM in Lt hand as able, education re: exercises, and task modifications/AE retraining for one handed use since Lt hand is currently not at functional level.     Occupational Profile and client history currently impacting functional performance  PMH: Bipolar, cocaine abuse    Occupational performance deficits (Please refer to evaluation for details):  ADL's;IADL's;Social Participation    Rehab Potential  Fair    Current Impairments/barriers affecting progress:  severity of deficits, recent skin graft, pain    OT Frequency  1x / week    OT Duration  8 weeks   (8 additional weeks requested)   OT Treatment/Interventions   Self-care/ADL training;Moist Heat;Fluidtherapy;DME and/or AE instruction;Splinting;Compression bandaging;Therapeutic activities;Ultrasound;Therapeutic exercise;Scar mobilization;Coping strategies training;Functional Mobility Training;Passive range of motion;Electrical Stimulation;Paraffin;Manual Therapy;Patient/family education    Plan  check on MCD approval for 8 more visits, begin estim for finger flexion (over skin graft) if cleared by MD, Continue P/ROM, place and hold ex's Lt hand as able    Consulted and Agree with Plan of Care  Patient       Patient will benefit from skilled therapeutic intervention in order to improve the following deficits and impairments:  Decreased coordination, Decreased range of motion, Increased edema, Impaired sensation, Increased fascial restrictions, Decreased skin integrity, Decreased knowledge of precautions, Impaired UE functional use, Pain, Decreased balance, Decreased mobility, Decreased strength, Impaired perceived functional ability  Visit Diagnosis: Stiffness of left hand, not elsewhere classified  Stiffness of left wrist, not elsewhere classified  Muscle weakness (generalized)  Pain in left arm  Other symptoms and signs involving the nervous system  Unsteadiness on feet  Other lack of coordination    Problem List Patient Active Problem List   Diagnosis Date Noted  . Smoker 05/06/2016  . Bipolar disorder ([place]) 05/06/2016    Carey Bullocks, OTR/L 08/11/2018, 1:16 PM  [facility] [address] [place], [place], [address] Phone: [phone]   Fax:  [phone]  Name: TISHINA LOWN MRN: [id number] Date of Birth: 08-04-1971

## 2018-08-12 ENCOUNTER — Encounter: Payer: Self-pay | Admitting: Rehabilitation

## 2018-08-12 ENCOUNTER — Ambulatory Visit: Payer: Medicaid Other | Admitting: Rehabilitation

## 2018-08-12 DIAGNOSIS — M6281 Muscle weakness (generalized): Secondary | ICD-10-CM

## 2018-08-12 DIAGNOSIS — R2689 Other abnormalities of gait and mobility: Secondary | ICD-10-CM

## 2018-08-12 DIAGNOSIS — R2681 Unsteadiness on feet: Secondary | ICD-10-CM

## 2018-08-12 NOTE — Therapy (Signed)
[facility] [address] [place], [place], [address] Phone: [phone]   Fax:  [phone]  Physical Therapy Treatment  Patient Details  Name: [name] MRN: [id number] Date of Birth: 1971/06/30 Referring Provider: Jasmine Pang   Encounter Date: 08/12/2018  PT End of Session - 08/12/18 1123    Visit Number  5    Number of Visits  8   eval plus 7 visits   Date for PT Re-Evaluation  07/27/18   end of 1st auth period   Authorization Type  Medicaid;     Authorization Time Period  9/20-10/17-4 more visits approved     Authorization - Visit Number  1    Authorization - Number of Visits  4    PT Start Time  0845    PT Stop Time  0930    PT Time Calculation (min)  45 min    Activity Tolerance  Patient limited by pain    Behavior During Therapy  Anxious       History reviewed. No pertinent past medical history.  Past Surgical History:  Procedure Laterality Date  . CESAREAN SECTION     fetal distress    There were no vitals filed for this visit.  Subjective Assessment - 08/12/18 0855    Subjective  Pt reports increased pain today in R foot, but does have her AFO with her. She reports that it was rubbing the anterior portion of her leg, but better over pants.     Pertinent History  Bipolar disorder; polysubstance abuse     Diagnostic tests  MRI Lumbar-Anterolisthesis of L5 on S1 resulting in moderately severe foraminal stenosis on the left and moderate stenosis on the right, which results in mass effect on the left greater than right exiting L5 nerve root. Moderate spinal canal stenosis.    Patient Stated Goals  I'd like to be able to walk without a device.    Currently in Pain?  Yes    Pain Score  7     Pain Location  Foot    Pain Orientation  Left    Pain Descriptors / Indicators  Aching;Burning;Tingling;Stabbing    Pain Type  Neuropathic pain    Pain Onset  More than a month ago    Pain Frequency   Constant    Aggravating Factors   nothing    Pain Relieving Factors  gabapentin                        [facility] Adult PT Treatment/Exercise - 08/12/18 0850      Transfers   Transfers  Sit to Stand;Stand to Sit    Sit to Stand  5: Supervision    Sit to Stand Details  Verbal cues for sequencing;Verbal cues for technique    Sit to Stand Details (indicate cue type and reason)  Addressed sit<>stand during session for improved LLE weight bearing and midline posture.  Pt able to do with cuing and continued practice.  Performed x 10 reps and added to HEP    Stand to Sit  5: Supervision    Stand to Sit Details (indicate cue type and reason)  Verbal cues for sequencing;Verbal cues for technique    Stand to Sit Details  Cues for controlled descent       Ambulation/Gait   Ambulation/Gait  Yes    Ambulation/Gait Assistance  4: Min guard    Ambulation/Gait Assistance Details  Pt ambulatory into  clinic not wearing AFO due to pain she had noted at shin area.  She ambulates with SBQC at min/guard level with cues for upright posture and improved L lateral weight shift.  Once seated, assisted with donning L PLS AFO (pt did not wear socks due to pain with any compression).  Then ambulated another 115' at min/guard level, again with cues for upright posture, forward gaze, and improved weight shift.  Also note that quad cane is a little too short for her height, therefore elevated to more appropriate height.  Ambulated another 36' during session with pt reporting that this "feels better" and note more midline posture and less R lateral trunk lean.      Ambulation Distance (Feet)  115 Feet   x 3   Assistive device  Small based quad cane;Straight cane    Gait Pattern  Step-through pattern;Decreased step length - right;Decreased step length - left;Decreased dorsiflexion - left;Left steppage;Left foot flat;Poor foot clearance - left    Ambulation Surface  Level;Indoor    Stairs  Yes    Stairs  Assistance  5: Supervision    Stairs Assistance Details (indicate cue type and reason)  Pt only willing to ascend with R LE and descend with LLE and single rail.     Stair Management Technique  One rail Right;Step to pattern;Forwards    Number of Stairs  4    Height of Stairs  6      Self-Care   Self-Care  Other Self-Care Comments    Other Self-Care Comments   Provided education on seeking MD recommendations regarding medication as she still mentioned during session being concerned with over medicating herself.  Also she mentioned wound on LLE (right below strap of AFO) and that it is now "leaking fluid".  Highly recommend she contact MD about getting OP wound care since home health wound care was denied.  Also provided education on wear time for AFO.  Educated to begin with 1-2 hours then remove, inspect skin and keep off for approx 1 hour and then don again for another 1-2 hours.  Educated to increase time on every 3-5 days as long as skin is okay.  Provided max education to ensure that strap is not rubbing on wound creating more friction and skin breakdown.  Pt verbalized understanding.        Neuro Re-ed    Neuro Re-ed Details   Reviewed HEP for LLE NMR strengthening and flexibility.  See pt instruction for details.          Access Code: [email or web address]  URL: [email or web address]  Date: 08/12/2018  Prepared by: Cameron Sprang   Exercises  Standing Ankle Dorsiflexion Stretch - 3 reps - 1 sets - 30 seconds hold - 3x daily - 7x weekly  Wall Slide with Posterior Pelvic Tilt - 10 reps - 1 sets - 3-5 seconds hold - 2x daily - 5x weekly  Standing Hip Abduction with Counter Support - 20 reps - 1 sets - 3 seconds hold - 2x daily - 5x weekly  Standing Knee Flexion - 10 reps - 3 sets - 2x daily - 7x weekly  Sit to Stand without Arm Support - 10 reps - 1 sets - 2x daily - 7x weekly      PT Education - 08/12/18 1123    Education Details  see self care     Person(s) Educated  Patient     Methods  Explanation    Comprehension  Verbalized understanding  PT Short Term Goals - 07/27/18 1207      PT SHORT TERM GOAL #1   Title  Patient will be independent with basic HEP for LLE strengthening, stretching, and balance/gait.  (Target for all STGs TBD by Medicaid--by 3rd treatment visit)    Baseline  No HEP from inpatient rehab; 07/27/18 requires vc for technique with standing exercises; supine/sitting independent    Time  3    Period  Weeks    Status  Partially Met      PT SHORT TERM GOAL #2   Title  Patient will ambulate with LRAD and AFO (vs clinic AFO) with velocity >=1.81 ft/sec to demonstrate lesser fall risk.     Baseline  06/27/18 with SBQC and no AFO 1.23 ft/sec; 07/27/18 SBQC and clinic AFO (pt's has not yet been approved by insurance) 1.46 ft/sec    Time  3    Period  Weeks    Status  Partially Met      PT SHORT TERM GOAL #3   Title  Patient will demonstrate incr strength by reducing 5 times sit to stand to <10 seconds.    Baseline  06/27/18 15.5 seconds (>12 sec indicates higher fall risk); 07/27/18 7.53 seconds    Time  3    Period  Weeks    Status  Achieved      PT SHORT TERM GOAL #4   Title  Patient will ambulate 200 ft over level indoor surface in <=2 minutes    Baseline  06/27/18 Required standing rest break after 50 feet due to pain. 07/27/18 200' with SBQC, clinic AFO in 2 min 11 sec with no rest breaks    Time  3    Period  Weeks    Status  Partially Met        PT Long Term Goals - 08/12/18 1126      PT LONG TERM GOAL #1   Title  Patient will be independent with updated HEP (Target all LTGs by 7th visit--date 09/08/18).     Baseline  06/27/18 no HEP     Time  7    Period  Weeks    Status  New      PT LONG TERM GOAL #2   Title  Patient will ambulate with LRAD and AFO (vs clinic AFO) with velocity >=2.0 ft/sec to demonstrate lesser fall risk.     Baseline  06/27/18 with SBQC and no AFO 1.23 ft/sec    Time  7    Period  Weeks    Status  New      PT  LONG TERM GOAL #3   Title  Patient will ambulate 200 ft over level indoor surface in <=1 minute, 45 seconds    Baseline  06/27/18 Required standing rest break after 50 feet due to pain.     Time  7    Period  Weeks    Status  New      PT LONG TERM GOAL #4   Title  Patient will ascend/descend 2 steps no rail with LRAD vs no device modified independent    Baseline  8/5 uses SBQC and minguard    Time  7    Period  Weeks    Status  New            Plan - 08/12/18 1124    Clinical Impression Statement  Pt has received her L PLS AFO from [employer].  She initially reported pain at shin from strap,  however with pants this seemed to help.  Discussed that she may have to place smooth cloth as barrier between leg and strap for improved comfort.  Went through [employer] again as she reports non-compliance.  Encouraged her to be more active throughout the day to assist with decreasing pain.  Pt approved for 4 more visits through [employer].      Rehab Potential  Good    Clinical Impairments Affecting Rehab Potential  Patient's pain tolerance;     PT Frequency  1x / week    PT Duration  Other (comment)   7 weeks   PT Treatment/Interventions  ADLs/Self Care Home Management;Electrical Stimulation;Gait training;DME Instruction;Stair training;Functional mobility training;Therapeutic activities;Therapeutic exercise;Balance training;Neuromuscular re-education;Orthotic Fit/Training;Patient/family education;Scar mobilization;Passive range of motion    PT Next Visit Plan  she was approved for 4 more visits, however Lynn's plan was 7 so not sure if you want the last one, continue with gait training with her AFO and LRAD vs her SBQC. continue to work on LLE stretching and strengthening; encourage/monitor adherence to HEP    PT Home Exercise Plan  seated or standing left gastroc stretch; wall slides, standing knee flexion, hip abdct, single LE bridge, ankle and hamstring stretch with strap in supine;     Consulted and Agree with  Plan of Care  Patient       Patient will benefit from skilled therapeutic intervention in order to improve the following deficits and impairments:  Abnormal gait, Decreased activity tolerance, Decreased balance, Decreased mobility, Decreased knowledge of use of DME, Decreased range of motion, Decreased skin integrity, Decreased strength, Impaired flexibility, Impaired sensation, Impaired UE functional use, Pain  Visit Diagnosis: Muscle weakness (generalized)  Unsteadiness on feet  Other abnormalities of gait and mobility     Problem List Patient Active Problem List   Diagnosis Date Noted  . Smoker 05/06/2016  . Bipolar disorder ([place]) 05/06/2016    Cameron Sprang, PT, MPT [facility] [address] [place], [place], [address] Phone: [phone]   Fax:  [phone] 08/12/18, 11:27 AM  Name: [name] MRN: [id number] Date of Birth: Feb 21, 1971

## 2018-08-12 NOTE — Patient Instructions (Signed)
Access Code: [email or web address]  URL: [email or web address]  Date: 08/12/2018  Prepared by: Cameron Sprang   Exercises  Standing Ankle Dorsiflexion Stretch - 3 reps - 1 sets - 30 seconds hold - 3x daily - 7x weekly  Wall Slide with Posterior Pelvic Tilt - 10 reps - 1 sets - 3-5 seconds hold - 2x daily - 5x weekly  Standing Hip Abduction with Counter Support - 20 reps - 1 sets - 3 seconds hold - 2x daily - 5x weekly  Standing Knee Flexion - 10 reps - 3 sets - 2x daily - 7x weekly  Sit to Stand without Arm Support - 10 reps - 1 sets - 2x daily - 7x weekly

## 2018-08-18 ENCOUNTER — Ambulatory Visit: Payer: Medicaid Other | Admitting: Physical Therapy

## 2018-08-18 ENCOUNTER — Encounter: Payer: Self-pay | Admitting: Physical Therapy

## 2018-08-18 DIAGNOSIS — M6281 Muscle weakness (generalized): Secondary | ICD-10-CM | POA: Diagnosis not present

## 2018-08-18 DIAGNOSIS — R2689 Other abnormalities of gait and mobility: Secondary | ICD-10-CM

## 2018-08-18 NOTE — Therapy (Signed)
[facility] [address] [place], [place], [address] Phone: [phone]   Fax:  [phone]  Physical Therapy Treatment  Patient Details  Name: [name] MRN: [id number] Date of Birth: 02-Nov-1971 Referring Provider: Jasmine Pang   Encounter Date: 08/18/2018  PT End of Session - 08/18/18 1157    Visit Number  6    Number of Visits  8   eval plus 7 visits   Date for PT Re-Evaluation  09/08/18   end of 2nd auth period   Authorization Type  Medicaid;     Authorization Time Period  9/20-10/17-4 more visits approved     Authorization - Visit Number  2    Authorization - Number of Visits  4    PT Start Time  3614   pt arrived late   PT Stop Time  1147    PT Time Calculation (min)  34 min    Activity Tolerance  Patient limited by pain    Behavior During Therapy  Anxious       History reviewed. No pertinent past medical history.  Past Surgical History:  Procedure Laterality Date  . CESAREAN SECTION     fetal distress    There were no vitals filed for this visit.  Subjective Assessment - 08/18/18 1117    Subjective  Left brace in the car. Still having skin issues and incr pain along shin when wearing it. No falls. Reports she has not been working on wear tolerance due to incr pain when wearing it.    Pertinent History  Bipolar disorder; polysubstance abuse     Diagnostic tests  MRI Lumbar-Anterolisthesis of L5 on S1 resulting in moderately severe foraminal stenosis on the left and moderate stenosis on the right, which results in mass effect on the left greater than right exiting L5 nerve root. Moderate spinal canal stenosis.    Patient Stated Goals  I'd like to be able to walk without a device.    Currently in Pain?  Yes    Pain Score  6     Pain Location  Leg    Pain Orientation  Left    Pain Descriptors / Indicators  Burning;Tingling;Aching    Pain Type  Neuropathic pain    Pain Onset  More than a  month ago    Pain Frequency  Constant    Aggravating Factors   everything hurts    Pain Relieving Factors  gabapentin    Effect of Pain on Daily Activities  limits all activity       Treatment- Donned lt AFO and educated on need to tighten shoe laces as much as possible (OT had educated her on use of shoe button, however she has been unable to order one) to prevent sliding up/down in shoe. Educated on using knee high sock/stocking to protect her skin. Educated on monitoring red area under the strap on later calf and how to increase wearing time (if tolerated).   Gait training-with Lt AFO and SBQC on rt with cues for heelstrike with progression to rolling off over 1st toe (she tends to walk with LLE in external rotation). Despite improved gait dynamics with cues/education she could not progress to less RUE support on SBQC due to degree of neuropathic pain she continues to have in LLE.   There-ex-  Seated hamstring stretch with LLE elevated and allowing gravity to stretch. Seated left dorsiflexion/soleus stretch pulling foot underneath chair and keeping heel flat. Pt able to achieve  full PROM at knee and ankle. Performed PROM repetitions with attempt to progress to [facility] for plantarflexion and dorsiflexion and no palpable muscle activity noted.                           PT Education - 08/18/18 1153    Education Details  need to monitor skin tolerance with brace (removing every 1-2 hours to start; 1 hour break before don again); monitor red area where strap crosses and use band-aids to protect (area marked with permanent marker for her to monitor if area of redness is increasing or decreasing)    Person(s) Educated  Patient    Methods  Explanation    Comprehension  Verbalized understanding       PT Short Term Goals - 07/27/18 1207      PT SHORT TERM GOAL #1   Title  Patient will be independent with basic HEP for LLE strengthening, stretching, and balance/gait.  (Target  for all STGs TBD by Medicaid--by 3rd treatment visit)    Baseline  No HEP from inpatient rehab; 07/27/18 requires vc for technique with standing exercises; supine/sitting independent    Time  3    Period  Weeks    Status  Partially Met      PT SHORT TERM GOAL #2   Title  Patient will ambulate with LRAD and AFO (vs clinic AFO) with velocity >=1.81 ft/sec to demonstrate lesser fall risk.     Baseline  06/27/18 with SBQC and no AFO 1.23 ft/sec; 07/27/18 SBQC and clinic AFO (pt's has not yet been approved by insurance) 1.46 ft/sec    Time  3    Period  Weeks    Status  Partially Met      PT SHORT TERM GOAL #3   Title  Patient will demonstrate incr strength by reducing 5 times sit to stand to <10 seconds.    Baseline  06/27/18 15.5 seconds (>12 sec indicates higher fall risk); 07/27/18 7.53 seconds    Time  3    Period  Weeks    Status  Achieved      PT SHORT TERM GOAL #4   Title  Patient will ambulate 200 ft over level indoor surface in <=2 minutes    Baseline  06/27/18 Required standing rest break after 50 feet due to pain. 07/27/18 200' with SBQC, clinic AFO in 2 min 11 sec with no rest breaks    Time  3    Period  Weeks    Status  Partially Met        PT Long Term Goals - 08/12/18 1126      PT LONG TERM GOAL #1   Title  Patient will be independent with updated HEP (Target all LTGs by 7th visit--date 09/08/18).     Baseline  06/27/18 no HEP     Time  7    Period  Weeks    Status  New      PT LONG TERM GOAL #2   Title  Patient will ambulate with LRAD and AFO (vs clinic AFO) with velocity >=2.0 ft/sec to demonstrate lesser fall risk.     Baseline  06/27/18 with SBQC and no AFO 1.23 ft/sec    Time  7    Period  Weeks    Status  New      PT LONG TERM GOAL #3   Title  Patient will ambulate 200 ft over level indoor surface in <=  1 minute, 45 seconds    Baseline  06/27/18 Required standing rest break after 50 feet due to pain.     Time  7    Period  Weeks    Status  New      PT LONG TERM GOAL  #4   Title  Patient will ascend/descend 2 steps no rail with LRAD vs no device modified independent    Baseline  8/5 uses SBQC and minguard    Time  7    Period  Weeks    Status  New            Plan - 08/18/18 1203    Clinical Impression Statement  Pt arrived late and had left her AFO in the car. Noted area of redness lateral left calf and open, scabbed area left lateral malleolus. Got the brace from her car and donned (pt had no socks to don and discussed need for sock to protect skin). Area at malleolus does not contact the brace (there is a 1" gap between her skin and the brace and brace actually holds her shoe out so that it does not contact the malleolus wound). Reviewed wearing schedule and importance of monitoring skin (see also education). Educated on heel cord and hamstring stretch (continues to have full PROM). Gait training with cues to "keep foot forward facing and roll off her 1st toe." She is unable to attempt gait without SBQC when wearing brace due to pain and uses cane more for off-loading LLE than for balance. Discussed need to complete her HEP and may only require one more PT visit for further gait training.     Rehab Potential  Good    Clinical Impairments Affecting Rehab Potential  Patient's pain tolerance;     PT Frequency  1x / week    PT Duration  Other (comment)   7 weeks   PT Treatment/Interventions  ADLs/Self Care Home Management;Electrical Stimulation;Gait training;DME Instruction;Stair training;Functional mobility training;Therapeutic activities;Therapeutic exercise;Balance training;Neuromuscular re-education;Orthotic Fit/Training;Patient/family education;Scar mobilization;Passive range of motion    PT Next Visit Plan  continue with gait training with her AFO and LRAD vs her SBQC. continue to work on LLE stretching and strengthening; encourage/monitor adherence to HEP; ? make this last visit    PT Home Exercise Plan  seated or standing left gastroc stretch; wall  slides, standing knee flexion, hip abdct, single LE bridge, ankle and hamstring stretch with strap in supine;     Consulted and Agree with Plan of Care  Patient       Patient will benefit from skilled therapeutic intervention in order to improve the following deficits and impairments:  Abnormal gait, Decreased activity tolerance, Decreased balance, Decreased mobility, Decreased knowledge of use of DME, Decreased range of motion, Decreased skin integrity, Decreased strength, Impaired flexibility, Impaired sensation, Impaired UE functional use, Pain  Visit Diagnosis: Muscle weakness (generalized)  Other abnormalities of gait and mobility     Problem List Patient Active Problem List   Diagnosis Date Noted  . Smoker 05/06/2016  . Bipolar disorder ([place]) 05/06/2016    Rexanne Mano, PT 08/18/2018, 12:21 PM  [facility] [address] [place], [place], [address] Phone: [phone]   Fax:  [phone]  Name: [name] MRN: [id number] Date of Birth: 05-26-1971

## 2018-08-22 ENCOUNTER — Ambulatory Visit: Payer: Medicaid Other | Admitting: Physical Therapy

## 2018-08-22 ENCOUNTER — Ambulatory Visit: Payer: Medicaid Other | Admitting: Occupational Therapy

## 2018-08-22 ENCOUNTER — Telehealth: Payer: Self-pay

## 2018-08-22 ENCOUNTER — Encounter: Payer: Self-pay | Admitting: Physical Therapy

## 2018-08-22 DIAGNOSIS — R2689 Other abnormalities of gait and mobility: Secondary | ICD-10-CM

## 2018-08-22 DIAGNOSIS — R2681 Unsteadiness on feet: Secondary | ICD-10-CM

## 2018-08-22 DIAGNOSIS — M6281 Muscle weakness (generalized): Secondary | ICD-10-CM | POA: Diagnosis not present

## 2018-08-22 DIAGNOSIS — R29818 Other symptoms and signs involving the nervous system: Secondary | ICD-10-CM

## 2018-08-22 NOTE — Telephone Encounter (Signed)
Called and spoke with patient about missed appointments today. Pt was transferred to front office to reschedule this week if able.

## 2018-08-22 NOTE — Patient Instructions (Addendum)
Access Code: [email or web address]  URL: [email or web address]  Date: 08/22/2018  Prepared by: Barry Brunner   Exercises  Standing Ankle Dorsiflexion Stretch - 3 reps - 1 sets - 30 seconds hold - 3x daily - 7x weekly  Wall Slide with Posterior Pelvic Tilt - 10 reps - 1 sets - 3-5 seconds hold - 2x daily - 5x weekly  Standing Hip Abduction with Counter Support - 20 reps - 1 sets - 3 seconds hold - 2x daily - 5x weekly  Standing Knee Flexion - 10 reps - 3 sets - 2x daily - 7x weekly  Sit to Stand without Arm Support - 10 reps - 1 sets - 2x daily - 7x weekly   ADDED TODAY: Seated Heel Raise - 10 reps - 1 sets - 2x daily - 7x weekly  Seated Toe Raise - 10 reps - 1 sets - 2x daily - 7x weekly

## 2018-08-22 NOTE — Therapy (Signed)
[facility] [address] [place], [place], [address] Phone: [phone]   Fax:  [phone]  Physical Therapy Treatment  Patient Details  Name: [name] MRN: [id number] Date of Birth: [birth year] Referring Provider (PT): Jasmine Pang   Encounter Date: 08/22/2018  PT End of Session - 08/22/18 1958    Visit Number  7    Number of Visits  8   eval plus 7 visits   Date for PT Re-Evaluation  09/08/18   end of 2nd auth period   Authorization Type  Medicaid;     Authorization Time Period  9/20-10/17-4 more visits approved     Authorization - Visit Number  3    Authorization - Number of Visits  4    PT Start Time  7564   late arrival   PT Stop Time  1104    PT Time Calculation (min)  35 min    Activity Tolerance  Patient limited by pain    Behavior During Therapy  Anxious       History reviewed. No pertinent past medical history.  Past Surgical History:  Procedure Laterality Date  . CESAREAN SECTION     fetal distress    There were no vitals filed for this visit.  Subjective Assessment - 08/22/18 1027    Subjective  Has not been wearing brace because it hurts her foot too much to have it in her shoe. She has not been able to get knee high stocking or sock to wear under AFO to further protect her skin.     Pertinent History  Bipolar disorder; polysubstance abuse     Diagnostic tests  MRI Lumbar-Anterolisthesis of L5 on S1 resulting in moderately severe foraminal stenosis on the left and moderate stenosis on the right, which results in mass effect on the left greater than right exiting L5 nerve root. Moderate spinal canal stenosis.    Patient Stated Goals  I'd like to be able to walk without a device.    Currently in Pain?  Yes    Pain Score  6     Pain Location  Leg    Pain Orientation  Left    Pain Descriptors / Indicators  Burning;Aching    Pain Type  Neuropathic pain    Pain Onset  More than a  month ago    Pain Frequency  Constant    Aggravating Factors   everything hurts    Pain Relieving Factors  aspercreme rub        Treatment- Educated re: donning AFO, importance of wearing AFO when up walking (knee stability, foot clearance, reduce risk of falling). Reviewed need for knee high stocking/sock (cut piece of stockinette for her to use in the meantime).  Gait training-with AFO and SBQC pt able to control left heelstrike to roll-off over 1st toe for toe-off with minimal weight thru cane. Without SBQC pt with decr wt-shift over LLE, decr stance time on LLE and shortened RLE step length. With min assist able to facilitate full weight shift over LLE in stance and improve RLE step length. Total gait x 240 ft.  Stair training-instructed in sequencing with single rail, step through technique ascending and step-to pattern to descend (due to AFO LLE).   There-ex- removed LLE AFO and performed seated toe raise and heel raise. 1+ lt plantarflexors noted; 0 dorsiflexion  PT Education - 08/22/18 1957    Education Details  proper donning left AFO    Person(s) Educated  Patient    Methods  Explanation;Demonstration    Comprehension  Verbalized understanding;Returned demonstration       PT Short Term Goals - 07/27/18 1207      PT SHORT TERM GOAL #1   Title  Patient will be independent with basic HEP for LLE strengthening, stretching, and balance/gait.  (Target for all STGs TBD by Medicaid--by 3rd treatment visit)    Baseline  No HEP from inpatient rehab; 07/27/18 requires vc for technique with standing exercises; supine/sitting independent    Time  3    Period  Weeks    Status  Partially Met      PT SHORT TERM GOAL #2   Title  Patient will ambulate with LRAD and AFO (vs clinic AFO) with velocity >=1.81 ft/sec to demonstrate lesser fall risk.     Baseline  06/27/18 with SBQC and no AFO 1.23 ft/sec; 07/27/18 SBQC and clinic AFO (pt's has not yet been  approved by insurance) 1.46 ft/sec    Time  3    Period  Weeks    Status  Partially Met      PT SHORT TERM GOAL #3   Title  Patient will demonstrate incr strength by reducing 5 times sit to stand to <10 seconds.    Baseline  06/27/18 15.5 seconds (>12 sec indicates higher fall risk); 07/27/18 7.53 seconds    Time  3    Period  Weeks    Status  Achieved      PT SHORT TERM GOAL #4   Title  Patient will ambulate 200 ft over level indoor surface in <=2 minutes    Baseline  06/27/18 Required standing rest break after 50 feet due to pain. 07/27/18 200' with SBQC, clinic AFO in 2 min 11 sec with no rest breaks    Time  3    Period  Weeks    Status  Partially Met        PT Long Term Goals - 08/12/18 1126      PT LONG TERM GOAL #1   Title  Patient will be independent with updated HEP (Target all LTGs by 7th visit--date 09/08/18).     Baseline  06/27/18 no HEP     Time  7    Period  Weeks    Status  New      PT LONG TERM GOAL #2   Title  Patient will ambulate with LRAD and AFO (vs clinic AFO) with velocity >=2.0 ft/sec to demonstrate lesser fall risk.     Baseline  06/27/18 with SBQC and no AFO 1.23 ft/sec    Time  7    Period  Weeks    Status  New      PT LONG TERM GOAL #3   Title  Patient will ambulate 200 ft over level indoor surface in <=1 minute, 45 seconds    Baseline  06/27/18 Required standing rest break after 50 feet due to pain.     Time  7    Period  Weeks    Status  New      PT LONG TERM GOAL #4   Title  Patient will ascend/descend 2 steps no rail with LRAD vs no device modified independent    Baseline  8/5 uses SBQC and minguard    Time  7    Period  Weeks    Status  New            Plan - 08/22/18 1959    Clinical Impression Statement  Arrived late. Carrying her AFO. Discussed importance of wearing AFO when up walking (knee stability, foot clearance, reduce risk of falling). Reviewed need for knee high stocking/sock (cut piece of stockineete for her to use in the  meantime). Will plan to assess LTGs at next visit with plans to discharge from PT.     Rehab Potential  Good    Clinical Impairments Affecting Rehab Potential  Patient's pain tolerance;     PT Frequency  1x / week    PT Duration  Other (comment)   7 weeks   PT Treatment/Interventions  ADLs/Self Care Home Management;Electrical Stimulation;Gait training;DME Instruction;Stair training;Functional mobility training;Therapeutic activities;Therapeutic exercise;Balance training;Neuromuscular re-education;Orthotic Fit/Training;Patient/family education;Scar mobilization;Passive range of motion    PT Next Visit Plan  check LTGs and plan to discontinue;     PT Home Exercise Plan  seated or standing left gastroc stretch; wall slides, standing knee flexion, hip abdct, single LE bridge, ankle and hamstring stretch with strap in supine;     Consulted and Agree with Plan of Care  Patient       Patient will benefit from skilled therapeutic intervention in order to improve the following deficits and impairments:  Abnormal gait, Decreased activity tolerance, Decreased balance, Decreased mobility, Decreased knowledge of use of DME, Decreased range of motion, Decreased skin integrity, Decreased strength, Impaired flexibility, Impaired sensation, Impaired UE functional use, Pain  Visit Diagnosis: Muscle weakness (generalized)  Other abnormalities of gait and mobility  Unsteadiness on feet  Other symptoms and signs involving the nervous system     Problem List Patient Active Problem List   Diagnosis Date Noted  . Smoker 05/06/2016  . Bipolar disorder ([employer]) 05/06/2016    Rexanne Mano, PT 08/22/2018, 8:08 PM  [facility] [address] [place], [place], [address] Phone: [phone]   Fax:  [phone]  Name: [name] MRN: [id number] Date of Birth: [birth year]

## 2018-08-25 ENCOUNTER — Ambulatory Visit: Payer: Medicaid Other | Attending: Nurse Practitioner | Admitting: Occupational Therapy

## 2018-08-25 DIAGNOSIS — M79602 Pain in left arm: Secondary | ICD-10-CM | POA: Insufficient documentation

## 2018-08-25 DIAGNOSIS — R29818 Other symptoms and signs involving the nervous system: Secondary | ICD-10-CM | POA: Insufficient documentation

## 2018-08-25 DIAGNOSIS — M25642 Stiffness of left hand, not elsewhere classified: Secondary | ICD-10-CM | POA: Insufficient documentation

## 2018-08-25 DIAGNOSIS — M25632 Stiffness of left wrist, not elsewhere classified: Secondary | ICD-10-CM | POA: Diagnosis present

## 2018-08-25 NOTE — Therapy (Signed)
[facility] [address] [place], [place], [address] Phone: [phone]   Fax:  [phone]  Occupational Therapy Treatment  Patient Details  Name: [name] MRN: [id number] Date of Birth: 10-Feb-1971 No data recorded  Encounter Date: 08/25/2018  OT End of Session - 08/25/18 1624    Visit Number  10    Number of Visits  17    Date for OT Re-Evaluation  10/21/18    Authorization Type  MCD     Authorization Time Period  Approved 12 visits from 07/05/18 - 08/15/18, approved 8 visits from 9/30-11/24/19    Authorization - Visit Number  1    Authorization - Number of Visits  8    OT Start Time  1315   10 min. heat unbillable   OT Stop Time  1400    OT Time Calculation (min)  45 min    Activity Tolerance  Patient limited by pain       No past medical history on file.  Past Surgical History:  Procedure Laterality Date  . CESAREAN SECTION     fetal distress    There were no vitals filed for this visit.  Subjective Assessment - 08/25/18 1325    Subjective   My hand is getting worse. 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 PMH: Bipolar, cocaine abuse    Limitations  NWB LUE, Fall risk, pt/family to get clarifications on any other restrictions/precautions LUE    Currently in Pain?  Yes    Pain Score  10-Worst pain ever    Pain Location  Foot    Pain Orientation  Right    Pain Descriptors / Indicators  Aching;Burning    Pain Onset  More than a month ago    Pain Frequency  Constant    Aggravating Factors   Walking on it    Pain Relieving Factors  aspercreme rub                   OT Treatments/Exercises (OP) - 08/25/18 0001      ADLs   ADL Comments  Received  clearance for pulsed [place] and estim over skin graft from orthopedic MD who performed skin graft (see updates in EPIC)       Exercises   Exercises  Hand      Hand Exercises   Other Hand Exercises  Following P/ROM, worked on active PIP flexion and extension while MP's blocked in flexion      Modalities   Modalities  Electrical Stimulation;Moist Heat      Moist Heat Therapy   Number Minutes Moist Heat  10 Minutes    Moist Heat Location  Hand;Wrist   at beginning of session     Acupuncturist Stimulation Location  thenar eminence    Electrical Stimulation Action  attempted thumb palmer abduction and opposition but no response    Electrical Stimulation Parameters  50 pps, 250 pw, intensity up to 50 which pt could feel but stopped d/t decr sensation    Electrical Stimulation Goals  Neuromuscular facilitation      Manual Therapy   Manual Therapy  Passive ROM    Passive ROM  Following heat, therapist stretched hand in wrist  extension, PIP extension, and thumb palmer abduction                OT Short Term Goals - 08/11/18 1306      OT SHORT TERM GOAL #1   Title  Independent with HEP to increase ROM Lt hand/wrist - 07/28/18    Baseline  dependent    Time  4    Period  Weeks    Status  Achieved      OT SHORT TERM GOAL #2   Title  Pt to demo full composite flexion Lt hand for grasping     Baseline  80%    Time  4    Period  Weeks    Status  On-going      OT SHORT TERM GOAL #3   Title  Pt to demo approx. 50% finger extension at PIP joints Lt hand in prep for grasping and for releasing objects    Baseline  none (only at MP joints)     Time  4    Period  Weeks    Status  On-going   able to achieve 50% PIP extension only when MP's blocked in flexion     OT SHORT TERM GOAL #4   Title  Pt to demo 90% elbow extension LUE for reaching activities    Baseline  75%    Time  4    Period  Weeks    Status  Achieved      OT SHORT TERM GOAL #5   Title  Pt to  don/doff socks using one handed technique and lace shoes w/ A/E at mod I level    Baseline  dependent    Time  4    Period  Weeks    Status  Achieved   in clinic w/ A/E       OT Long Term Goals - 08/11/18 1307      OT LONG TERM GOAL #1   Title  Pt to be independent with updated HEP - 10/21/18    Baseline  Dependent at this time    Time  8    Period  Weeks    Status  New      OT LONG TERM GOAL #2   Title  Pt to demo 90% active finger extension Lt hand for releasing objects consistently     Baseline  no active PIP extension    Time  8    Period  Weeks    Status  On-going   only at MP's, not PIP's d/t extensive nerve damage     OT LONG TERM GOAL #3   Title  Pt to demo 20 lbs grip strength Lt hand to sufficiently hold and lift weighted objects    Baseline  no grip strength    Time  8    Period  Weeks    Status  New      OT LONG TERM GOAL #4   Title  Pt to demo Lt wrist extension to 30* or greater for functional tasks     Baseline  wrist ext only to neutral    Time  8    Period  Weeks    Status  On-going   08/11/18: 15* extension     OT LONG TERM GOAL #5   Title  Pt to return to simple cooking tasks and light cleaning tasks at mod I level    Baseline  dependent    Time  8  Period  Weeks    Status  New            Plan - 08/25/18 1628    Clinical Impression Statement  Pt with no improvement Lt hand partly d/t extensive nerve damage, and pt not stretching hand at home as instructed.     Occupational Profile and client history currently impacting functional performance  PMH: Bipolar, cocaine abuse    Occupational performance deficits (Please refer to evaluation for details):  ADL's;IADL's;Social Participation    Rehab Potential  Fair    Current Impairments/barriers affecting progress:  severity of deficits, recent skin graft, pain    OT Frequency  1x / week    OT Duration  8 weeks    OT Treatment/Interventions  Self-care/ADL training;Moist Heat;Fluidtherapy;DME  and/or AE instruction;Splinting;Compression bandaging;Therapeutic activities;Ultrasound;Therapeutic exercise;Scar mobilization;Coping strategies training;Functional Mobility Training;Passive range of motion;Electrical Stimulation;Paraffin;Manual Therapy;Patient/family education    Plan  continue progress towards goals    Consulted and Agree with Plan of Care  Patient       Patient will benefit from skilled therapeutic intervention in order to improve the following deficits and impairments:  Decreased coordination, Decreased range of motion, Increased edema, Impaired sensation, Increased fascial restrictions, Decreased skin integrity, Decreased knowledge of precautions, Impaired UE functional use, Pain, Decreased balance, Decreased mobility, Decreased strength, Impaired perceived functional ability  Visit Diagnosis: Other symptoms and signs involving the nervous system  Stiffness of left hand, not elsewhere classified  Stiffness of left wrist, not elsewhere classified    Problem List Patient Active Problem List   Diagnosis Date Noted  . Smoker 05/06/2016  . Bipolar disorder ([employer]) 05/06/2016    Carey Bullocks, OTR/L 08/25/2018, 4:29 PM  [facility] [address] [place], [place], [address] Phone: [phone]   Fax:  [phone]  Name: [name] MRN: [id number] Date of Birth: 1970/12/07

## 2018-08-29 ENCOUNTER — Ambulatory Visit: Payer: Medicaid Other | Admitting: Occupational Therapy

## 2018-08-29 ENCOUNTER — Ambulatory Visit: Payer: Medicaid Other | Admitting: Physical Therapy

## 2018-09-05 ENCOUNTER — Ambulatory Visit: Payer: Medicaid Other | Admitting: Occupational Therapy

## 2018-09-05 ENCOUNTER — Telehealth: Payer: Self-pay

## 2018-09-05 ENCOUNTER — Ambulatory Visit: Payer: Medicaid Other | Admitting: Physical Therapy

## 2018-09-05 NOTE — Telephone Encounter (Signed)
Called and spoke with patient regarding missed O.T. appointment today. Pt reports she overslept. Reminded pt of P.T. appointment later today and need to reschedule O.T. when she comes in.

## 2018-09-12 ENCOUNTER — Ambulatory Visit: Payer: Medicaid Other | Admitting: Occupational Therapy

## 2018-09-12 DIAGNOSIS — R29818 Other symptoms and signs involving the nervous system: Secondary | ICD-10-CM

## 2018-09-12 DIAGNOSIS — M25632 Stiffness of left wrist, not elsewhere classified: Secondary | ICD-10-CM

## 2018-09-12 DIAGNOSIS — M25642 Stiffness of left hand, not elsewhere classified: Secondary | ICD-10-CM

## 2018-09-12 DIAGNOSIS — M79602 Pain in left arm: Secondary | ICD-10-CM

## 2018-09-12 NOTE — Therapy (Signed)
[facility] [address] [place], [place], [address] Phone: [phone]   Fax:  [phone]  Occupational Therapy Treatment  Patient Details  Name: [name] MRN: [id number] Date of Birth: Mar 12, 1971 No data recorded  Encounter Date: 09/12/2018  OT End of Session - 09/12/18 1227    Visit Number  11    Number of Visits  17    Date for OT Re-Evaluation  10/21/18    Authorization Type  MCD     Authorization Time Period  Approved 12 visits from 07/05/18 - 08/15/18, approved 8 visits from 9/30-11/24/19    Authorization - Visit Number  2    Authorization - Number of Visits  8    OT Start Time  0935    OT Stop Time  1020    OT Time Calculation (min)  45 min    Activity Tolerance  Patient limited by pain;Patient tolerated treatment well    Behavior During Therapy  Restless       No past medical history on file.  Past Surgical History:  Procedure Laterality Date  . CESAREAN SECTION     fetal distress    There were no vitals filed for this visit.  Subjective Assessment - 09/12/18 0944    Subjective   My leg is infected; they put me on antibiotics. Wound care has been ordered 4 times. 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 PMH: Bipolar, cocaine abuse    Limitations  NWB LUE, Fall risk, pt/family to get clarifications on any other restrictions/precautions LUE    Currently in Pain?  Yes    Pain Score  2     Pain Location  Foot    Pain Orientation  Left    Pain Descriptors / Indicators  Aching;Burning    Pain Type  Neuropathic pain    Pain Onset  More than a month ago    Pain Frequency  Constant    Aggravating Factors   walking on it    Pain Relieving Factors  aspercreme rub                    OT Treatments/Exercises (OP) - 09/12/18 0001      Hand Exercises   Other Hand Exercises  Following P/ROM, worked on active PIP flexion and extension while MP's blocked in flexion      Modalities   Modalities  Ultrasound      Moist Heat Therapy   Number Minutes Moist Heat  8 Minutes    Moist Heat Location  Hand;Wrist   at beginning of session     Ultrasound   Ultrasound Location  volar forearm    Ultrasound Parameters  0.8 wts/cm2, 3 Mhz, 20% pulsed x 8 min.     Ultrasound Goals  --   scar tissue management, incr circulation     Manual Therapy   Manual Therapy  Passive ROM    Passive ROM  Following heat and ultrasound, therapist stretched hand in wrist extension, PIP extension, and thumb palmer abduction                OT Short Term Goals - 08/11/18 1306      OT SHORT TERM GOAL #1   Title  Independent with HEP  to increase ROM Lt hand/wrist - 07/28/18    Baseline  dependent    Time  4    Period  Weeks    Status  Achieved      OT SHORT TERM GOAL #2   Title  Pt to demo full composite flexion Lt hand for grasping     Baseline  80%    Time  4    Period  Weeks    Status  On-going      OT SHORT TERM GOAL #3   Title  Pt to demo approx. 50% finger extension at PIP joints Lt hand in prep for grasping and for releasing objects    Baseline  none (only at MP joints)     Time  4    Period  Weeks    Status  On-going   able to achieve 50% PIP extension only when MP's blocked in flexion     OT SHORT TERM GOAL #4   Title  Pt to demo 90% elbow extension LUE for reaching activities    Baseline  75%    Time  4    Period  Weeks    Status  Achieved      OT SHORT TERM GOAL #5   Title  Pt to don/doff socks using one handed technique and lace shoes w/ A/E at mod I level    Baseline  dependent    Time  4    Period  Weeks    Status  Achieved   in clinic w/ A/E       OT Long Term Goals - 08/11/18 1307      OT LONG TERM GOAL #1   Title  Pt to  be independent with updated HEP - 10/21/18    Baseline  Dependent at this time    Time  8    Period  Weeks    Status  New      OT LONG TERM GOAL #2   Title  Pt to demo 90% active finger extension Lt hand for releasing objects consistently     Baseline  no active PIP extension    Time  8    Period  Weeks    Status  On-going   only at MP's, not PIP's d/t extensive nerve damage     OT LONG TERM GOAL #3   Title  Pt to demo 20 lbs grip strength Lt hand to sufficiently hold and lift weighted objects    Baseline  no grip strength    Time  8    Period  Weeks    Status  New      OT LONG TERM GOAL #4   Title  Pt to demo Lt wrist extension to 30* or greater for functional tasks     Baseline  wrist ext only to neutral    Time  8    Period  Weeks    Status  On-going   08/11/18: 15* extension     OT LONG TERM GOAL #5   Title  Pt to return to simple cooking tasks and light cleaning tasks at mod I level    Baseline  dependent    Time  8    Period  Weeks    Status  New            Plan - 09/12/18 1228    Clinical Impression Statement  Pt arrives today for the first time in 2 1/2 weeks. Pt very stiff in  Lt hand especially at PIP joints. Pt admittedly reports not doing stretches like she should but says she is doing them. By end of session can passively range digits 3-5 in approx 90% PIP extension, however index finger only approx. 50%    Occupational Profile and client history currently impacting functional performance  PMH: Bipolar, cocaine abuse    Occupational performance deficits (Please refer to evaluation for details):  ADL's;IADL's;Social Participation    Rehab Potential  Fair    Current Impairments/barriers affecting progress:  severity of deficits, recent skin graft, pain    OT Frequency  1x / week    OT Duration  8 weeks    OT Treatment/Interventions  Self-care/ADL training;Moist Heat;Fluidtherapy;DME and/or AE instruction;Splinting;Compression bandaging;Therapeutic  activities;Ultrasound;Therapeutic exercise;Scar mobilization;Coping strategies training;Functional Mobility Training;Passive range of motion;Electrical Stimulation;Paraffin;Manual Therapy;Patient/family education    Plan  continue modalities, A/ROM and P/ROM Lt hand/wrist as able    Consulted and Agree with Plan of Care  Patient       Patient will benefit from skilled therapeutic intervention in order to improve the following deficits and impairments:  Decreased coordination, Decreased range of motion, Increased edema, Impaired sensation, Increased fascial restrictions, Decreased skin integrity, Decreased knowledge of precautions, Impaired UE functional use, Pain, Decreased balance, Decreased mobility, Decreased strength, Impaired perceived functional ability  Visit Diagnosis: Other symptoms and signs involving the nervous system  Stiffness of left hand, not elsewhere classified  Stiffness of left wrist, not elsewhere classified  Pain in left arm    Problem List Patient Active Problem List   Diagnosis Date Noted  . Smoker 05/06/2016  . Bipolar disorder ([place]) 05/06/2016    Carey Bullocks, OTR/L 09/12/2018, 12:31 PM  [facility] [address] [place], [place], [address] Phone: [phone]   Fax:  [phone]  Name: [name] MRN: [id number] Date of Birth: [birth year]

## 2018-09-19 ENCOUNTER — Ambulatory Visit: Payer: Medicaid Other | Admitting: Occupational Therapy

## 2018-09-19 DIAGNOSIS — R29818 Other symptoms and signs involving the nervous system: Secondary | ICD-10-CM | POA: Diagnosis not present

## 2018-09-19 DIAGNOSIS — M79602 Pain in left arm: Secondary | ICD-10-CM

## 2018-09-19 DIAGNOSIS — M25632 Stiffness of left wrist, not elsewhere classified: Secondary | ICD-10-CM

## 2018-09-19 DIAGNOSIS — M25642 Stiffness of left hand, not elsewhere classified: Secondary | ICD-10-CM

## 2018-09-19 NOTE — Therapy (Signed)
[facility] [address] [place], [place], [address] Phone: [phone]   Fax:  [phone]  Occupational Therapy Treatment  Patient Details  Name: [name] MRN: [id number] Date of Birth: 04/12/1971 No data recorded  Encounter Date: 09/19/2018  OT End of Session - 09/19/18 1204    Visit Number  12    Number of Visits  17    Date for OT Re-Evaluation  10/21/18    Authorization Type  MCD     Authorization Time Period  Approved 12 visits from 07/05/18 - 08/15/18, approved 8 visits from 9/30-11/24/19    Authorization - Visit Number  3    Authorization - Number of Visits  8    OT Start Time  6568    OT Stop Time  1145    OT Time Calculation (min)  40 min    Activity Tolerance  Patient limited by pain;Patient tolerated treatment well    Behavior During Therapy  Restless       No past medical history on file.  Past Surgical History:  Procedure Laterality Date  . CESAREAN SECTION     fetal distress    There were no vitals filed for this visit.  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 PMH: Bipolar, cocaine abuse    Limitations  NWB LUE, Fall risk, pt/family to get clarifications on any other restrictions/precautions LUE    Currently in Pain?  Yes    Pain Score  7     Pain Location  Foot   and Lt hand   Pain Orientation  Left    Pain Descriptors / Indicators  Aching;Burning    Pain Type  Neuropathic pain    Pain Onset  More than a month ago    Pain Frequency  Constant    Aggravating Factors   walking on it    Pain Relieving Factors  aspercreme rub                   OT Treatments/Exercises (OP) - 09/19/18 0001      Hand Exercises   Other Hand Exercises  Following P/ROM,  worked on active PIP flexion and extension while MP's blocked in flexion      Ultrasound   Ultrasound Location  volar forearm    Ultrasound Parameters  0.8 wts/cm2, 3 Mhz, 20% pulsed x 8 min    Ultrasound Goals  --   scar tissue management, incr. circulation     Manual Therapy   Manual Therapy  Passive ROM    Passive ROM  Stretched hand in wrist extension, PIP extension, and thumb palmer abduction                OT Short Term Goals - 08/11/18 1306      OT SHORT TERM GOAL #1   Title  Independent with HEP to increase ROM Lt hand/wrist - 07/28/18    Baseline  dependent    Time  4    Period  Weeks    Status  Achieved      OT SHORT TERM GOAL #2   Title  Pt to demo full composite flexion Lt hand for grasping     Baseline  80%    Time  4    Period  Weeks    Status  On-going      OT SHORT TERM GOAL #3   Title  Pt to demo approx. 50% finger extension at PIP joints Lt hand in prep for grasping and for releasing objects    Baseline  none (only at MP joints)     Time  4    Period  Weeks    Status  On-going   able to achieve 50% PIP extension only when MP's blocked in flexion     OT SHORT TERM GOAL #4   Title  Pt to demo 90% elbow extension LUE for reaching activities    Baseline  75%    Time  4    Period  Weeks    Status  Achieved      OT SHORT TERM GOAL #5   Title  Pt to don/doff socks using one handed technique and lace shoes w/ A/E at mod I level    Baseline  dependent    Time  4    Period  Weeks    Status  Achieved   in clinic w/ A/E       OT Long Term Goals - 08/11/18 1307      OT LONG TERM GOAL #1   Title  Pt to be independent with updated HEP - 10/21/18    Baseline  Dependent at this time    Time  8    Period  Weeks    Status  New      OT LONG TERM GOAL #2   Title  Pt to demo 90% active finger extension Lt hand for releasing objects consistently     Baseline  no active PIP extension    Time  8    Period  Weeks    Status  On-going   only at MP's,  not PIP's d/t extensive nerve damage     OT LONG TERM GOAL #3   Title  Pt to demo 20 lbs grip strength Lt hand to sufficiently hold and lift weighted objects    Baseline  no grip strength    Time  8    Period  Weeks    Status  New      OT LONG TERM GOAL #4   Title  Pt to demo Lt wrist extension to 30* or greater for functional tasks     Baseline  wrist ext only to neutral    Time  8    Period  Weeks    Status  On-going   08/11/18: 15* extension     OT LONG TERM GOAL #5   Title  Pt to return to simple cooking tasks and light cleaning tasks at mod I level    Baseline  dependent    Time  8    Period  Weeks    Status  New            Plan - 09/19/18 1205    Clinical Impression Statement  Pt w/ increased passive PIP extension after stretches however arrived very stiff. Dorsal splint still fits properly. Pt has not been wearing pm splint at all and now unable to extend index finger enough in splint    Occupational Profile and client history currently impacting functional performance  PMH: Bipolar, cocaine abuse    Occupational performance deficits (Please refer to evaluation for details):  ADL's;IADL's;Social Participation    Rehab Potential  Fair    Current Impairments/barriers affecting progress:  severity of deficits, skin graft,  pain    OT Frequency  1x / week    OT Duration  8 weeks    OT Treatment/Interventions  Self-care/ADL training;Moist Heat;Fluidtherapy;DME and/or AE instruction;Splinting;Compression bandaging;Therapeutic activities;Ultrasound;Therapeutic exercise;Scar mobilization;Coping strategies training;Functional Mobility Training;Passive range of motion;Electrical Stimulation;Paraffin;Manual Therapy;Patient/family education    Plan  continue modalities, A/ROM and P/ROM Lt hand/wrist as able    Consulted and Agree with Plan of Care  Patient       Patient will benefit from skilled therapeutic intervention in order to improve the following deficits and impairments:   Decreased coordination, Decreased range of motion, Increased edema, Impaired sensation, Increased fascial restrictions, Decreased skin integrity, Decreased knowledge of precautions, Impaired UE functional use, Pain, Decreased balance, Decreased mobility, Decreased strength, Impaired perceived functional ability  Visit Diagnosis: Stiffness of left hand, not elsewhere classified  Stiffness of left wrist, not elsewhere classified  Other symptoms and signs involving the nervous system  Pain in left arm    Problem List Patient Active Problem List   Diagnosis Date Noted  . Smoker 05/06/2016  . Bipolar disorder ([place]) 05/06/2016    Carey Bullocks, OTR/L 09/19/2018, 12:09 PM  [facility] [address] [place], [place], [address] Phone: [phone]   Fax:  [phone]  Name: [name] MRN: [id number] Date of Birth: 1971-09-02

## 2018-09-27 ENCOUNTER — Ambulatory Visit: Payer: Medicaid Other | Attending: Nurse Practitioner | Admitting: Occupational Therapy

## 2018-09-27 DIAGNOSIS — M25632 Stiffness of left wrist, not elsewhere classified: Secondary | ICD-10-CM

## 2018-09-27 DIAGNOSIS — M25642 Stiffness of left hand, not elsewhere classified: Secondary | ICD-10-CM | POA: Insufficient documentation

## 2018-09-27 DIAGNOSIS — R29818 Other symptoms and signs involving the nervous system: Secondary | ICD-10-CM | POA: Diagnosis present

## 2018-09-27 DIAGNOSIS — M6281 Muscle weakness (generalized): Secondary | ICD-10-CM | POA: Diagnosis present

## 2018-09-27 DIAGNOSIS — M79602 Pain in left arm: Secondary | ICD-10-CM

## 2018-09-27 NOTE — Therapy (Signed)
[facility] [address] [place], [place], [address] Phone: [phone]   Fax:  [phone]  Occupational Therapy Treatment  Patient Details  Name: [name] MRN: [id number] Date of Birth: 1971/03/02 No data recorded  Encounter Date: 09/27/2018  OT End of Session - 09/27/18 1412    Visit Number  13    Number of Visits  17    Date for OT Re-Evaluation  10/21/18    Authorization Type  MCD     Authorization Time Period  Approved 12 visits from 07/05/18 - 08/15/18, approved 8 visits from 9/30-11/24/19    Authorization - Visit Number  4    Authorization - Number of Visits  8    OT Start Time  1330    OT Stop Time  1410    OT Time Calculation (min)  40 min    Activity Tolerance  Patient tolerated treatment well    Behavior During Therapy  Restless       No past medical history on file.  Past Surgical History:  Procedure Laterality Date  . CESAREAN SECTION     fetal distress    There were no vitals filed for this visit.  Subjective Assessment - 09/27/18 1328    Subjective   My hand feels like it's trying to work more. 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 PMH: Bipolar, cocaine abuse    Limitations  NWB LUE, Fall risk, pt/family to get clarifications on any other restrictions/precautions LUE    Currently in Pain?  No/denies                   OT Treatments/Exercises (OP) - 09/27/18 0001      ADLs   ADL Comments  Assisted pt in filling out form for disability (O.T. related only)      Ultrasound   Ultrasound Location  volar forearm    Ultrasound Parameters  0.8 wts/cm2, 3 Mhz, 20% pulsed x 8 min    Ultrasound Goals  --   scar tissue management, incr circulation     Manual Therapy   Manual  Therapy  Passive ROM    Passive ROM  Stretched hand in wrist extension, PIP extension w/ MP's blocked in flexion, and thumb palmer abduction and flexion               OT Short Term Goals - 08/11/18 1306      OT SHORT TERM GOAL #1   Title  Independent with HEP to increase ROM Lt hand/wrist - 07/28/18    Baseline  dependent    Time  4    Period  Weeks    Status  Achieved      OT SHORT TERM GOAL #2   Title  Pt to demo full composite flexion Lt hand for grasping     Baseline  80%    Time  4    Period  Weeks    Status  On-going      OT SHORT TERM GOAL #3   Title  Pt to demo approx. 50% finger extension at PIP joints Lt hand in prep for grasping and for releasing objects    Baseline  none (only at MP joints)     Time  4    Period  Weeks    Status  On-going   able to achieve 50% PIP extension only when MP's blocked in flexion     OT SHORT TERM GOAL #4   Title  Pt to demo 90% elbow extension LUE for reaching activities    Baseline  75%    Time  4    Period  Weeks    Status  Achieved      OT SHORT TERM GOAL #5   Title  Pt to don/doff socks using one handed technique and lace shoes w/ A/E at mod I level    Baseline  dependent    Time  4    Period  Weeks    Status  Achieved   in clinic w/ A/E       OT Long Term Goals - 08/11/18 1307      OT LONG TERM GOAL #1   Title  Pt to be independent with updated HEP - 10/21/18    Baseline  Dependent at this time    Time  8    Period  Weeks    Status  New      OT LONG TERM GOAL #2   Title  Pt to demo 90% active finger extension Lt hand for releasing objects consistently     Baseline  no active PIP extension    Time  8    Period  Weeks    Status  On-going   only at MP's, not PIP's d/t extensive nerve damage     OT LONG TERM GOAL #3   Title  Pt to demo 20 lbs grip strength Lt hand to sufficiently hold and lift weighted objects    Baseline  no grip strength    Time  8    Period  Weeks    Status  New      OT LONG TERM  GOAL #4   Title  Pt to demo Lt wrist extension to 30* or greater for functional tasks     Baseline  wrist ext only to neutral    Time  8    Period  Weeks    Status  On-going   08/11/18: 15* extension     OT LONG TERM GOAL #5   Title  Pt to return to simple cooking tasks and light cleaning tasks at mod I level    Baseline  dependent    Time  8    Period  Weeks    Status  New            Plan - 09/27/18 1415    Clinical Impression Statement  Pt with greater active motion of hand today at MP joints (still no active PIP extension unless MP's blocked in flexion, and only minimal then) and slightly more thumb movement as well. Pt beginning to show some palmer abduction w/ compensations    Occupational Profile and client history currently impacting functional performance  PMH: Bipolar, cocaine abuse    Occupational performance deficits (Please refer to evaluation for details):  ADL's;IADL's;Social Participation    Rehab Potential  Fair    Current Impairments/barriers affecting progress:  severity of deficits, skin graft, pain    OT Frequency  1x / week    OT Duration  8 weeks    OT Treatment/Interventions  Self-care/ADL training;Moist Heat;Fluidtherapy;DME and/or AE instruction;Splinting;Compression bandaging;Therapeutic activities;Ultrasound;Therapeutic exercise;Scar mobilization;Coping strategies training;Functional Mobility Training;Passive range of motion;Electrical Stimulation;Paraffin;Manual Therapy;Patient/family education    Plan  continue modalities, A/ROM and P/ROM Lt hand/wrist as able,  anticipate d/c by end of month    Consulted and Agree with Plan of Care  Patient       Patient will benefit from skilled therapeutic intervention in order to improve the following deficits and impairments:  Decreased coordination, Decreased range of motion, Increased edema, Impaired sensation, Increased fascial restrictions, Decreased skin integrity, Decreased knowledge of precautions, Impaired  UE functional use, Pain, Decreased balance, Decreased mobility, Decreased strength, Impaired perceived functional ability  Visit Diagnosis: Stiffness of left hand, not elsewhere classified  Stiffness of left wrist, not elsewhere classified  Other symptoms and signs involving the nervous system  Pain in left arm  Muscle weakness (generalized)    Problem List Patient Active Problem List   Diagnosis Date Noted  . Smoker 05/06/2016  . Bipolar disorder ([place]) 05/06/2016    Carey Bullocks, OTR/L 09/27/2018, 2:18 PM  [facility] [address] [place], [place], [address] Phone: [phone]   Fax:  [phone]  Name: PERI KREFT MRN: [id number] Date of Birth: [birth year]

## 2018-10-03 ENCOUNTER — Ambulatory Visit: Payer: Medicaid Other | Admitting: Occupational Therapy

## 2018-10-03 DIAGNOSIS — M25632 Stiffness of left wrist, not elsewhere classified: Secondary | ICD-10-CM

## 2018-10-03 DIAGNOSIS — M25642 Stiffness of left hand, not elsewhere classified: Secondary | ICD-10-CM

## 2018-10-03 DIAGNOSIS — M79602 Pain in left arm: Secondary | ICD-10-CM

## 2018-10-03 DIAGNOSIS — R29818 Other symptoms and signs involving the nervous system: Secondary | ICD-10-CM

## 2018-10-03 NOTE — Therapy (Signed)
[facility] [address] [place], [place], [address] Phone: [phone]   Fax:  [phone]  Occupational Therapy Treatment  Patient Details  Name: BASSY FETTERLY MRN: [id number] Date of Birth: 02/10/1971 No data recorded  Encounter Date: 10/03/2018  OT End of Session - 10/03/18 1414    Visit Number  14    Number of Visits  17    Date for OT Re-Evaluation  10/21/18    Authorization Type  MCD     Authorization Time Period  Approved 12 visits from 07/05/18 - 08/15/18, approved 8 visits from 9/30-11/24/19    Authorization - Visit Number  5    Authorization - Number of Visits  8    OT Start Time  1318    OT Stop Time  1408    OT Time Calculation (min)  50 min    Activity Tolerance  Patient tolerated treatment well    Behavior During Therapy  Restless       No past medical history on file.  Past Surgical History:  Procedure Laterality Date  . CESAREAN SECTION     fetal distress    There were no vitals filed for this visit.  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 PMH: Bipolar, cocaine abuse    Limitations  NWB LUE, Fall risk, pt/family to get clarifications on any other restrictions/precautions LUE    Currently in Pain?  No/denies                   OT Treatments/Exercises (OP) - 10/03/18 0001      ADLs   ADL Comments  Discussed d/c at next session. Pt with lack of improvement and will most likely need surgical intervention to increase Lt hand ROM/function d/t severity of nerve damage.       Ultrasound   Ultrasound Location  volar forearm    Ultrasound Parameters  1.0 wts/cm2, 3 Mhz, 20% pulsed x 8 min.    Ultrasound Goals  --   scar tissue management, incr. circulation     Splinting   Splinting  Adjusted resting hand splint as pt can no longer get sufficient PIP extension to don splint as is. May need further adjustment if still unable to fully don      Manual Therapy   Manual Therapy  Passive ROM    Passive ROM  Stretched hand in wrist extension, PIP extension w/ MP's blocked in flexion, and thumb palmer abduction and flexion               OT Short Term Goals - 08/11/18 1306      OT SHORT TERM GOAL #1   Title  Independent with HEP to increase ROM Lt hand/wrist - 07/28/18    Baseline  dependent    Time  4    Period  Weeks    Status  Achieved      OT SHORT TERM GOAL #2   Title  Pt to demo full composite flexion Lt hand for grasping     Baseline  80%    Time  4    Period  Weeks    Status  On-going      OT SHORT TERM GOAL #3   Title  Pt to demo approx. 50% finger extension  at PIP joints Lt hand in prep for grasping and for releasing objects    Baseline  none (only at MP joints)     Time  4    Period  Weeks    Status  On-going   able to achieve 50% PIP extension only when MP's blocked in flexion     OT SHORT TERM GOAL #4   Title  Pt to demo 90% elbow extension LUE for reaching activities    Baseline  75%    Time  4    Period  Weeks    Status  Achieved      OT SHORT TERM GOAL #5   Title  Pt to don/doff socks using one handed technique and lace shoes w/ A/E at mod I level    Baseline  dependent    Time  4    Period  Weeks    Status  Achieved   in clinic w/ A/E       OT Long Term Goals - 08/11/18 1307      OT LONG TERM GOAL #1   Title  Pt to be independent with updated HEP - 10/21/18    Baseline  Dependent at this time    Time  8    Period  Weeks    Status  New      OT LONG TERM GOAL #2   Title  Pt to demo 90% active finger extension Lt hand for releasing objects consistently     Baseline  no active PIP extension    Time  8    Period  Weeks    Status  On-going   only at MP's, not PIP's d/t extensive nerve damage     OT  LONG TERM GOAL #3   Title  Pt to demo 20 lbs grip strength Lt hand to sufficiently hold and lift weighted objects    Baseline  no grip strength    Time  8    Period  Weeks    Status  New      OT LONG TERM GOAL #4   Title  Pt to demo Lt wrist extension to 30* or greater for functional tasks     Baseline  wrist ext only to neutral    Time  8    Period  Weeks    Status  On-going   08/11/18: 15* extension     OT LONG TERM GOAL #5   Title  Pt to return to simple cooking tasks and light cleaning tasks at mod I level    Baseline  dependent    Time  8    Period  Weeks    Status  New            Plan - 10/03/18 1414    Clinical Impression Statement  Pt reports hand feeling very cold. No active PIP extension and limited thumb motion    Occupational Profile and client history currently impacting functional performance  PMH: Bipolar, cocaine abuse    Occupational performance deficits (Please refer to evaluation for details):  ADL's;IADL's;Social Participation    Rehab Potential  Fair    Current Impairments/barriers affecting progress:  severity of deficits, skin graft, pain    OT Frequency  1x / week    OT Duration  8 weeks    OT Treatment/Interventions  Self-care/ADL training;Moist Heat;Fluidtherapy;DME and/or AE instruction;Splinting;Compression bandaging;Therapeutic activities;Ultrasound;Therapeutic exercise;Scar mobilization;Coping strategies training;Functional Mobility Training;Passive range of motion;Electrical Stimulation;Paraffin;Manual Therapy;Patient/family education    Plan  assess  remaining LTG's and d/c. May need to adjust resting hand splint further    Consulted and Agree with Plan of Care  Patient       Patient will benefit from skilled therapeutic intervention in order to improve the following deficits and impairments:  Decreased coordination, Decreased range of motion, Increased edema, Impaired sensation, Increased fascial restrictions, Decreased skin integrity,  Decreased knowledge of precautions, Impaired UE functional use, Pain, Decreased balance, Decreased mobility, Decreased strength, Impaired perceived functional ability  Visit Diagnosis: Stiffness of left hand, not elsewhere classified  Stiffness of left wrist, not elsewhere classified  Other symptoms and signs involving the nervous system  Pain in left arm    Problem List Patient Active Problem List   Diagnosis Date Noted  . Smoker 05/06/2016  . Bipolar disorder ([place]) 05/06/2016    Carey Bullocks, OTR/L 10/03/2018, 2:16 PM  [facility] [address] [place], [place], [address] Phone: [phone]   Fax:  [phone]  Name: TORINA EY MRN: [id number] Date of Birth: [birth year]

## 2018-10-11 ENCOUNTER — Ambulatory Visit: Payer: Medicaid Other | Admitting: Occupational Therapy

## 2018-10-12 ENCOUNTER — Encounter (HOSPITAL_BASED_OUTPATIENT_CLINIC_OR_DEPARTMENT_OTHER): Payer: Medicaid Other | Attending: Internal Medicine

## 2018-10-12 DIAGNOSIS — L97829 Non-pressure chronic ulcer of other part of left lower leg with unspecified severity: Secondary | ICD-10-CM | POA: Diagnosis not present

## 2018-10-12 DIAGNOSIS — F17200 Nicotine dependence, unspecified, uncomplicated: Secondary | ICD-10-CM | POA: Diagnosis not present

## 2018-10-12 DIAGNOSIS — G8220 Paraplegia, unspecified: Secondary | ICD-10-CM | POA: Diagnosis not present

## 2018-10-24 ENCOUNTER — Encounter: Payer: Self-pay | Admitting: *Deleted

## 2018-10-25 ENCOUNTER — Ambulatory Visit: Payer: Medicaid Other | Admitting: Diagnostic Neuroimaging

## 2018-10-25 ENCOUNTER — Other Ambulatory Visit: Payer: Self-pay

## 2018-10-25 ENCOUNTER — Encounter: Payer: Self-pay | Admitting: Diagnostic Neuroimaging

## 2018-10-25 ENCOUNTER — Encounter (HOSPITAL_BASED_OUTPATIENT_CLINIC_OR_DEPARTMENT_OTHER): Payer: Self-pay | Admitting: *Deleted

## 2018-10-25 VITALS — BP 115/75 | HR 106 | Ht 61.0 in | Wt 91.8 lb

## 2018-10-25 DIAGNOSIS — M48061 Spinal stenosis, lumbar region without neurogenic claudication: Secondary | ICD-10-CM

## 2018-10-25 DIAGNOSIS — R29898 Other symptoms and signs involving the musculoskeletal system: Secondary | ICD-10-CM | POA: Diagnosis not present

## 2018-10-25 NOTE — Progress Notes (Signed)
[facility]  PATIENT: [name] DOB: 12/25/1970  REFERRING CLINICIAN: Tonette Bihari, FNP HISTORY FROM: patient  REASON FOR VISIT: new consult   HISTORICAL  CHIEF COMPLAINT:  Chief Complaint  Patient presents with  . Left foot drop    rm 7 New Pt    HISTORY OF PRESENT ILLNESS:   47 year old female here for evaluation of left leg weakness.  In July 2019 patient was intoxicated, fell down steps and was knocked unconscious.  He laid there for 1 to 2 days, semi-conscious.  She developed rhabdomyolysis, left arm and left leg weakness, confusion, but somehow was able to go to the hospital.  She was admitted to the [facility] hospital for evaluation.  She was found to have acute kidney injury, rhabdomyolysis, compartment syndrome affecting the left upper extremity.  She was also found to have left lower extremity weakness thought to be related to lumbar spinal stenosis versus peripheral neuropathy, exacerbated due to trauma.  She underwent multiple surgeries for her left arm.  She was given physical therapy and AFO for her left foot drop weakness.  She was supposed to follow-up with neurosurgery clinic at [facility], but was not able to make it due to transportation and other factors.  Now patient presenting to our clinic for follow-up.  Patient has started to have some movement in her left foot/toes.  Otherwise she still has significant numbness and pain in her left foot.  Her left hand and arm are still affected as well.    REVIEW OF SYSTEMS: Full 14 system review of systems performed and negative with exception of: As per HPI.  ALLERGIES: No Known Allergies  HOME MEDICATIONS: Outpatient Medications Prior to Visit  Medication Sig Dispense Refill  . baclofen (LIORESAL) 10 MG tablet Take 10 mg by mouth 3 (three) times daily.    . Buprenorphine HCl 75 MCG FILM Place inside cheek as needed.    . gabapentin (NEURONTIN) 300 MG capsule 300 mg 3 (three) times daily.  2  .  OLANZapine (ZYPREXA) 5 MG tablet TK 1 T PO HS FOR MOOD  1  . gabapentin (NEURONTIN) 100 MG capsule Take 100-300 mg by mouth 3 (three) times daily.    Marland Kitchen lidocaine (LIDODERM) 5 % Place 1 patch onto the skin daily. Remove & Discard patch within 12 hours or as directed by MD (Patient not taking: Reported on 07/20/2018) 30 patch 0   No facility-administered medications prior to visit.     PAST MEDICAL HISTORY: Past Medical History:  Diagnosis Date  . Bipolar 1 disorder ([place])    followed by psychiatry  . 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 ETOH abuse   . Foot drop, left   . Foot drop, left foot   . Polysubstance abuse ([place])    history of  . Smoker   . Swallowing difficulty    cannot swallow pills  . Teeth decayed   . Vitamin D deficiency     PAST SURGICAL HISTORY: Past Surgical History:  Procedure Laterality Date  . arm surgery Left    compartment syndrome surgeries  . CESAREAN SECTION     fetal distress  . HAND SURGERY     for fracture  . TONSILLECTOMY      FAMILY HISTORY: Family History  Problem Relation Age of Onset  . Cancer Mother        OVARIAN, NON-HODGKINS    SOCIAL HISTORY: Social History   Socioeconomic History  . Marital status: Single  Spouse name: Not on file  . Number of children: 2  . Years of education: Not on file  . Highest education level: Bachelor's degree (e.g., BA, AB, BS)  Occupational History  . Not on file  Social Needs  . Financial resource strain: Not on file  . Food insecurity:    Worry: Not on file    Inability: Not on file  . Transportation needs:    Medical: Not on file    Non-medical: Not on file  Tobacco Use  . Smoking status: Current Every Day Smoker    Packs/day: 1.00    Types: Cigarettes  . Smokeless tobacco: Never Used  Substance and Sexual Activity  . Alcohol use: Yes    Comment: beer 2-4 per day  . Drug use: Yes    Types: Cocaine, Marijuana, "Crack" cocaine    Comment: LSD- last 1 week ago, 10/25/18  unsure the last time she used   . Sexual activity: Yes    Birth control/protection: None  Lifestyle  . Physical activity:    Days per week: Not on file    Minutes per session: Not on file  . Stress: Not on file  Relationships  . Social connections:    Talks on phone: Not on file    Gets together: Not on file    Attends religious service: Not on file    Active member of club or organization: Not on file    Attends meetings of clubs or organizations: Not on file    Relationship status: Not on file  . Intimate partner violence:    Fear of current or ex partner: Not on file    Emotionally abused: Not on file    Physically abused: Not on file    Forced sexual activity: Not on file  Other Topics Concern  . Not on file  Social History Narrative   Lives with youngest son, house guest   Caffeine- coffee, 1 cup daily     PHYSICAL EXAM  GENERAL EXAM/CONSTITUTIONAL: Vitals:  Vitals:   10/25/18 1446  BP: 115/75  Pulse: (!) 106  Weight: 91 lb 12.8 oz (41.6 kg)  Height: '5\' 1"'  (1.549 m)     Body mass index is 17.35 kg/m. Wt Readings from Last 3 Encounters:  10/25/18 91 lb 12.8 oz (41.6 kg)  05/06/16 94 lb 3.2 oz (42.7 kg)  03/02/16 94 lb (42.6 kg)     Patient is in no distress; well developed, nourished and groomed; neck is supple  CARDIOVASCULAR:  Examination of carotid arteries is normal; no carotid bruits  Regular rate and rhythm, no murmurs  Examination of peripheral vascular system by observation and palpation is normal  EYES:  Ophthalmoscopic exam of optic discs and posterior segments is normal; no papilledema or hemorrhages  Visual Acuity Screening   Right eye Left eye Both eyes  Without correction: 20/20 20/20   With correction:        MUSCULOSKELETAL:  Gait, strength, tone, movements noted in Neurologic exam below  NEUROLOGIC: MENTAL STATUS:  No flowsheet data found.  awake, alert, oriented to person, place and time  recent and remote memory  intact  normal attention and concentration  language fluent, comprehension intact, naming intact  fund of knowledge appropriate  CRANIAL NERVE:   2nd - no papilledema on fundoscopic exam  2nd, 3rd, 4th, 6th - pupils equal and reactive to light, visual fields full to confrontation, extraocular muscles intact, no nystagmus  5th - facial sensation symmetric  7th -  facial strength symmetric  8th - hearing intact  9th - palate elevates symmetrically, uvula midline  11th - shoulder shrug symmetric  12th - tongue protrusion midline  MOTOR:   normal bulk and tone, full strength in the RUE, RLE  LUE 5 EXCEPT WRIST EXT 2-3, FINGER EXT 1-2; LEFT HAND DIGITS CONTRACTED; SEQUELAE FROM PRIOR COMPARTMENT SYNDROME AND FASCIOTOMY  LLE 5 EXCEPT DORSIFLEX AND PLANTAR FLEXION 1/5  SENSORY:   normal and symmetric to light touch; ABSENT VIB IN LEFT FOOT  COORDINATION:   finger-nose-finger, fine finger movements normal  REFLEXES:   deep tendon reflexes TRACE and symmetric  GAIT/STATION:   narrow based gait; LEFT FOOT DROP GAIT; USES 4 POINT CANE     DIAGNOSTIC DATA (LABS, IMAGING, TESTING) - I reviewed patient records, labs, notes, testing and imaging myself where available.  Lab Results  Component Value Date   WBC 12.4 (H) 01/27/2008   HGB 10.2 (L) 01/27/2008   HCT 28.8 (L) 01/27/2008   MCV 92.1 01/27/2008   PLT 215 01/27/2008      Component Value Date/Time   NA 135 12/15/2007 1918   K 3.2 (L) 12/15/2007 1918   CL 102 12/15/2007 1918   CO2 25 12/15/2007 1918   GLUCOSE 100 (H) 12/15/2007 1918   BUN 4 (L) 12/15/2007 1918   CREATININE 0.66 12/15/2007 1918   CALCIUM 8.6 12/15/2007 1918   PROT 5.5 (L) 12/15/2007 1918   ALBUMIN 2.7 (L) 12/15/2007 1918   AST 17 12/15/2007 1918   ALT 14 12/15/2007 1918   ALKPHOS 93 12/15/2007 1918   BILITOT 0.4 12/15/2007 1918   GFRNONAA >60 12/15/2007 1918   GFRAA  12/15/2007 1918    >60        The eGFR has been calculated using  the MDRD equation. This calculation has not been validated in all clinical   No results found for: CHOL, HDL, LDLCALC, LDLDIRECT, TRIG, CHOLHDL No results found for: HGBA1C No results found for: VITAMINB12 No results found for: TSH   06/01/18 MRI lumbar spine [report only] 1.No evidence of acute fracture involving the thoracolumbar spine.  2.Redemonstrated bilateral L5 pars defects with grade II anterolisthesis of L5 on S1, favored chronic. This results in moderate spinal canal stenosis as well as moderately severe left and moderate right foraminal stenosis at this level with associated mass effect on the left greater than right exiting L5 nerve root. 3.Partially imaged presacral edema of uncertain etiology, which was not seen on prior CT. Dedicated MRI of the pelvis can be performed for further evaluation if clinically indicated.    ASSESSMENT AND PLAN  47 y.o. 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    Dx:  1. Left leg weakness   2. 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 Salomon Fick; [facility] Perch) re: lumbar spinal stenosis if patient is a surgical candidate  Return if symptoms worsen or fail to improve, for return to PCP.    Penni Bombard, MD 68/11/1570, 6:20 PM Certified in Neurology, Neurophysiology and Neuroimaging  [facility] [address], [place], [place] [address] [phone]

## 2018-10-25 NOTE — H&P (Signed)
HISTORY AND PHYSICAL  [name] is a 47 y.o. female patient with JO:ACZYSAYT by general dentist for removal nonrestorable teeth  No diagnosis found.  Past Medical History:  Diagnosis Date  . Bipolar 1 disorder ([place])    followed by psychiatry  . Compartment syndrome ([place])    left arm  . ETOH abuse   . Foot drop, left   . Foot drop, left foot   . Polysubstance abuse ([place])    history of  . Smoker   . Teeth decayed   . Vitamin D deficiency     No current facility-administered medications for this encounter.    Current Outpatient Medications  Medication Sig Dispense Refill  . baclofen (LIORESAL) 10 MG tablet Take 10 mg by mouth 3 (three) times daily.    Marland Kitchen gabapentin (NEURONTIN) 100 MG capsule Take 100-300 mg by mouth 3 (three) times daily.    Marland Kitchen lidocaine (LIDODERM) 5 % Place 1 patch onto the skin daily. Remove & Discard patch within 12 hours or as directed by MD (Patient not taking: Reported on 07/20/2018) 30 patch 0   No Known Allergies Active Problems:   * No active hospital problems. *  Vitals: Height 5\' 1"  (1.549 m), weight 43.1 kg. Lab results:No results found for this or any previous visit (from the past 24 hour(s)). Radiology Results: No results found. General appearance: alert, cooperative and no distress Head: Normocephalic, without obvious abnormality, atraumatic Eyes: negative Nose: Nares normal. Septum midline. Mucosa normal. No drainage or sinus tenderness. Throat: carious teeth #18, 21, 22, 27, 28; pharynx clear, no trismus Neck: no adenopathy, supple, symmetrical, trachea midline and thyroid not enlarged, symmetric, no tenderness/mass/nodules Resp: clear to auscultation bilaterally Cardio: regular rate and rhythm, S1, S2 normal, no murmur, click, rub or gallop  Assessment: Nonrestorable teeth secondary to dental caries  Plan: Dental extractions with GA. Day surgery.    Diona Browner 10/25/2018

## 2018-10-25 NOTE — Patient Instructions (Signed)
-   consider follow up with neurosurgery (Dr. Salomon Fick; Phoebe Perch) re: lumbar spinal stenosis

## 2018-10-26 ENCOUNTER — Ambulatory Visit (HOSPITAL_BASED_OUTPATIENT_CLINIC_OR_DEPARTMENT_OTHER): Payer: Medicaid Other | Admitting: Anesthesiology

## 2018-10-26 ENCOUNTER — Ambulatory Visit (HOSPITAL_BASED_OUTPATIENT_CLINIC_OR_DEPARTMENT_OTHER)
Admission: RE | Admit: 2018-10-26 | Discharge: 2018-10-26 | Disposition: A | Discharge status: Home / Self Care | Payer: Medicaid Other | Source: Ambulatory Visit | Attending: Oral Surgery | Admitting: Oral Surgery

## 2018-10-26 ENCOUNTER — Encounter (HOSPITAL_BASED_OUTPATIENT_CLINIC_OR_DEPARTMENT_OTHER)
Admission: RE | Disposition: A | Discharge status: Home / Self Care | Payer: Self-pay | Source: Ambulatory Visit | Attending: Oral Surgery

## 2018-10-26 ENCOUNTER — Encounter (HOSPITAL_BASED_OUTPATIENT_CLINIC_OR_DEPARTMENT_OTHER): Payer: Self-pay | Admitting: *Deleted

## 2018-10-26 ENCOUNTER — Other Ambulatory Visit: Payer: Self-pay

## 2018-10-26 DIAGNOSIS — K029 Dental caries, unspecified: Secondary | ICD-10-CM | POA: Insufficient documentation

## 2018-10-26 DIAGNOSIS — F319 Bipolar disorder, unspecified: Secondary | ICD-10-CM | POA: Insufficient documentation

## 2018-10-26 DIAGNOSIS — F17200 Nicotine dependence, unspecified, uncomplicated: Secondary | ICD-10-CM | POA: Insufficient documentation

## 2018-10-26 DIAGNOSIS — Z79899 Other long term (current) drug therapy: Secondary | ICD-10-CM | POA: Insufficient documentation

## 2018-10-26 HISTORY — DX: Dental caries, unspecified: K02.9

## 2018-10-26 HISTORY — PX: TOOTH EXTRACTION: SHX859

## 2018-10-26 HISTORY — DX: Alcohol abuse, uncomplicated: F10.10

## 2018-10-26 HISTORY — DX: Compartment syndrome, unspecified, initial encounter: T79.A0XA

## 2018-10-26 HISTORY — DX: Nicotine dependence, unspecified, uncomplicated: F17.200

## 2018-10-26 HISTORY — DX: Foot drop, left foot: M21.372

## 2018-10-26 HISTORY — DX: Chronic kidney disease, unspecified: N18.9

## 2018-10-26 SURGERY — EXTRACTION, TOOTH, MOLAR
Anesthesia: General | Site: Mouth | Laterality: Bilateral

## 2018-10-26 MED ORDER — CEFAZOLIN SODIUM-DEXTROSE 2-4 GM/100ML-% IV SOLN
INTRAVENOUS | Status: AC
  Filled 2018-10-26: qty 100

## 2018-10-26 MED ORDER — FENTANYL CITRATE (PF) 100 MCG/2ML IJ SOLN
50.0000 ug | INTRAMUSCULAR | Status: DC | PRN
  Administered 2018-10-26: 100 ug via INTRAVENOUS

## 2018-10-26 MED ORDER — LIDOCAINE-EPINEPHRINE 2 %-1:100000 IJ SOLN
INTRAMUSCULAR | Status: DC | PRN
  Administered 2018-10-26: 9 mL via INTRADERMAL

## 2018-10-26 MED ORDER — FENTANYL CITRATE (PF) 100 MCG/2ML IJ SOLN
INTRAMUSCULAR | Status: AC
  Filled 2018-10-26: qty 2

## 2018-10-26 MED ORDER — SUGAMMADEX SODIUM 200 MG/2ML IV SOLN
INTRAVENOUS | Status: DC | PRN
  Administered 2018-10-26: 200 mg via INTRAVENOUS

## 2018-10-26 MED ORDER — LIDOCAINE HCL (CARDIAC) PF 100 MG/5ML IV SOSY
PREFILLED_SYRINGE | INTRAVENOUS | Status: DC | PRN
  Administered 2018-10-26: 60 mg via INTRAVENOUS

## 2018-10-26 MED ORDER — ROCURONIUM BROMIDE 100 MG/10ML IV SOLN
INTRAVENOUS | Status: DC | PRN
  Administered 2018-10-26: 25 mg via INTRAVENOUS

## 2018-10-26 MED ORDER — MIDAZOLAM HCL 2 MG/2ML IJ SOLN
1.0000 mg | INTRAMUSCULAR | Status: DC | PRN
  Administered 2018-10-26: 2 mg via INTRAVENOUS

## 2018-10-26 MED ORDER — LACTATED RINGERS IV SOLN
INTRAVENOUS | Status: DC
  Administered 2018-10-26: 09:00:00 via INTRAVENOUS

## 2018-10-26 MED ORDER — SCOPOLAMINE 1 MG/3DAYS TD PT72: 1.0000 | MEDICATED_PATCH | Freq: Once | TRANSDERMAL | Status: DC | PRN

## 2018-10-26 MED ORDER — DEXAMETHASONE SODIUM PHOSPHATE 4 MG/ML IJ SOLN
INTRAMUSCULAR | Status: DC | PRN
  Administered 2018-10-26: 10 mg via INTRAVENOUS

## 2018-10-26 MED ORDER — HYDROCODONE-ACETAMINOPHEN 5-325 MG PO TABS: 1.0000 | ORAL_TABLET | Freq: Four times a day (QID) | ORAL | 0 refills | Status: DC | PRN

## 2018-10-26 MED ORDER — MIDAZOLAM HCL 2 MG/2ML IJ SOLN
INTRAMUSCULAR | Status: AC
  Filled 2018-10-26: qty 2

## 2018-10-26 MED ORDER — PROPOFOL 10 MG/ML IV BOLUS
INTRAVENOUS | Status: DC | PRN
  Administered 2018-10-26: 100 mg via INTRAVENOUS

## 2018-10-26 MED ORDER — CEFAZOLIN SODIUM-DEXTROSE 2-4 GM/100ML-% IV SOLN
2.0000 g | INTRAVENOUS | Status: AC
  Administered 2018-10-26: 2 g via INTRAVENOUS

## 2018-10-26 SURGICAL SUPPLY — 44 items
BANDAGE COBAN STERILE 2 (GAUZE/BANDAGES/DRESSINGS) IMPLANT
BLADE SURG 15 STRL LF DISP TIS (BLADE) ×2 IMPLANT
BLADE SURG 15 STRL SS (BLADE) ×4
BNDG EYE OVAL (GAUZE/BANDAGES/DRESSINGS) IMPLANT
BUR CROSS CUT FISSURE 1.6 (BURR) ×3 IMPLANT
BUR CROSS CUT FISSURE 1.6MM (BURR) ×1
BUR EGG ELITE 4.0 (BURR) IMPLANT
BUR EGG ELITE 4.0MM (BURR)
CANISTER SUCT 1200ML W/VALVE (MISCELLANEOUS) ×4 IMPLANT
CATH ROBINSON RED A/P 10FR (CATHETERS) IMPLANT
COVER BACK TABLE 60X90IN (DRAPES) ×4 IMPLANT
COVER MAYO STAND STRL (DRAPES) ×4 IMPLANT
COVER WAND RF STERILE (DRAPES) IMPLANT
DECANTER SPIKE VIAL GLASS SM (MISCELLANEOUS) IMPLANT
DRAPE U-SHAPE 76X120 STRL (DRAPES) ×4 IMPLANT
GAUZE PACKING FOLDED 2  STR (GAUZE/BANDAGES/DRESSINGS) ×2
GAUZE PACKING FOLDED 2 STR (GAUZE/BANDAGES/DRESSINGS) ×2 IMPLANT
GAUZE PACKING IODOFORM 1/4X15 (GAUZE/BANDAGES/DRESSINGS) IMPLANT
GLOVE BIO SURGEON STRL SZ 6.5 (GLOVE) ×3 IMPLANT
GLOVE BIO SURGEON STRL SZ7.5 (GLOVE) ×4 IMPLANT
GLOVE BIO SURGEONS STRL SZ 6.5 (GLOVE) ×1
GLOVE BIOGEL PI IND STRL 6.5 (GLOVE) ×2 IMPLANT
GLOVE BIOGEL PI INDICATOR 6.5 (GLOVE) ×2
GOWN STRL REUS W/ TWL LRG LVL3 (GOWN DISPOSABLE) ×2 IMPLANT
GOWN STRL REUS W/ TWL XL LVL3 (GOWN DISPOSABLE) ×2 IMPLANT
GOWN STRL REUS W/TWL LRG LVL3 (GOWN DISPOSABLE) ×4
GOWN STRL REUS W/TWL XL LVL3 (GOWN DISPOSABLE) ×4
IV NS 500ML (IV SOLUTION) ×4
IV NS 500ML BAXH (IV SOLUTION) ×2 IMPLANT
NEEDLE HYPO 22GX1.5 SAFETY (NEEDLE) ×4 IMPLANT
NS IRRIG 1000ML POUR BTL (IV SOLUTION) ×4 IMPLANT
PACK BASIN DAY SURGERY FS (CUSTOM PROCEDURE TRAY) ×4 IMPLANT
SLEEVE SCD COMPRESS KNEE MED (MISCELLANEOUS) ×3 IMPLANT
SPONGE SURGIFOAM ABS GEL 12-7 (HEMOSTASIS) IMPLANT
SUT CHROMIC 3 0 PS 2 (SUTURE) ×4 IMPLANT
SYR BULB 3OZ (MISCELLANEOUS) ×4 IMPLANT
SYR CONTROL 10ML LL (SYRINGE) ×4 IMPLANT
TOOTHBRUSH ADULT (PERSONAL CARE ITEMS) IMPLANT
TOWEL GREEN STERILE FF (TOWEL DISPOSABLE) ×4 IMPLANT
TRAY DSU PREP LF (CUSTOM PROCEDURE TRAY) IMPLANT
TUBE CONNECTING 20'X1/4 (TUBING) ×1
TUBE CONNECTING 20X1/4 (TUBING) ×3 IMPLANT
TUBING IRRIGATION (MISCELLANEOUS) ×1 IMPLANT
YANKAUER SUCT BULB TIP NO VENT (SUCTIONS) ×4 IMPLANT

## 2018-10-26 NOTE — Op Note (Signed)
NAME: Wendy, [name] MEDICAL RECORD [id number] ACCOUNT [id number] DATE OF BIRTH:1971/07/07 FACILITY: [facility] LOCATION: [facility] PHYSICIAN:Kaicen Desena M. Keilah Lemire, DDS  OPERATIVE REPORT  DATE OF PROCEDURE:  10/26/2018  PREOPERATIVE DIAGNOSIS: Nonrestorable teeth secondary to dental caries #18, 21, 22, 27, 28.  POSTOPERATIVE DIAGNOSIS:  Nonrestorable teeth secondary to dental caries #18, 21, 22, 27, 28.  PROCEDURE:  Extraction of teeth numbers 18, 21, 22, 27, 28.  SURGEON:  Diona Browner, DDS  ANESTHESIA:  General nasal intubation, _____ attending.    DESCRIPTION OF PROCEDURE:  The patient was placed on the table in supine position.  The general anesthesia was administered intravenously and a nasal endotracheal tube was placed and secured.  The eyes were protected and the patient was draped for  surgery.  A timeout was performed.  The posterior pharynx was suctioned and a throat pack was placed, 2% lidocaine 1:100,000 epinephrine was infiltrated in the inferior alveolar block on the right and left side and in buccal infiltration around the teeth  to be removed in the mandible.  A 15 blade was used to make an incision around teeth numbers 18, 21 and 22, both buccally and lingually in the gingival sulcus.  The periosteum was reflected.  The teeth were elevated with a 301 elevator and removed from  the mouth with the dental forceps.  The sockets were curetted, irrigated, and closed with 3-0 chromic.  The bite block and sweetheart retractor were repositioned to the other side of the mouth.  A 15 blade was used to make an incision buccally and  lingually in the gingival sulcus around 27 and 28.  The periosteum was reflected with a periosteal elevator.  The teeth were elevated with a 301 elevator.  Attempt was made to remove the teeth with the Ash forceps but the crown fractured leaving roots of  27 and 28.  Then, the Stryker handpiece was used to remove the circumferential bone from around the  teeth under irrigation and then the teeth were elevated and removed with the dental 301 elevator.  The sockets were then curetted, irrigated and closed  with 3-0 chromic.  The oral cavity was irrigated and suctioned and the throat pack was removed.    The patient was left in care of Anesthesia for transport to recovery room with plans for discharge home through Day Surgery.  ESTIMATED BLOOD LOSS:  Minimal.  COMPLICATIONS:  None.  SPECIMENS:  None.  AN/NUANCE  D:10/26/2018 T:10/26/2018 JOB:[id number]

## 2018-10-26 NOTE — H&P (Signed)
H&P documentation  -History and Physical Reviewed  -Patient has been re-examined  -No change in the plan of care  [name]  

## 2018-10-26 NOTE — Anesthesia Procedure Notes (Signed)
Procedure Name: Intubation Date/Time: 10/26/2018 9:47 AM Performed by: Maryella Shivers, CRNA Pre-anesthesia Checklist: Patient identified, Emergency Drugs available, Suction available and Patient being monitored Patient Re-evaluated:Patient Re-evaluated prior to induction Oxygen Delivery Method: Circle system utilized Preoxygenation: Pre-oxygenation with 100% oxygen Induction Type: IV induction Ventilation: Mask ventilation without difficulty Laryngoscope Size: Mac and 3 Grade View: Grade I Nasal Tubes: Nasal prep performed and Nasal Rae Tube size: 6.0 mm Placement Confirmation: ETT inserted through vocal cords under direct vision,  positive ETCO2 and breath sounds checked- equal and bilateral Secured at: 22 cm Tube secured with: Tape Dental Injury: Teeth and Oropharynx as per pre-operative assessment

## 2018-10-26 NOTE — Anesthesia Preprocedure Evaluation (Addendum)
Anesthesia Evaluation  Patient identified by MRN, date of birth, ID band Patient awake    Reviewed: Allergy & Precautions, NPO status , Patient's Chart, lab work & pertinent test results  Airway Mallampati: II  TM Distance: >3 FB Neck ROM: Full    Dental  (+) Dental Advisory Given, Poor Dentition   Pulmonary Current Smoker,    Pulmonary exam normal breath sounds clear to auscultation       Cardiovascular Exercise Tolerance: Good negative cardio ROS Normal cardiovascular exam Rhythm:Regular Rate:Normal     Neuro/Psych PSYCHIATRIC DISORDERS Bipolar Disorder Left foot drop    GI/Hepatic negative GI ROS, (+)     substance abuse (H/o polysubstance abuse)  alcohol use,   Endo/Other  negative endocrine ROS  Renal/GU negative Renal ROS     Musculoskeletal negative musculoskeletal ROS (+)   Abdominal   Peds  Hematology negative hematology ROS (+)   Anesthesia Other Findings Day of surgery medications reviewed with the patient.  Reproductive/Obstetrics                            Anesthesia Physical Anesthesia Plan  ASA: III  Anesthesia Plan: General   Post-op Pain Management:    Induction: Intravenous  PONV Risk Score and Plan: 3 and Midazolam, Dexamethasone and Ondansetron  Airway Management Planned: Nasal ETT  Additional Equipment:   Intra-op Plan:   Post-operative Plan: Extubation in OR  Informed Consent: I have reviewed the patients History and Physical, chart, labs and discussed the procedure including the risks, benefits and alternatives for the proposed anesthesia with the patient or authorized representative who has indicated his/her understanding and acceptance.   Dental advisory given  Plan Discussed with: CRNA  Anesthesia Plan Comments:         Anesthesia Quick Evaluation

## 2018-10-26 NOTE — Op Note (Signed)
10/26/2018  10:03 AM  PATIENT:  [name]  47 y.o. female  PRE-OPERATIVE DIAGNOSIS:  NON-RESTORABLE TEETH  # 18, 21, 22, 27, 28  POST-OPERATIVE DIAGNOSIS:  SAME  PROCEDURE:  Procedure(s): MULTIPLE EXTRACTION # 18, 21, 22, 27, 28  SURGEON:  Surgeon(s): Diona Browner, DDS  ANESTHESIA:   local and general  EBL:  minimal  DRAINS: none   SPECIMEN:  No Specimen  COUNTS:  YES  PLAN OF CARE: Discharge to home after PACU  PATIENT DISPOSITION:  PACU - hemodynamically stable.   PROCEDURE DETAILS: Dictation # [id number] Gae Bon, DMD 10/26/2018 10:03 AM

## 2018-10-26 NOTE — Anesthesia Postprocedure Evaluation (Signed)
Anesthesia Post Note  Patient: [name]  Procedure(s) Performed: MULTIPLE EXTRACTION # 18, 21, 22, 27, 28 (Bilateral Mouth)     Patient location during evaluation: PACU Anesthesia Type: General Level of consciousness: awake and alert, awake and oriented Pain management: pain level controlled Vital Signs Assessment: post-procedure vital signs reviewed and stable Respiratory status: spontaneous breathing, nonlabored ventilation and respiratory function stable Cardiovascular status: blood pressure returned to baseline and stable Postop Assessment: no apparent nausea or vomiting Anesthetic complications: no    Last Vitals:  Vitals:   10/26/18 1200 10/26/18 1245  BP: (!) 98/56 104/62  Pulse: 66 71  Resp: 17 18  Temp:  36.5 C  SpO2: 95% 97%    Last Pain:  Vitals:   10/26/18 1245  TempSrc:   PainSc: 0-No pain                 Catalina Gravel

## 2018-10-26 NOTE — Discharge Instructions (Signed)

## 2018-10-26 NOTE — Progress Notes (Signed)
Attempted to update family but none in waiting room

## 2018-10-26 NOTE — Transfer of Care (Signed)
Immediate Anesthesia Transfer of Care Note  Patient: [name]  Procedure(s) Performed: MULTIPLE EXTRACTION # 18, 21, 22, 27, 28 (Bilateral Mouth)  Patient Location: PACU  Anesthesia Type:General  Level of Consciousness: sedated  Airway & Oxygen Therapy: Patient Spontanous Breathing and Patient connected to face mask oxygen  Post-op Assessment: Report given to RN and Post -op Vital signs reviewed and stable  Post vital signs: Reviewed and stable  Last Vitals:  Vitals Value Taken Time  BP    Temp    Pulse 78 10/26/2018 10:21 AM  Resp 16 10/26/2018 10:21 AM  SpO2 100 % 10/26/2018 10:21 AM  Vitals shown include unvalidated device data.  Last Pain:  Vitals:   10/26/18 0857  TempSrc: Oral  PainSc: 0-No pain         Complications: No apparent anesthesia complications

## 2018-10-27 ENCOUNTER — Encounter (HOSPITAL_BASED_OUTPATIENT_CLINIC_OR_DEPARTMENT_OTHER): Payer: Self-pay | Admitting: Oral Surgery

## 2018-12-01 ENCOUNTER — Telehealth: Payer: Self-pay | Admitting: *Deleted

## 2018-12-01 NOTE — Telephone Encounter (Signed)
Spoke to pt.  She is unsure of where to turn to now after seeing NS, Dr. Farley Ly at [facility], she had also seen ortho, they told her to come back in 68mo.  She had [place]/EMG with Dr. Tillman Abide (referred by NS).  NOted to have some sciatic nerve problem that may take time to heal.  I told her to make sure she understands what they have told her to do, the plan, if they have one for her.  (monitor, other therapy??).  If she continues with problem, we would be glad to see if we can do anything else.  She just needs to call us back.  She states that she is not taking gabapentin 100mg ,  Or lidocaine any longer.  She was not intoxicated, but tripped over her dog (she wanted [place] to know this and change in note).  She said Dr. Farley Ly, stated that it was not stenosis causing the L foot drop (FYI).

## 2018-12-01 NOTE — Telephone Encounter (Signed)
Patient stopped by the office to pick up some records.  Would like a call back to discuss plan of care at this point.

## 2018-12-26 ENCOUNTER — Other Ambulatory Visit: Payer: Self-pay | Admitting: Internal Medicine

## 2018-12-26 DIAGNOSIS — Z1231 Encounter for screening mammogram for malignant neoplasm of breast: Secondary | ICD-10-CM

## 2018-12-28 NOTE — Therapy (Signed)
[facility] [address] [place], [place], [address] Phone: [phone]   Fax:  [phone]  Patient Details  Name: [name] MRN: [id number] Date of Birth: Sep 18, 1971 Referring Provider:  No ref. provider found  Encounter Date: 12/28/2018  OCCUPATIONAL THERAPY DISCHARGE SUMMARY  Visits from Start of Care: 14  Current functional level related to goals / functional outcomes:   See last progress note on 10/03/18 for updates towards goals. Pt did not return for last visit to assess remaining goals.     Remaining deficits: Same as eval   Education / Equipment: HEP's, splint wear and care  Plan: Patient agrees to discharge.  Patient goals were partially met. Patient is being discharged due to not returning since the last visit.  Pt was going to be d/c at next scheduled visit but did not return to assess remaining goals?????        Carey Bullocks, OTR/L 12/28/2018, 10:42 AM  [facility] [address] [place], [place], [address] Phone: [phone]   Fax:  [phone]

## 2019-01-05 ENCOUNTER — Encounter (HOSPITAL_BASED_OUTPATIENT_CLINIC_OR_DEPARTMENT_OTHER): Payer: Medicaid Other | Attending: Internal Medicine

## 2019-01-30 ENCOUNTER — Ambulatory Visit: Payer: Medicaid Other

## 2019-02-17 ENCOUNTER — Encounter: Payer: Self-pay | Admitting: Physical Therapy

## 2019-02-17 NOTE — Therapy (Unsigned)
[facility] [address] [place], [place], [address] Phone: [phone]   Fax:  [phone]  Patient Details  Name: [name] MRN: [id number] Date of Birth: 03-13-1971 Referring Provider:  No ref. provider found  Encounter Date: 02/17/2019  PHYSICAL THERAPY DISCHARGE SUMMARY  Visits from Start of Care: 7  Current functional level related to goals / functional outcomes: Patient did not return for final visit and unable to assess   Remaining deficits: Patient did not return for final visit and unable to assess   Education / Equipment: Patient did not return for final visit and unable to assess  Plan: Patient agrees to discharge.  Patient goals were not met. Patient is being discharged due to not returning since the last visit.  ?????       Rexanne Mano , PT 02/17/2019, 1:27 PM  [facility] [address] [place], [place], [address] Phone: [phone]   Fax:  [phone]

## 2019-03-01 ENCOUNTER — Ambulatory Visit: Payer: Medicaid Other

## 2019-03-30 DIAGNOSIS — Z0271 Encounter for disability determination: Secondary | ICD-10-CM

## 2019-04-21 ENCOUNTER — Ambulatory Visit: Payer: Medicaid Other

## 2019-11-02 ENCOUNTER — Encounter: Payer: Self-pay | Admitting: Physical Therapy

## 2019-11-02 ENCOUNTER — Ambulatory Visit: Payer: Medicaid Other | Attending: Family Medicine | Admitting: Physical Therapy

## 2019-11-02 ENCOUNTER — Other Ambulatory Visit: Payer: Self-pay

## 2019-11-02 DIAGNOSIS — M6281 Muscle weakness (generalized): Secondary | ICD-10-CM

## 2019-11-02 DIAGNOSIS — R2689 Other abnormalities of gait and mobility: Secondary | ICD-10-CM | POA: Diagnosis not present

## 2019-11-02 DIAGNOSIS — R2681 Unsteadiness on feet: Secondary | ICD-10-CM

## 2019-11-02 NOTE — Therapy (Signed)
[facility] [address] [place], [place], [address] Phone: [phone]   Fax:  [phone]  Physical Therapy Evaluation  Patient Details  Name: [name] MRN: [id number] Date of Birth: May 07, 1971 Referring Provider (PT): Horald Pollen   Encounter Date: 11/02/2019  PT End of Session - 11/02/19 1339    Visit Number  1    Number of Visits  7    Date for PT Re-Evaluation  01/31/20    Authorization Type  Medicaid    PT Start Time  [id number]    PT Stop Time  1316    PT Time Calculation (min)  45 min    Activity Tolerance  Patient tolerated treatment well    Behavior During Therapy  [facility] for tasks assessed/performed       Past Medical History:  Diagnosis Date  . Bipolar 1 disorder ([place])    followed by psychiatry  . Chronic kidney disease    hx of short term dialysis  . 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 ETOH abuse   . Foot drop, left   . Foot drop, left foot   . Polysubstance abuse ([place])    history of  . Smoker   . Swallowing difficulty    cannot swallow pills  . Teeth decayed   . Vitamin D deficiency     Past Surgical History:  Procedure Laterality Date  . arm surgery Left    compartment syndrome surgeries  . CESAREAN SECTION     fetal distress  . HAND SURGERY     for fracture  . TONSILLECTOMY    . TOOTH EXTRACTION Bilateral 10/26/2018   Procedure: MULTIPLE EXTRACTION # 18, 21, 22, 27, 28;  Surgeon: Diona Browner, DDS;  Location: [facility];  Service: Oral Surgery;  Laterality: Bilateral;    There were no vitals filed for this visit.   Subjective Assessment - 11/02/19 1243    Subjective  Fell down the stairs last July (2019), and was knocked unconscious out on the floor for several days.  Had rhabdomyolysis and kidney failure.  Had foot frop on L foot.  I have since had nerve conduction test, and feel like now I can move toes in my L foot.  Never really have been able to  use the AFO.  I haven't had so much nerve pain in my leg, but feels like a "low buzz" in my leg.  Not having as many falls.  Still have to be conscious of my L foot, but it's way better.  Not using device any more.    Pertinent History  PMH include bipolar, cellulits BLEs, rhabdomyolysis, ETOH and polysubstance abuse, L arm surgery due to compartment syndrome    Patient Stated Goals  Pt's goal for PT is to get all the function back in my L foot.    Currently in Pain?  No/denies         [facility] PT Assessment - 11/02/19 1248      Assessment   Medical Diagnosis  L foot drop    Referring Provider (PT)  Horald Pollen    Onset Date/Surgical Date  10/31/19   MD order   Hand Dominance  Right      Precautions   Precautions  Fall      Balance Screen   Has the patient fallen in the past 6 months  No    Has the patient had a decrease in activity level because of  a fear of falling?   No    Is the patient reluctant to leave their home because of a fear of falling?   No      Home Film/video editor residence    Living Arrangements  Children   Lives with son (37 yo)   Available Help at Discharge  Family    Type of [place] to enter    Entrance Stairs-Number of Steps  1   additional 1 step; no rails   Home Layout  Two level    Alternate Level Stairs-Number of Steps  13    Alternate Level Stairs-Rails  Right;Left;Can reach both    [place] - 2 wheels;Wheelchair - manual    Additional Comments  Has exercise bike at home      Prior Function   Level of Independence  Independent      Observation/Other Assessments   Focus on Therapeutic Outcomes (FOTO)   NA      Sensation   Light Touch  Impaired by gross assessment    Additional Comments  Decreased sensation to light touch LLE      ROM / Strength   AROM / PROM / Strength  Strength;PROM      PROM   Overall PROM Comments  L ankle dorsiflexion 5 degrees beyond  neutral      Strength   Overall Strength  Deficits    Strength Assessment Site  Hip;Knee;Ankle    Right/Left Hip  Right;Left    Right Hip Flexion  5/5    Left Hip Flexion  5/5    Right/Left Knee  Right;Left    Right Knee Flexion  5/5    Right Knee Extension  5/5    Left Knee Flexion  5/5    Left Knee Extension  5/5    Right/Left Ankle  Right;Left    Right Ankle Dorsiflexion  4/5    Left Ankle Dorsiflexion  1/5    Left Ankle Plantar Flexion  3-/5    Left Ankle Eversion  1/5      Transfers   Transfers  Sit to Stand;Stand to Sit    Sit to Stand  6: Modified independent (Device/Increase time);Without upper extremity assist;From chair/3-in-1    Five time sit to stand comments   8.28   LLE positioned more anterior to RLE   Stand to Sit  6: Modified independent (Device/Increase time);Without upper extremity assist;To chair/3-in-1      Ambulation/Gait   Ambulation/Gait  Yes    Ambulation/Gait Assistance  5: Supervision    Ambulation/Gait Assistance Details  Pt not wearing AFO    Ambulation Distance (Feet)  120 Feet    Assistive device  None    Gait Pattern  Step-through pattern;Decreased arm swing - left;Decreased step length - left;Decreased dorsiflexion - left;Decreased weight shift to left;Decreased trunk rotation;Poor foot clearance - left;Left steppage   L foot slap   Ambulation Surface  Level;Indoor    Gait velocity  10.65 sec = 3.08 ft/sec      Standardized Balance Assessment   Standardized Balance Assessment  Timed Up and Go Test;Dynamic Gait Index      Dynamic Gait Index   Level Surface  Mild Impairment    Change in Gait Speed  Mild Impairment    Gait with Horizontal Head Turns  Mild Impairment    Gait with Vertical Head Turns  Mild Impairment  Gait and Pivot Turn  Normal    Step Over Obstacle  Mild Impairment    Step Around Obstacles  Mild Impairment    Steps  Moderate Impairment   Step to up wtih RLE leading, down RLE leading   Total Score  16    DGI comment:   Scores <19/24 indicate increased fall risk.      Timed Up and Go Test   Normal TUG (seconds)  11.13    TUG Comments  Scores >13.5 sec indicate increased fall risk.      High Level Balance   High Level Balance Comments  Single limb stance:  10 sec RLE, 0.64 sec, 0.81 sec LLE; Tandem stance:  RLE in posterior position 30 seconds, LLE in posterior position 5.81 sec                Objective measurements completed on examination: See above findings.                PT Short Term Goals - 11/02/19 1349      PT SHORT TERM GOAL #1   Title  Patient will be independent with basic HEP for LLE strengthening, stretching, and balance/gait.  (Target for all STGs 11/23/2019)    Baseline  not currently doing HEP previously issude > 1 yr ago    Time  2    Period  Weeks    Status  New      PT SHORT TERM GOAL #2   Title  Pt will perform 5x sit<>stand wit BLEs in equal weightbearing position, in less than 9 seconds, to demonstrate improved functional strength and use of LLE.    Baseline  5x sit<>stand RLE positioned in posterior position 8.28 sec    Time  2    Period  Weeks    Status  New      PT SHORT TERM GOAL #3   Title  Pt will improve single limb stance to at least 2 seconds on LLE for improved functional LLE strength.    Baseline  0.64 sec and 0.81 sec LLE stance    Time  2    Period  Weeks    Status  New        PT Long Term Goals - 11/02/19 1353      PT LONG TERM GOAL #1   Title  Patient will be independent with progression of HEP (Target all LTGs by 6th visit following eval--12/22/2019)    Baseline  Not currently doing HEP at eval    Time  6    Period  Weeks    Status  New      PT LONG TERM GOAL #2   Title  Pt will improve DGI score to at least 19/24 for decreased fall risk.    Baseline  DGI 16/24; scores <19/24 indicate increased fall risk.    Time  6    Period  Weeks    Status  New      PT LONG TERM GOAL #3   Title  Pt will improve tandem stance with  LLE in posterior position to at least 15 seconds demonstrating improved LLE strength.    Baseline  tandem stance with LLE in posterior position 5.81 sec    Time  6    Period  Weeks    Status  New      PT LONG TERM GOAL #4   Title  Pt will negotiate at least 4 steps with bilateral handrails, step through  pattern, modified independently.    Baseline  step to pattern with bilateral rails, RLE leading up and RLE leading down    Time  6    Period  Weeks    Status  New             Plan - 11/02/19 1340    Clinical Impression Statement  Pt is a 48 year old female who presents to [facility] with hx of foot drop, following fall in July 2019.  She reports previously not having any movement in L foot, but recently, able to move L toes and slightly move L foot.  Pt demonstrates decreased strength and flexibility in L foot and ankle, decreased balance, decreased sensation, decreased timing and coordination of gait.  She would beneift from skilled PT to address the above stated deficits for decreased fall risk, improved overall functional mobility and independence.    Personal Factors and Comorbidities  Comorbidity 3+;Behavior Pattern    Comorbidities  HX of bipolar, cellulits BLEs, rhabdomyolysis, ETOH and polysubstance abuse, L arm surgery due to compartment syndrome    Examination-Activity Limitations  Locomotion Level;Squat;Stairs;Stand    Examination-Participation Restrictions  Community Activity    Stability/Clinical Decision Making  Evolving/Moderate complexity    Clinical Decision Making  Moderate    Rehab Potential  Good    PT Frequency  Other (comment)   3 visits   PT Duration  Other (comment)   2 weeks plus eval, then 1x/wk for 3 weeks   PT Treatment/Interventions  ADLs/Self Care Home Management;Gait training;DME Instruction;Electrical Stimulation;Stair training;Functional mobility training;Therapeutic activities;Therapeutic exercise;Balance training;Neuromuscular re-education;Patient/family  education;Orthotic Fit/Training    PT Next Visit Plan  Initiate HEP, stair and gait training; LLE stretching, squats, sit<>stand (LLE in posterior position), LLE strengthening.  In January, will need to discuss wit pt/OT about initial visits for 2021    Consulted and Agree with Plan of Care  Patient       Patient will benefit from skilled therapeutic intervention in order to improve the following deficits and impairments:  Abnormal gait, Decreased balance, Decreased mobility, Difficulty walking, Decreased range of motion, Decreased strength, Impaired sensation, Impaired flexibility  Visit Diagnosis: Other abnormalities of gait and mobility  Muscle weakness (generalized)  Unsteadiness on feet     Problem List Patient Active Problem List   Diagnosis Date Noted  . Smoker 05/06/2016  . Bipolar disorder ([place]) 05/06/2016    MARRIOTT,AMY W. 11/02/2019, 1:59 PM Frazier Butt., PT  [facility] [address] [place], [place], [address] Phone: [phone]   Fax:  [phone]  Name: [name] MRN: [id number] Date of Birth: Aug 28, 1971

## 2019-11-13 ENCOUNTER — Other Ambulatory Visit: Payer: Self-pay

## 2019-11-13 ENCOUNTER — Ambulatory Visit: Payer: Medicaid Other | Admitting: Physical Therapy

## 2019-11-13 DIAGNOSIS — M6281 Muscle weakness (generalized): Secondary | ICD-10-CM

## 2019-11-13 DIAGNOSIS — R2689 Other abnormalities of gait and mobility: Secondary | ICD-10-CM | POA: Diagnosis not present

## 2019-11-13 NOTE — Patient Instructions (Signed)
Access Code: [email or web address]  URL: [email or web address]  Date: 11/13/2019  Prepared by: Mady Haagensen   Exercises Sit to Stand in Stride with AFO and UE Assist in LE Alignment - 10 reps - 2 sets - 1x daily - 5x weekly Standing Gastroc Stretch at Counter - 3 reps - 1 sets - 15-30 sec hold - 2-3x daily - 7x weekly Seated Gastroc Stretch with Strap - 3 reps - 1 sets - 15-30 sec hold - 1-2x daily - 7x weekly Single Leg Stance with Support - 3 reps - 1 sets - 10 sec hold - 1x daily - 5x weekly

## 2019-11-14 ENCOUNTER — Encounter: Payer: Self-pay | Admitting: Physical Therapy

## 2019-11-14 ENCOUNTER — Ambulatory Visit: Payer: Medicaid Other | Admitting: Physical Therapy

## 2019-11-14 DIAGNOSIS — R2689 Other abnormalities of gait and mobility: Secondary | ICD-10-CM

## 2019-11-14 DIAGNOSIS — M6281 Muscle weakness (generalized): Secondary | ICD-10-CM

## 2019-11-14 DIAGNOSIS — R2681 Unsteadiness on feet: Secondary | ICD-10-CM

## 2019-11-14 NOTE — Therapy (Signed)
[facility] [address] [place], [place], [address] Phone: [phone]   Fax:  [phone]  Physical Therapy Treatment  Patient Details  Name: [name] MRN: [id number] Date of Birth: Mar 15, 1971 Referring Provider (PT): Horald Pollen   Encounter Date: 11/14/2019  PT End of Session - 11/14/19 1336    Visit Number  3    Number of Visits  7    Date for PT Re-Evaluation  01/31/20    Authorization Type  Medicaid    Authorization - Visit Number  2    Authorization - Number of Visits  3    PT Start Time  [id number]    PT Stop Time  1316    PT Time Calculation (min)  43 min    Activity Tolerance  Patient tolerated treatment well    Behavior During Therapy  [facility] for tasks assessed/performed       Past Medical History:  Diagnosis Date  . Bipolar 1 disorder ([place])    followed by psychiatry  . Chronic kidney disease    hx of short term dialysis  . 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 ETOH abuse   . Foot drop, left   . Foot drop, left foot   . Polysubstance abuse ([place])    history of  . Smoker   . Swallowing difficulty    cannot swallow pills  . Teeth decayed   . Vitamin D deficiency     Past Surgical History:  Procedure Laterality Date  . arm surgery Left    compartment syndrome surgeries  . CESAREAN SECTION     fetal distress  . HAND SURGERY     for fracture  . TONSILLECTOMY    . TOOTH EXTRACTION Bilateral 10/26/2018   Procedure: MULTIPLE EXTRACTION # 18, 21, 22, 27, 28;  Surgeon: Diona Browner, DDS;  Location: [facility];  Service: Oral Surgery;  Laterality: Bilateral;    There were no vitals filed for this visit.  Subjective Assessment - 11/14/19 1236    Subjective  Denies any falls or changes.  States that her 5th toe on L foot is crossing over to the midline.    Pertinent History  PMH include bipolar, cellulits BLEs, rhabdomyolysis, ETOH and polysubstance abuse, L arm surgery  due to compartment syndrome    Patient Stated Goals  Pt's goal for PT is to get all the function back in my L foot.    Currently in Pain?  No/denies         [facility] Adult PT Treatment/Exercise - 11/14/19 1328      Transfers   Transfers  Sit to Stand;Stand to Sit    Sit to Stand  6: Modified independent (Device/Increase time);Without upper extremity assist;From chair/3-in-1    Stand to Sit  6: Modified independent (Device/Increase time);Without upper extremity assist;To chair/3-in-1      Ambulation/Gait   Ambulation/Gait  Yes    Ambulation/Gait Assistance  5: Supervision    Ambulation Distance (Feet)  --   into/out of gym and between activities   Assistive device  None    Gait Pattern  Step-through pattern;Decreased arm swing - left;Decreased step length - left;Decreased dorsiflexion - left;Decreased weight shift to left;Decreased trunk rotation;Poor foot clearance - left;Left steppage    Stairs  Yes    Stairs Assistance  5: Supervision    Stair Management Technique  Two rails;Alternating pattern;Forwards    Number of Stairs  4   x 3  Height of Stairs  6      Exercises   Exercises  Ankle;Knee/Hip      Knee/Hip Exercises: Stretches   Gastroc Stretch  Left;3 reps;20 seconds    Gastroc Stretch Limitations  Using belt in sitting    Other Knee/Hip Stretches  Attempted gastroc in standing:  L foot propped at 4" step, with discomfort reported in plantar aspect of foot.  Switched to runner's stretch position, 2 reps x 30 seconds at counter.      Knee/Hip Exercises: Aerobic   Nustep  NuStep, Level 4, 4 extremities, x 10 minutes, cues to keep steps/minute over 50 for improved strength and flexibility.      Knee/Hip Exercises: Standing   Heel Raises  Both;1 set;10 reps    Knee Flexion  Both;10 reps;Other (comment);Strengthening   with red theraband   Hip Flexion  Both;Stengthening;15 reps;Knee bent;Other (comment)   with red theraband   Forward Lunges  --    Side Lunges  --    Hip  Abduction  Stengthening;Both;10 reps;Knee straight;Other (comment)   red theraband   Hip Extension  Stengthening;Both;10 reps;Knee straight;Other (comment);Right   red theraband;difficulty with L foot clearance   Forward Step Up  10 reps;Hand Hold: 1;Step Height: 6";Left    Step Down  Left;10 reps;Hand Hold: 2;Step Height: 6"    SLS  x 3 reps each side x 10 sec with UE support    Other Standing Knee Exercises  side stepping with squat and red theraband at counter with bil UE support x 8' x 6 reps      Knee/Hip Exercises: Seated   Sit to Sand  3 sets;5 reps;without UE support      Ankle Exercises: Stretches   Gastroc Stretch  2 reps;30 seconds      Ankle Exercises: Seated   Other Seated Ankle Exercises  sit<>stand in staggered stride with LLE closer x 10 reps for LLE stretching/WB             PT Education - 11/14/19 1335    Education Details  Additions to [employer]) Educated  Patient    Methods  Explanation;Demonstration;Handout    Comprehension  Verbalized understanding       PT Short Term Goals - 11/02/19 1349      PT SHORT TERM GOAL #1   Title  Patient will be independent with basic HEP for LLE strengthening, stretching, and balance/gait.  (Target for all STGs 11/23/2019)    Baseline  not currently doing HEP previously issude > 1 yr ago    Time  2    Period  Weeks    Status  New      PT SHORT TERM GOAL #2   Title  Pt will perform 5x sit<>stand wit BLEs in equal weightbearing position, in less than 9 seconds, to demonstrate improved functional strength and use of LLE.    Baseline  5x sit<>stand RLE positioned in posterior position 8.28 sec    Time  2    Period  Weeks    Status  New      PT SHORT TERM GOAL #3   Title  Pt will improve single limb stance to at least 2 seconds on LLE for improved functional LLE strength.    Baseline  0.64 sec and 0.81 sec LLE stance    Time  2    Period  Weeks    Status  New        PT Long Term Goals -  11/02/19 1353       PT LONG TERM GOAL #1   Title  Patient will be independent with progression of HEP (Target all LTGs by 6th visit following eval--12/22/2019)    Baseline  Not currently doing HEP at eval    Time  6    Period  Weeks    Status  New      PT LONG TERM GOAL #2   Title  Pt will improve DGI score to at least 19/24 for decreased fall risk.    Baseline  DGI 16/24; scores <19/24 indicate increased fall risk.    Time  6    Period  Weeks    Status  New      PT LONG TERM GOAL #3   Title  Pt will improve tandem stance with LLE in posterior position to at least 15 seconds demonstrating improved LLE strength.    Baseline  tandem stance with LLE in posterior position 5.81 sec    Time  6    Period  Weeks    Status  New      PT LONG TERM GOAL #4   Title  Pt will negotiate at least 4 steps with bilateral handrails, step through pattern, modified independently.    Baseline  step to pattern with bilateral rails, RLE leading up and RLE leading down    Time  6    Period  Weeks    Status  New            Plan - 11/14/19 1336    Clinical Impression Statement  Skilled session focused on reviewing HEP and adding additional exercises for strengthening.  Pt appears motivated to increase mobility and strength.  Continue PT per POC.    Personal Factors and Comorbidities  Comorbidity 3+;Behavior Pattern    Comorbidities  HX of bipolar, cellulits BLEs, rhabdomyolysis, ETOH and polysubstance abuse, L arm surgery due to compartment syndrome    Examination-Activity Limitations  Locomotion Level;Squat;Stairs;Stand    Examination-Participation Restrictions  Community Activity    Stability/Clinical Decision Making  Evolving/Moderate complexity    Rehab Potential  Good    PT Frequency  Other (comment)   3 visits   PT Duration  Other (comment)   2 weeks plus eval, then 1x/wk for 3 weeks   PT Treatment/Interventions  ADLs/Self Care Home Management;Gait training;DME Instruction;Electrical Stimulation;Stair  training;Functional mobility training;Therapeutic activities;Therapeutic exercise;Balance training;Neuromuscular re-education;Patient/family education;Orthotic Fit/Training    PT Next Visit Plan  continue LLE stregnthening, SLS and stair negotitation.  Need to get patient scheduled in January for at least 3 visits (so I can request reauth for Medicaid)    Consulted and Agree with Plan of Care  Patient       Patient will benefit from skilled therapeutic intervention in order to improve the following deficits and impairments:  Abnormal gait, Decreased balance, Decreased mobility, Difficulty walking, Decreased range of motion, Decreased strength, Impaired sensation, Impaired flexibility  Visit Diagnosis: Muscle weakness (generalized)  Other abnormalities of gait and mobility  Unsteadiness on feet     Problem List Patient Active Problem List   Diagnosis Date Noted  . Smoker 05/06/2016  . Bipolar disorder ([place]) 05/06/2016   Narda Bonds, PTA [facility] 11/14/19 1:38 PM Phone: [phone] Fax: [facility] [address] [place], [place], [address] Phone: [phone]   Fax:  [phone]  Name: MILDRID MYRICK MRN: [id number] Date of Birth: 04/24/1971

## 2019-11-14 NOTE — Patient Instructions (Signed)
Access Code: [email or web address]  URL: [email or web address]  Date: 11/14/2019  Prepared by: Nita Sells   Exercises Sit to Stand in Stride with AFO and UE Assist in LE Alignment - 10 reps - 2 sets - 1x daily - 5x weekly Standing Gastroc Stretch at Counter - 3 reps - 1 sets - 15-30 sec hold - 2-3x daily - 7x weekly Seated Gastroc Stretch with Strap - 3 reps - 1 sets - 15-30 sec hold - 1-2x daily - 7x weekly Single Leg Stance with Support - 3 reps - 1 sets - 10 sec hold - 1x daily - 5x weekly Standing Hip Abduction with Resistance at Ankles - 10 reps - 2 sets - 1x daily - 5x weekly Standing [place] with Counter Support - 10 reps - 1x daily - 5x weekly Side Stepping with Resistance at Ankles - 10 reps - 2 sets - 1x daily - 5x weekly Hip Extension with Resistance Loop - 10 reps - 1 sets - 2x daily - 5x weekly

## 2019-11-14 NOTE — Therapy (Signed)
[facility] [address] [place], [place], [address] Phone: [phone]   Fax:  [phone]  Physical Therapy Treatment  Patient Details  Name: [name] MRN: [id number] Date of Birth: 30-May-1971 Referring Provider (PT): Horald Pollen   Encounter Date: 11/13/2019  PT End of Session - 11/14/19 0754    Visit Number  2    Number of Visits  7    Date for PT Re-Evaluation  01/31/20    Authorization Type  Medicaid    PT Start Time  0934    PT Stop Time  1014    PT Time Calculation (min)  40 min    Activity Tolerance  Patient tolerated treatment well    Behavior During Therapy  [facility] for tasks assessed/performed       Past Medical History:  Diagnosis Date  . Bipolar 1 disorder ([place])    followed by psychiatry  . Chronic kidney disease    hx of short term dialysis  . 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 ETOH abuse   . Foot drop, left   . Foot drop, left foot   . Polysubstance abuse ([place])    history of  . Smoker   . Swallowing difficulty    cannot swallow pills  . Teeth decayed   . Vitamin D deficiency     Past Surgical History:  Procedure Laterality Date  . arm surgery Left    compartment syndrome surgeries  . CESAREAN SECTION     fetal distress  . HAND SURGERY     for fracture  . TONSILLECTOMY    . TOOTH EXTRACTION Bilateral 10/26/2018   Procedure: MULTIPLE EXTRACTION # 18, 21, 22, 27, 28;  Surgeon: Diona Browner, DDS;  Location: [facility];  Service: Oral Surgery;  Laterality: Bilateral;    There were no vitals filed for this visit.  Subjective Assessment - 11/13/19 0937    Subjective  No changes, no falls since eval visit.  Sometimes my L pinky toe is crossing over my 4th toe-doesn't matter what type of shoe I wear.    Pertinent History  PMH include bipolar, cellulits BLEs, rhabdomyolysis, ETOH and polysubstance abuse, L arm surgery due to compartment syndrome    Patient Stated Goals  Pt's goal for PT is to get all the function back in my L foot.    Currently in Pain?  No/denies                       [facility] Adult PT Treatment/Exercise - 11/14/19 0759      Ambulation/Gait   Ambulation/Gait  Yes    Ambulation/Gait Assistance  5: Supervision    Ambulation/Gait Assistance Details  Pt not wearing AFO    Ambulation Distance (Feet)  120 Feet    Assistive device  None    Gait Pattern  Step-through pattern;Decreased arm swing - left;Decreased step length - left;Decreased dorsiflexion - left;Decreased weight shift to left;Decreased trunk rotation;Poor foot clearance - left;Left steppage    Ambulation Surface  Level;Indoor    Stairs  Yes    Stairs Assistance  5: Supervision    Stairs Assistance Details (indicate cue type and reason)  Performed once with pt's preference of step-to pattern, then x 2 with step through pattern with cues    Stair Management Technique  Two rails;Alternating pattern;Forwards    Number of Stairs  4   x 3   Height of Stairs  6      Exercises   Exercises  Ankle;Knee/Hip      Knee/Hip Exercises: Stretches   Gastroc Stretch  Left;3 reps;20 seconds    Gastroc Stretch Limitations  Using belt in sitting    Other Knee/Hip Stretches  Attempted gastroc in standing:  L foot propped at 4" step, with discomfort reported in plantar aspect of foot.  Switched to runner's stretch position, 2 reps x 30 seconds at counter.      Knee/Hip Exercises: Aerobic   Nustep  NuStep, Level 3, 4 extremities, x 8 minutes, cues to keep steps/minute over 50 for improved strength and flexibility.      Knee/Hip Exercises: Standing   Heel Raises  Both;1 set;10 reps    Forward Lunges  Right;Left;1 set;10 reps    Side Lunges  Right;Left;1 set;10 reps      Knee/Hip Exercises: Seated   Sit to Sand  3 sets;5 reps;without UE support          Balance Exercises - 11/13/19 0949      Balance Exercises: Standing   Tandem Stance  Eyes open;Upper  extremity support 1;3 reps;10 secs    SLS  Eyes open;Upper extremity support 1;3 reps;10 secs   LLE as stance   Sidestepping  3 reps;Upper extremity support   Along counter, cues for foot clearance   Other Standing Exercises  Alternating step taps to 6" step, x 10 reps, then LLE as stance with RLE step taps consecutive, x 10 reps         PT Education - 11/14/19 0754    Education Details  Additions to HEP    Person(s) Educated  Patient    Methods  Explanation;Demonstration;Handout    Comprehension  Verbalized understanding;Returned demonstration       PT Short Term Goals - 11/02/19 1349      PT SHORT TERM GOAL #1   Title  Patient will be independent with basic HEP for LLE strengthening, stretching, and balance/gait.  (Target for all STGs 11/23/2019)    Baseline  not currently doing HEP previously issude > 1 yr ago    Time  2    Period  Weeks    Status  New      PT SHORT TERM GOAL #2   Title  Pt will perform 5x sit<>stand wit BLEs in equal weightbearing position, in less than 9 seconds, to demonstrate improved functional strength and use of LLE.    Baseline  5x sit<>stand RLE positioned in posterior position 8.28 sec    Time  2    Period  Weeks    Status  New      PT SHORT TERM GOAL #3   Title  Pt will improve single limb stance to at least 2 seconds on LLE for improved functional LLE strength.    Baseline  0.64 sec and 0.81 sec LLE stance    Time  2    Period  Weeks    Status  New        PT Long Term Goals - 11/02/19 1353      PT LONG TERM GOAL #1   Title  Patient will be independent with progression of HEP (Target all LTGs by 6th visit following eval--12/22/2019)    Baseline  Not currently doing HEP at eval    Time  6    Period  Weeks    Status  New      PT LONG TERM GOAL #2   Title  Pt  will improve DGI score to at least 19/24 for decreased fall risk.    Baseline  DGI 16/24; scores <19/24 indicate increased fall risk.    Time  6    Period  Weeks    Status  New       PT LONG TERM GOAL #3   Title  Pt will improve tandem stance with LLE in posterior position to at least 15 seconds demonstrating improved LLE strength.    Baseline  tandem stance with LLE in posterior position 5.81 sec    Time  6    Period  Weeks    Status  New      PT LONG TERM GOAL #4   Title  Pt will negotiate at least 4 steps with bilateral handrails, step through pattern, modified independently.    Baseline  step to pattern with bilateral rails, RLE leading up and RLE leading down    Time  6    Period  Weeks    Status  New            Plan - 11/14/19 0755    Clinical Impression Statement  Initiated HEP this visit to address stretching, strengthening and single limb stance.  Pt requires cues for correct technique with exercises.  Pt will continue to benefit from skilled PT to address further stregnthening, balance and gait training.    Personal Factors and Comorbidities  Comorbidity 3+;Behavior Pattern    Comorbidities  HX of bipolar, cellulits BLEs, rhabdomyolysis, ETOH and polysubstance abuse, L arm surgery due to compartment syndrome    Examination-Activity Limitations  Locomotion Level;Squat;Stairs;Stand    Examination-Participation Restrictions  Community Activity    Stability/Clinical Decision Making  Evolving/Moderate complexity    Rehab Potential  Good    PT Frequency  Other (comment)   3 visits   PT Duration  Other (comment)   2 weeks plus eval, then 1x/wk for 3 weeks   PT Treatment/Interventions  ADLs/Self Care Home Management;Gait training;DME Instruction;Electrical Stimulation;Stair training;Functional mobility training;Therapeutic activities;Therapeutic exercise;Balance training;Neuromuscular re-education;Patient/family education;Orthotic Fit/Training    PT Next Visit Plan  Review HEP initiated 11/13/2019 visit, continue LLE stregnthening, SLS and stair negotitation.  Need to get patient scheduled in January for at least 3 visits (so I can request reauth for  Medicaid)    Consulted and Agree with Plan of Care  Patient       Patient will benefit from skilled therapeutic intervention in order to improve the following deficits and impairments:  Abnormal gait, Decreased balance, Decreased mobility, Difficulty walking, Decreased range of motion, Decreased strength, Impaired sensation, Impaired flexibility  Visit Diagnosis: Muscle weakness (generalized)  Other abnormalities of gait and mobility     Problem List Patient Active Problem List   Diagnosis Date Noted  . Smoker 05/06/2016  . Bipolar disorder ([place]) 05/06/2016    Marin Wisner W. 11/14/2019, 8:01 AM Frazier Butt., PT  [facility] [address] [place], [place], [address] Phone: [phone]   Fax:  [phone]  Name: [name] MRN: [id number] Date of Birth: Sep 18, 1971

## 2019-11-15 ENCOUNTER — Encounter: Payer: Self-pay | Admitting: Physical Therapy

## 2019-11-15 ENCOUNTER — Other Ambulatory Visit: Payer: Self-pay

## 2019-11-15 ENCOUNTER — Ambulatory Visit: Payer: Medicaid Other | Admitting: Physical Therapy

## 2019-11-15 VITALS — BP 110/72 | HR 93

## 2019-11-15 DIAGNOSIS — R2689 Other abnormalities of gait and mobility: Secondary | ICD-10-CM

## 2019-11-15 DIAGNOSIS — R2681 Unsteadiness on feet: Secondary | ICD-10-CM

## 2019-11-15 DIAGNOSIS — M6281 Muscle weakness (generalized): Secondary | ICD-10-CM

## 2019-11-15 NOTE — Therapy (Signed)
[facility] [address] [place], [place], [address] Phone: [phone]   Fax:  [phone]  Physical Therapy Treatment  Patient Details  Name: [name] MRN: [id number] Date of Birth: [birth year] Referring Provider (PT): Horald Pollen   Encounter Date: 11/15/2019  PT End of Session - 11/15/19 1036    Visit Number  4    Number of Visits  7    Date for PT Re-Evaluation  01/31/20    Authorization Type  Medicaid    Authorization - Visit Number  3    Authorization - Number of Visits  3    PT Start Time  0848    PT Stop Time  0929    PT Time Calculation (min)  41 min    Activity Tolerance  Patient tolerated treatment well    Behavior During Therapy  [facility] for tasks assessed/performed       Past Medical History:  Diagnosis Date  . Bipolar 1 disorder ([place])    followed by psychiatry  . Chronic kidney disease    hx of short term dialysis  . 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 ETOH abuse   . Foot drop, left   . Foot drop, left foot   . Polysubstance abuse ([place])    history of  . Smoker   . Swallowing difficulty    cannot swallow pills  . Teeth decayed   . Vitamin D deficiency     Past Surgical History:  Procedure Laterality Date  . arm surgery Left    compartment syndrome surgeries  . CESAREAN SECTION     fetal distress  . HAND SURGERY     for fracture  . TONSILLECTOMY    . TOOTH EXTRACTION Bilateral 10/26/2018   Procedure: MULTIPLE EXTRACTION # 18, 21, 22, 27, 28;  Surgeon: Diona Browner, DDS;  Location: [facility];  Service: Oral Surgery;  Laterality: Bilateral;    Vitals:   11/15/19 0921  BP: 110/72  Pulse: 93    Subjective Assessment - 11/15/19 0851    Subjective  Denies any falls or changes.  Went to dentist yesterday for a filling.    Pertinent History  PMH include bipolar, cellulits BLEs, rhabdomyolysis, ETOH and polysubstance abuse, L arm surgery due to  compartment syndrome    Patient Stated Goals  Pt's goal for PT is to get all the function back in my L foot.    Currently in Pain?  No/denies          In // bars for standing on balance beam with eyes open then eyes closed for static standing, head turns, head nods.  Progressed to [employer] both directions with same activities  On BOSU both surfaces for static standing progressing to head turns nods.  Performed squats 10 reps x 3 on black surface of BOSU in initial 1 UE then no UE support. Taps to 8" step alternating LE's x 15 then stepping onto/off 8" step leading with RLE x 15 reps then LLE x 15 reps with intermittent UE support. Stepping onto 8" step with RLE then tapping LLE up to seat of chair infront then reversing.  Repeated with leading with LLE.  Intermittent UE support and 15 reps each side.   Repeated again x 5 reps each side with pt starting on foam mat. NuStep workload 5 all 4 extremities x 7 minutes.  Seat at setting 5 and arms at 7.    PT Short  Term Goals - 11/02/19 1349      PT SHORT TERM GOAL #1   Title  Patient will be independent with basic HEP for LLE strengthening, stretching, and balance/gait.  (Target for all STGs 11/23/2019)    Baseline  not currently doing HEP previously issude > 1 yr ago    Time  2    Period  Weeks    Status  New      PT SHORT TERM GOAL #2   Title  Pt will perform 5x sit<>stand wit BLEs in equal weightbearing position, in less than 9 seconds, to demonstrate improved functional strength and use of LLE.    Baseline  5x sit<>stand RLE positioned in posterior position 8.28 sec    Time  2    Period  Weeks    Status  New      PT SHORT TERM GOAL #3   Title  Pt will improve single limb stance to at least 2 seconds on LLE for improved functional LLE strength.    Baseline  0.64 sec and 0.81 sec LLE stance    Time  2    Period  Weeks    Status  New        PT Long Term Goals - 11/02/19 1353      PT LONG TERM GOAL #1   Title  Patient will  be independent with progression of HEP (Target all LTGs by 6th visit following eval--12/22/2019)    Baseline  Not currently doing HEP at eval    Time  6    Period  Weeks    Status  New      PT LONG TERM GOAL #2   Title  Pt will improve DGI score to at least 19/24 for decreased fall risk.    Baseline  DGI 16/24; scores <19/24 indicate increased fall risk.    Time  6    Period  Weeks    Status  New      PT LONG TERM GOAL #3   Title  Pt will improve tandem stance with LLE in posterior position to at least 15 seconds demonstrating improved LLE strength.    Baseline  tandem stance with LLE in posterior position 5.81 sec    Time  6    Period  Weeks    Status  New      PT LONG TERM GOAL #4   Title  Pt will negotiate at least 4 steps with bilateral handrails, step through pattern, modified independently.    Baseline  step to pattern with bilateral rails, RLE leading up and RLE leading down    Time  6    Period  Weeks    Status  New            Plan - 11/15/19 1037    Clinical Impression Statement  Skilled session focused on balance and strengthening.  Pt continues to have difficulty with SLS on the LLE impacting balance.  Mady Haagensen, PT, has sent info to Medicaid to request additional PT visits for pt.  Awaiting Medicaid approval.    Personal Factors and Comorbidities  Comorbidity 3+;Behavior Pattern    Comorbidities  HX of bipolar, cellulits BLEs, rhabdomyolysis, ETOH and polysubstance abuse, L arm surgery due to compartment syndrome    Examination-Activity Limitations  Locomotion Level;Squat;Stairs;Stand    Examination-Participation Restrictions  Community Activity    Stability/Clinical Decision Making  Evolving/Moderate complexity    Rehab Potential  Good    PT Frequency  Other (  comment)   3 visits   PT Duration  Other (comment)   2 weeks plus eval, then 1x/wk for 3 weeks   PT Treatment/Interventions  ADLs/Self Care Home Management;Gait training;DME Instruction;Electrical  Stimulation;Stair training;Functional mobility training;Therapeutic activities;Therapeutic exercise;Balance training;Neuromuscular re-education;Patient/family education;Orthotic Fit/Training    PT Next Visit Plan  Awaiting medicaid approval for additional visits.  Continue LLE stregnthening, SLS and stair negotitation.    Consulted and Agree with Plan of Care  Patient       Patient will benefit from skilled therapeutic intervention in order to improve the following deficits and impairments:  Abnormal gait, Decreased balance, Decreased mobility, Difficulty walking, Decreased range of motion, Decreased strength, Impaired sensation, Impaired flexibility  Visit Diagnosis: Muscle weakness (generalized)  Other abnormalities of gait and mobility  Unsteadiness on feet     Problem List Patient Active Problem List   Diagnosis Date Noted  . Smoker 05/06/2016  . Bipolar disorder [facility]) 05/06/2016    [facility] [address] [place], [place], [address] Phone: [phone]   Fax:  [phone]  Name: [name] MRN: [id number] Date of Birth: 09/18/1971

## 2019-11-20 ENCOUNTER — Ambulatory Visit: Payer: Medicaid Other | Admitting: Physical Therapy

## 2019-11-23 ENCOUNTER — Ambulatory Visit: Payer: Medicaid Other | Admitting: Physical Therapy

## 2019-11-27 ENCOUNTER — Ambulatory Visit: Payer: Medicaid Other | Admitting: Physical Therapy

## 2019-11-29 ENCOUNTER — Other Ambulatory Visit: Payer: Self-pay

## 2019-11-29 ENCOUNTER — Encounter: Payer: Self-pay | Admitting: Physical Therapy

## 2019-11-29 ENCOUNTER — Ambulatory Visit: Payer: Medicaid Other | Attending: Family Medicine | Admitting: Physical Therapy

## 2019-11-29 DIAGNOSIS — M25632 Stiffness of left wrist, not elsewhere classified: Secondary | ICD-10-CM | POA: Insufficient documentation

## 2019-11-29 DIAGNOSIS — R2681 Unsteadiness on feet: Secondary | ICD-10-CM

## 2019-11-29 DIAGNOSIS — R278 Other lack of coordination: Secondary | ICD-10-CM | POA: Insufficient documentation

## 2019-11-29 DIAGNOSIS — M25642 Stiffness of left hand, not elsewhere classified: Secondary | ICD-10-CM | POA: Insufficient documentation

## 2019-11-29 DIAGNOSIS — R29818 Other symptoms and signs involving the nervous system: Secondary | ICD-10-CM | POA: Insufficient documentation

## 2019-11-29 DIAGNOSIS — R2689 Other abnormalities of gait and mobility: Secondary | ICD-10-CM | POA: Insufficient documentation

## 2019-11-29 DIAGNOSIS — R209 Unspecified disturbances of skin sensation: Secondary | ICD-10-CM | POA: Insufficient documentation

## 2019-11-29 DIAGNOSIS — M6281 Muscle weakness (generalized): Secondary | ICD-10-CM

## 2019-11-29 NOTE — Patient Instructions (Signed)
Access Code: [email or web address]  URL: [email or web address]  Date: 11/29/2019  Prepared by: Mady Haagensen   Exercises Sit to Stand in Stride with AFO and UE Assist in LE Alignment - 10 reps - 2 sets - 1x daily - 5x weekly Standing Gastroc Stretch at Counter - 3 reps - 1 sets - 15-30 sec hold - 2-3x daily - 7x weekly Single Leg Stance with Support - 3 reps - 1 sets - 10 sec hold - 1x daily - 5x weekly Standing Hip Abduction with Resistance at Ankles - 10 reps - 2 sets - 1x daily - 5x weekly Standing March with Counter Support - 10 reps - 1x daily - 5x weekly Side Stepping with Resistance at Ankles - 10 reps - 2 sets - 1x daily - 5x weekly Hip Extension with Resistance Loop - 10 reps - 1 sets - 2x daily - 5x weekly  Added 11/29/2019 Mini Squat with Counter Support - 10 reps - 2 sets - 1x daily - 7x weekly Seated Heel Slide - 10 reps - 1 sets - 10-15 sec hold - 1x daily - 7x weekly

## 2019-11-29 NOTE — Therapy (Addendum)
[facility] [address] [place], [place], [address] Phone: [phone]   Fax:  [phone]  Physical Therapy Treatment  Patient Details  Name: [name] MRN: [id number] Date of Birth: 1971/05/31 Referring Provider (PT): Horald Pollen   Encounter Date: 11/29/2019  PT End of Session - 11/29/19 1527    Visit Number  5   11/29/2019 visit is a [place] visit-Medicaid auth not yet received   Number of Visits  12    Date for PT Re-Evaluation  81/77/11   recert 04/27/7902   Authorization Type  Medicaid-initial Josem Kaufmann was 3 visits prior to 11/19/2019; pt was scheduled and seen (No charge visit) 11/28/2018-resubmitted Medicaid auth after that visit    PT Start Time  1446    PT Stop Time  1520    PT Time Calculation (min)  34 min    Activity Tolerance  Patient tolerated treatment well    Behavior During Therapy  [facility] for tasks assessed/performed       Past Medical History:  Diagnosis Date  . Bipolar 1 disorder ([place])    followed by psychiatry  . Chronic kidney disease    hx of short term dialysis  . 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 ETOH abuse   . Foot drop, left   . Foot drop, left foot   . Polysubstance abuse ([place])    history of  . Smoker   . Swallowing difficulty    cannot swallow pills  . Teeth decayed   . Vitamin D deficiency     Past Surgical History:  Procedure Laterality Date  . arm surgery Left    compartment syndrome surgeries  . CESAREAN SECTION     fetal distress  . HAND SURGERY     for fracture  . TONSILLECTOMY    . TOOTH EXTRACTION Bilateral 10/26/2018   Procedure: MULTIPLE EXTRACTION # 18, 21, 22, 27, 28;  Surgeon: Diona Browner, DDS;  Location: [place];  Service: Oral Surgery;  Laterality: Bilateral;    There were no vitals filed for this visit.  Subjective Assessment - 11/29/19 1453    Subjective  No changes; not sure which is more a priority-my leg or my arm.     Pertinent History  PMH include bipolar, cellulits BLEs, rhabdomyolysis, ETOH and polysubstance abuse, L arm surgery due to compartment syndrome    Patient Stated Goals  Pt's goal for PT is to get all the function back in my L foot.    Currently in Pain?  No/denies                       [facility] Adult PT Treatment/Exercise - 11/29/19 0001      Transfers   Transfers  Sit to Stand;Stand to Sit    Sit to Stand  6: Modified independent (Device/Increase time);Without upper extremity assist;From chair/3-in-1    Five time sit to stand comments   10.38   second trial 8.03 sec   Stand to Sit  6: Modified independent (Device/Increase time);Without upper extremity assist;To chair/3-in-1      High Level Balance   High Level Balance Comments  SLS:  RLE:  10 sec; LLE:  0.83 sec, 1.12 sec 1.03 sec        REviewed HEP, with pt return demo understanding:   Exercises Sit to Stand in Stride with AFO and UE Assist in LE Alignment - 10 reps - 2 sets - 1x  daily - 5x weekly Standing Gastroc Stretch at [employer] - 3 reps - 1 sets - 15-30 sec hold - 2-3x daily - 7x weekly Single Leg Stance with Support - 3 reps - 1 sets - 10 sec hold - 1x daily - 5x weekly Standing Hip Abduction with Resistance at Ankles - 10 reps - 2 sets - 1x daily - 5x weekly Standing March with Counter Support - 10 reps - 1x daily - 5x weekly Side Stepping with Resistance at Ankles - 10 reps - 2 sets - 1x daily - 5x weekly Hip Extension with Resistance Loop - 10 reps - 1 sets - 2x daily - 5x weekly  Reviewed seated gastroc stretch with belt; pt has difficulty, so replaced with seated heelslide stretch  Pt performed the following, and added to HEP: Mini Squat with Counter Support - 10 reps - 2 sets - 1x daily - 7x weekly Seated Heel Slide - 10 reps - 1 sets - 10-15 sec hold - 1x daily - 7x weekly   Discussed POC and PT/OT visits; pt agrees to PT asking for 3 visits for Medicaid initially.   PT Education - 11/29/19 1527     Education Details  Additions to HEP; progress towards goals, POC    Person(s) Educated  Patient    Methods  Explanation;Demonstration;Handout    Comprehension  Verbalized understanding;Returned demonstration       PT Short Term Goals - 11/29/19 1545      PT SHORT TERM GOAL #1   Title  Patient will be independent with basic HEP for LLE strengthening, stretching, and balance/gait.  (Target for all STGs 11/23/2019)    Baseline  not currently doing HEP previously issude > 1 yr ago    Time  2    Period  Weeks    Status  Achieved      PT SHORT TERM GOAL #2   Title  Pt will perform 5x sit<>stand wit BLEs in equal weightbearing position, in less than 9 seconds, to demonstrate improved functional strength and use of LLE.    Baseline  5x sit<>stand RLE positioned in posterior position 8.28 sec; equal foot position 11/29/2019, 8 sec    Time  2    Period  Weeks    Status  Achieved      PT SHORT TERM GOAL #3   Title  Pt will improve single limb stance to at least 2 seconds on LLE for improved functional LLE strength.    Baseline  0.64 sec and 0.81 sec LLE stance; 1.12 sec at best LLE 11/29/2019    Time  2    Period  Weeks    Status  Not Met        PT Long Term Goals - 11/02/19 1353      PT LONG TERM GOAL #1   Title  Patient will be independent with progression of HEP (Target all LTGs by 6th visit following eval--12/22/2019)    Baseline  Not currently doing HEP at eval    Time  6    Period  Weeks    Status  New      PT LONG TERM GOAL #2   Title  Pt will improve DGI score to at least 19/24 for decreased fall risk.    Baseline  DGI 16/24; scores <19/24 indicate increased fall risk.    Time  6    Period  Weeks    Status  New      PT LONG TERM GOAL #  3   Title  Pt will improve tandem stance with LLE in posterior position to at least 15 seconds demonstrating improved LLE strength.    Baseline  tandem stance with LLE in posterior position 5.81 sec    Time  6    Period  Weeks    Status   New      PT LONG TERM GOAL #4   Title  Pt will negotiate at least 4 steps with bilateral handrails, step through pattern, modified independently.    Baseline  step to pattern with bilateral rails, RLE leading up and RLE leading down    Time  6    Period  Weeks    Status  New            Plan - 11/29/19 1534    Clinical Impression Statement  PT had mistake on sending information for reauth for Medicaid prior to this visit today(PT thought it was requested, but PT did not forward information to send for reauth request).  Pt seen as no charge visit today; STGs assessed, with pt meeting 2 of 3 STGs.  She has improved 5x sit<>stand and is independent with HEP.  SLS in LLE has improved to 1 second, but just not to goal level.  Pt would benefit from skilled PT to address further strengthening, balance,a nd gait training for improved funcitonal mobility and decreased fall risk.    Personal Factors and Comorbidities  Comorbidity 3+;Behavior Pattern    Comorbidities  HX of bipolar, cellulits BLEs, rhabdomyolysis, ETOH and polysubstance abuse, L arm surgery due to compartment syndrome    Examination-Activity Limitations  Locomotion Level;Squat;Stairs;Stand    Examination-Participation Restrictions  Community Activity    Stability/Clinical Decision Making  Evolving/Moderate complexity    Rehab Potential  Good    PT Frequency  Other (comment)   1x/wk for 3 weeks, then 1x/wk for 4 weeks, per recert 06/27/1323   PT Duration  Other (comment)   7 weeks total POC   PT Treatment/Interventions  ADLs/Self Care Home Management;Gait training;DME Instruction;Electrical Stimulation;Stair training;Functional mobility training;Therapeutic activities;Therapeutic exercise;Balance training;Neuromuscular re-education;Patient/family education;Orthotic Fit/Training    PT Next Visit Plan  Review updates to HEP; requested pt bring in lace-up shoe for trial of L foot up brace; PT has submitted for 1 visit over 3 weeks; OT to  eval on 12/01/2019 with recommendations    Consulted and Agree with Plan of Care  Patient       Patient will benefit from skilled therapeutic intervention in order to improve the following deficits and impairments:  Abnormal gait, Decreased balance, Decreased mobility, Difficulty walking, Decreased range of motion, Decreased strength, Impaired sensation, Impaired flexibility  Visit Diagnosis: Muscle weakness (generalized)  Other abnormalities of gait and mobility  Unsteadiness on feet     Problem List Patient Active Problem List   Diagnosis Date Noted  . Smoker 05/06/2016  . Bipolar disorder ([place]) 05/06/2016    Diogo Anne W. 11/29/2019, 3:52 PM  Frazier Butt., PT   [facility] [address] [place], [place], [address] Phone: [phone]   Fax:  [phone]  Name: BRAYLYN EYE MRN: [id number] Date of Birth: 05-05-1971   Updated goals for recert: PT Short Term Goals - 11/29/19 1552      PT SHORT TERM GOAL #1   Title  Patient will be independent with progression of HEP for LLE strengthening, stretching, and balance/gait.  (Target for all STGs 12/22/2019)    Baseline  HEP initiated with pt  independent; updates provided 11/29/2019    Time  3    Period  Weeks    Status  Revised      PT SHORT TERM GOAL #2   Title  Pt will perform 10 reps sit<>stand with no UE support, BLES in equal weightbearing position for improved functional strength and use of LLE.    Baseline  5x sit<>stand RLE positioned in posterior position 8.28 sec; equal foot position 11/29/2019, 8 sec    Time  3    Period  Weeks    Status  Revised      PT SHORT TERM GOAL #3   Title  Pt will improve single limb stance to at least 2 seconds on LLE for improved functional LLE strength.    Baseline  0.64 sec and 0.81 sec LLE stance; 1.12 sec at best LLE 11/29/2019    Time  3    Period  Weeks    Status  On-going      PT Long Term Goals - 11/29/19  1606      PT LONG TERM GOAL #1   Title  Patient will be independent with progression of HEP (Target for all LTGs:  01/26/2020)    Baseline  HEP initiated and revised, 11/29/2019    Time  7    Period  Weeks    Status  Revised      PT LONG TERM GOAL #2   Title  Pt will improve DGI score to at least 19/24 for decreased fall risk.    Baseline  DGI 16/24; scores <19/24 indicate increased fall risk.    Time  7    Period  Weeks    Status  On-going      PT LONG TERM GOAL #3   Title  Pt will improve tandem stance with LLE in posterior position to at least 15 seconds demonstrating improved LLE strength.    Baseline  tandem stance with LLE in posterior position 5.81 sec    Time  7    Period  Weeks    Status  On-going      PT LONG TERM GOAL #4   Title  Pt will negotiate at least 4 steps with bilateral handrails, step through pattern, modified independently.    Baseline  step to pattern with bilateral rails, RLE leading up and RLE leading down    Time  7    Period  Weeks    Status  On-going      Mady Haagensen, [place] 11/29/19 4:09 PM Phone: [phone] Fax: [phone]

## 2019-12-01 ENCOUNTER — Ambulatory Visit: Payer: Medicaid Other | Admitting: Occupational Therapy

## 2019-12-08 ENCOUNTER — Encounter: Payer: Self-pay | Admitting: Physical Therapy

## 2019-12-08 ENCOUNTER — Other Ambulatory Visit: Payer: Self-pay

## 2019-12-08 ENCOUNTER — Ambulatory Visit: Payer: Medicaid Other | Admitting: Physical Therapy

## 2019-12-08 DIAGNOSIS — R278 Other lack of coordination: Secondary | ICD-10-CM | POA: Diagnosis present

## 2019-12-08 DIAGNOSIS — R2689 Other abnormalities of gait and mobility: Secondary | ICD-10-CM | POA: Diagnosis present

## 2019-12-08 DIAGNOSIS — M25642 Stiffness of left hand, not elsewhere classified: Secondary | ICD-10-CM | POA: Diagnosis present

## 2019-12-08 DIAGNOSIS — M6281 Muscle weakness (generalized): Secondary | ICD-10-CM | POA: Diagnosis not present

## 2019-12-08 DIAGNOSIS — R2681 Unsteadiness on feet: Secondary | ICD-10-CM

## 2019-12-08 DIAGNOSIS — M25632 Stiffness of left wrist, not elsewhere classified: Secondary | ICD-10-CM | POA: Diagnosis present

## 2019-12-08 DIAGNOSIS — R29818 Other symptoms and signs involving the nervous system: Secondary | ICD-10-CM | POA: Diagnosis present

## 2019-12-08 DIAGNOSIS — R209 Unspecified disturbances of skin sensation: Secondary | ICD-10-CM | POA: Diagnosis present

## 2019-12-08 NOTE — Patient Instructions (Signed)
Step: Up, Anterior    Stand facing step. Place involved leg up. Raise body using top leg only. Step down backward, involved leg first, lower body using other leg. Repeat 15 times per set. Do 2 sets per session.  Copyright  VHI. All rights reserved.   Step: Up, Lateral    Stand with side toward step. Place involved leg up. Raise body using top leg only. Lower self back down. Repeat 15 times per set. Do 2 sets per session.   Copyright  VHI. All rights reserved.

## 2019-12-08 NOTE — Therapy (Signed)
[facility] [address] [place], [place], [address] Phone: [phone]   Fax:  [phone]  Physical Therapy Treatment  Patient Details  Name: [name] MRN: [id number] Date of Birth: 06/25/1971 Referring Provider (PT): Horald Pollen   Encounter Date: 12/08/2019  PT End of Session - 12/08/19 1159    Visit Number  6    Number of Visits  12    Date for PT Re-Evaluation  [id number]   recert [id number]   Authorization Type  Medicaid-initial Josem Kaufmann was 3 visits prior to 11/19/2019; pt was scheduled and seen (No charge visit) 11/28/2018-resubmitted Medicaid auth after that visit    Authorization - Visit Number  1    Authorization - Number of Visits  3    PT Start Time  1102    PT Stop Time  1148    PT Time Calculation (min)  46 min    Activity Tolerance  Patient tolerated treatment well    Behavior During Therapy  [facility] for tasks assessed/performed       Past Medical History:  Diagnosis Date  . Bipolar 1 disorder ([place])    followed by psychiatry  . Chronic kidney disease    hx of short term dialysis  . 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 ETOH abuse   . Foot drop, left   . Foot drop, left foot   . Polysubstance abuse ([place])    history of  . Smoker   . Swallowing difficulty    cannot swallow pills  . Teeth decayed   . Vitamin D deficiency     Past Surgical History:  Procedure Laterality Date  . arm surgery Left    compartment syndrome surgeries  . CESAREAN SECTION     fetal distress  . HAND SURGERY     for fracture  . TONSILLECTOMY    . TOOTH EXTRACTION Bilateral 10/26/2018   Procedure: MULTIPLE EXTRACTION # 18, 21, 22, 27, 28;  Surgeon: Diona Browner, DDS;  Location: [place];  Service: Oral Surgery;  Laterality: Bilateral;    There were no vitals filed for this visit.  Subjective Assessment - 12/08/19 1110    Subjective  going to see ortho md this afternoon.  Pt reports being  concerned about ther L toe/toes.  Feels like L foot is also more sensative.    Pertinent History  PMH include bipolar, cellulits BLEs, rhabdomyolysis, ETOH and polysubstance abuse, L arm surgery due to compartment syndrome    Patient Stated Goals  Pt's goal for PT is to get all the function back in my L foot.    Currently in Pain?  No/denies             [facility] Adult PT Treatment/Exercise - 12/08/19 0001      Transfers   Transfers  Sit to Stand;Stand to Sit    Sit to Stand  6: Modified independent (Device/Increase time);Without upper extremity assist;From chair/3-in-1    Stand to Sit  6: Modified independent (Device/Increase time);Without upper extremity assist;To chair/3-in-1      Ambulation/Gait   Ambulation/Gait  Yes    Ambulation/Gait Assistance  5: Supervision    Ambulation Distance (Feet)  200 Feet   x 1, 115 x2 and through out clinic for activities   Assistive device  None    Gait Pattern  Step-through pattern;Decreased arm swing - left;Decreased step length - left;Decreased dorsiflexion - left;Decreased weight shift to left;Decreased trunk rotation;Poor foot clearance -  left;Left steppage    Stairs  Yes    Stairs Assistance  5: Supervision    Stair Management Technique  No rails;Alternating pattern;Forwards    Number of Stairs  8    Height of Stairs  6    Gait Comments  Trial of foot up brace on the L side.  Pt did report improvement in foot clearance and no discomfort but still with some foot slap present.  Discussed heel/toe gait pattern.  Pt also tends to externally rotate pelvis to the L with swing on L foot (?due to poor foot clearance with initial injury).  Discussed neutral pelvis with gait.       Self-Care   Self-Care  Other Self-Care Comments    Other Self-Care Comments   foot up brace, where to obtain, gait deviations      Exercises   Exercises  Ankle;Knee/Hip      Knee/Hip Exercises: Stretches   Gastroc Stretch  Left;2 reps;30 seconds    Gastroc Stretch  Limitations  performed runners stretch at wall    Soleus Stretch  Left;1 rep;20 seconds    Soleus Stretch Limitations  seated on edge of mat      Knee/Hip Exercises: Standing   Lateral Step Up  Left;2 sets;15 reps;Hand Hold: 1;Step Height: 6"    Forward Step Up  Left;2 sets;15 reps;Hand Hold: 1;Step Height: 6"    Other Standing Knee Exercises  pelvic leveling with LLE on step and UE assist.  needed manual facilitation.             PT Education - 12/08/19 1151    Education Details  additions to HEP, where to obtain foot up brace    Person(s) Educated  Patient    Methods  Explanation;Demonstration    Comprehension  Verbalized understanding;Returned demonstration       PT Short Term Goals - 11/29/19 1552      PT SHORT TERM GOAL #1   Title  Patient will be independent with progression of HEP for LLE strengthening, stretching, and balance/gait.  (Target for all STGs 12/22/2019)    Baseline  HEP initiated with pt independent; updates provided 11/29/2019    Time  3    Period  Weeks    Status  Revised      PT SHORT TERM GOAL #2   Title  Pt will perform 10 reps sit<>stand with no UE support, BLES in equal weightbearing position for improved functional strength and use of LLE.    Baseline  5x sit<>stand RLE positioned in posterior position 8.28 sec; equal foot position 11/29/2019, 8 sec    Time  3    Period  Weeks    Status  Revised      PT SHORT TERM GOAL #3   Title  Pt will improve single limb stance to at least 2 seconds on LLE for improved functional LLE strength.    Baseline  0.64 sec and 0.81 sec LLE stance; 1.12 sec at best LLE 11/29/2019    Time  3    Period  Weeks    Status  On-going        PT Long Term Goals - 11/29/19 1606      PT LONG TERM GOAL #1   Title  Patient will be independent with progression of HEP (Target for all LTGs:  01/26/2020)    Baseline  HEP initiated and revised, 11/29/2019    Time  7    Period  Weeks    Status  Revised      PT LONG TERM GOAL #2    Title  Pt will improve DGI score to at least 19/24 for decreased fall risk.    Baseline  DGI 16/24; scores <19/24 indicate increased fall risk.    Time  7    Period  Weeks    Status  On-going      PT LONG TERM GOAL #3   Title  Pt will improve tandem stance with LLE in posterior position to at least 15 seconds demonstrating improved LLE strength.    Baseline  tandem stance with LLE in posterior position 5.81 sec    Time  7    Period  Weeks    Status  On-going      PT LONG TERM GOAL #4   Title  Pt will negotiate at least 4 steps with bilateral handrails, step through pattern, modified independently.    Baseline  step to pattern with bilateral rails, RLE leading up and RLE leading down    Time  7    Period  Weeks    Status  On-going            Plan - 12/08/19 1200    Clinical Impression Statement  Pt with improved foot clearance and comfort with foot up brace.  Does not wear her AFO due to discomfort.  Session focused on gait and exercises to address L hip weakness.  Continue PT per POC.    Personal Factors and Comorbidities  Comorbidity 3+;Behavior Pattern    Comorbidities  HX of bipolar, cellulits BLEs, rhabdomyolysis, ETOH and polysubstance abuse, L arm surgery due to compartment syndrome    Examination-Activity Limitations  Locomotion Level;Squat;Stairs;Stand    Examination-Participation Restrictions  Community Activity    Stability/Clinical Decision Making  Evolving/Moderate complexity    Rehab Potential  Good    PT Frequency  Other (comment)   1x/wk for 3 weeks, then 1x/wk for 4 weeks, per recert [id number]   PT Duration  Other (comment)   7 weeks total POC   PT Treatment/Interventions  ADLs/Self Care Home Management;Gait training;DME Instruction;Electrical Stimulation;Stair training;Functional mobility training;Therapeutic activities;Therapeutic exercise;Balance training;Neuromuscular re-education;Patient/family education;Orthotic Fit/Training    PT Next Visit Plan  continue  LLE strengthening and balance activities.  Ask about foot up brace.    Consulted and Agree with Plan of Care  Patient       Patient will benefit from skilled therapeutic intervention in order to improve the following deficits and impairments:  Abnormal gait, Decreased balance, Decreased mobility, Difficulty walking, Decreased range of motion, Decreased strength, Impaired sensation, Impaired flexibility  Visit Diagnosis: Muscle weakness (generalized)  Other abnormalities of gait and mobility  Unsteadiness on feet     Problem List Patient Active Problem List   Diagnosis Date Noted  . Smoker 05/06/2016  . Bipolar disorder ([place]) 05/06/2016    Narda Bonds, PTA [place] 12/08/19 12:03 PM Phone: [phone] Fax: [facility] [address] [place], [place], [address] Phone: [phone]   Fax:  [phone]  Name: MAKENZIE SNELLGROVE MRN: [id number] Date of Birth: [birth year]

## 2019-12-15 ENCOUNTER — Ambulatory Visit: Payer: Medicaid Other | Admitting: Physical Therapy

## 2019-12-15 ENCOUNTER — Other Ambulatory Visit: Payer: Self-pay

## 2019-12-15 ENCOUNTER — Ambulatory Visit: Payer: Medicaid Other | Admitting: Occupational Therapy

## 2019-12-15 ENCOUNTER — Encounter: Payer: Self-pay | Admitting: Physical Therapy

## 2019-12-15 DIAGNOSIS — R2689 Other abnormalities of gait and mobility: Secondary | ICD-10-CM

## 2019-12-15 DIAGNOSIS — M6281 Muscle weakness (generalized): Secondary | ICD-10-CM | POA: Diagnosis not present

## 2019-12-15 DIAGNOSIS — R2681 Unsteadiness on feet: Secondary | ICD-10-CM

## 2019-12-15 NOTE — Patient Instructions (Signed)
Tandem Stance    Right foot in front of left, heel touching toe both feet "straight ahead". Stand on Foot Triangle of Support with both feet. Balance in this position 10___ seconds. Do with left foot in front of right.  Copyright  VHI. All rights reserved.   Feet Heel-Toe "Tandem"    Arms outstretched, walk a straight line bringing one foot directly in front of the other. Repeat for 6 feet x 6 times. Do 2-3_ sessions per day.  Copyright  VHI. All rights reserved.   Braiding    Move to side: 1) cross right leg in front of left, 2) bring back leg out to side, then 3) cross right leg behind left, 4) bring left leg out to side. Continue sequence in same direction. Reverse sequence, moving in opposite direction. Repeat sequence 6 times per session. Do 2-3____ sessions per day.   Copyright  VHI. All rights reserved.

## 2019-12-15 NOTE — Therapy (Signed)
[facility] [address] [place], [place], [address] Phone: [phone]   Fax:  [phone]  Physical Therapy Treatment  Patient Details  Name: [name] MRN: [id number] Date of Birth: October 22, 1971 Referring Provider (PT): Horald Pollen   Encounter Date: 12/15/2019  PT End of Session - 12/15/19 0942    Visit Number  7    Number of Visits  12    Date for PT Re-Evaluation  [id number]   recert [id number]   Authorization Type  Medicaid-initial Josem Kaufmann was 3 visits prior to 11/19/2019; pt was scheduled and seen (No charge visit) 11/28/2018-resubmitted Medicaid auth after that visit    Authorization - Visit Number  2    Authorization - Number of Visits  3    PT Start Time  0933    PT Stop Time  1016    PT Time Calculation (min)  43 min    Activity Tolerance  Patient tolerated treatment well    Behavior During Therapy  [facility] for tasks assessed/performed       Past Medical History:  Diagnosis Date  . Bipolar 1 disorder ([place])    followed by psychiatry  . Chronic kidney disease    hx of short term dialysis  . 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 ETOH abuse   . Foot drop, left   . Foot drop, left foot   . Polysubstance abuse ([place])    history of  . Smoker   . Swallowing difficulty    cannot swallow pills  . Teeth decayed   . Vitamin D deficiency     Past Surgical History:  Procedure Laterality Date  . arm surgery Left    compartment syndrome surgeries  . CESAREAN SECTION     fetal distress  . HAND SURGERY     for fracture  . TONSILLECTOMY    . TOOTH EXTRACTION Bilateral 10/26/2018   Procedure: MULTIPLE EXTRACTION # 18, 21, 22, 27, 28;  Surgeon: Diona Browner, DDS;  Location: [facility];  Service: Oral Surgery;  Laterality: Bilateral;    There were no vitals filed for this visit.  Subjective Assessment - 12/15/19 0937    Subjective  Denies any pain or changes since last visit.  got an  order for foot up brace from md.  ortho md said to use toe spreaders on L foot.    Pertinent History  PMH include bipolar, cellulits BLEs, rhabdomyolysis, ETOH and polysubstance abuse, L arm surgery due to compartment syndrome    Patient Stated Goals  Pt's goal for PT is to get all the function back in my L foot.    Currently in Pain?  No/denies          [facility] Adult PT Treatment/Exercise - 12/15/19 0001      Transfers   Transfers  Sit to Stand;Stand to Sit    Sit to Stand  6: Modified independent (Device/Increase time);Without upper extremity assist;From chair/3-in-1    Stand to Sit  6: Modified independent (Device/Increase time)    Number of Reps  10 reps    Transfer Cueing  cues to equal WB    Comments  Placed R foot on half bubble to encourage WB thru LLE.      Ambulation/Gait   Ambulation/Gait  Yes    Ambulation/Gait Assistance  5: Supervision    Ambulation Distance (Feet)  --   around clinic for activities   Assistive device  None  Gait Pattern  Step-through pattern;Decreased arm swing - left;Decreased step length - left;Decreased dorsiflexion - left;Decreased weight shift to left;Decreased trunk rotation;Poor foot clearance - left;Left steppage      Exercises   Exercises  Knee/Hip      Knee/Hip Exercises: Aerobic   Other Aerobic  Scifit level 2.5 all 4 extremities x 8 minutes with constant cues to keep >60 rpm      Knee/Hip Exercises: Standing   Other Standing Knee Exercises  Standing in // bars with 4# weight on LLE for 10 reps x 2 set of hip abd, hip extension, hamstring curl, straight leg hip flexion and marching/hip flexion.  Performed tandem stance x 10 sec x 2 each direction and tandem gait 8'x4 reps and retro tandem gait x8'x4 reps.  SLS 10 sec x 2 reps.  Stepping activities on 6" step x 10 reps then 8" step x 10 reps of forward stepping onto/off, stepping down/back up and lateral stepping.  Standing on BOSU blue surface for 45 sec x 2 reps and on black surface 45 sec  x 2 reps then squats on black surface 10 reps x 2 sets.     Other Standing Knee Exercises  braiding 8' x 6 reps             PT Education - 12/15/19 1140    Education Details  additions to HEP-tandem standing, tandem gait and braiding    Person(s) Educated  Patient    Methods  Explanation;Demonstration    Comprehension  Verbalized understanding;Returned demonstration       PT Short Term Goals - 11/29/19 1552      PT SHORT TERM GOAL #1   Title  Patient will be independent with progression of HEP for LLE strengthening, stretching, and balance/gait.  (Target for all STGs 12/22/2019)    Baseline  HEP initiated with pt independent; updates provided 11/29/2019    Time  3    Period  Weeks    Status  Revised      PT SHORT TERM GOAL #2   Title  Pt will perform 10 reps sit<>stand with no UE support, BLES in equal weightbearing position for improved functional strength and use of LLE.    Baseline  5x sit<>stand RLE positioned in posterior position 8.28 sec; equal foot position 11/29/2019, 8 sec    Time  3    Period  Weeks    Status  Revised      PT SHORT TERM GOAL #3   Title  Pt will improve single limb stance to at least 2 seconds on LLE for improved functional LLE strength.    Baseline  0.64 sec and 0.81 sec LLE stance; 1.12 sec at best LLE 11/29/2019    Time  3    Period  Weeks    Status  On-going        PT Long Term Goals - 11/29/19 1606      PT LONG TERM GOAL #1   Title  Patient will be independent with progression of HEP (Target for all LTGs:  01/26/2020)    Baseline  HEP initiated and revised, 11/29/2019    Time  7    Period  Weeks    Status  Revised      PT LONG TERM GOAL #2   Title  Pt will improve DGI score to at least 19/24 for decreased fall risk.    Baseline  DGI 16/24; scores <19/24 indicate increased fall risk.    Time  7  Period  Weeks    Status  On-going      PT LONG TERM GOAL #3   Title  Pt will improve tandem stance with LLE in posterior position to at  least 15 seconds demonstrating improved LLE strength.    Baseline  tandem stance with LLE in posterior position 5.81 sec    Time  7    Period  Weeks    Status  On-going      PT LONG TERM GOAL #4   Title  Pt will negotiate at least 4 steps with bilateral handrails, step through pattern, modified independently.    Baseline  step to pattern with bilateral rails, RLE leading up and RLE leading down    Time  7    Period  Weeks    Status  On-going            Plan - 12/15/19 1142    Clinical Impression Statement  Skilled session focused on LLE strengthening and balance.  Pt has 1 more visit approved.  Will check goals next session and discuss with Mady Haagensen, PT regarding, d/c vs requesting additional viists.    Personal Factors and Comorbidities  Comorbidity 3+;Behavior Pattern    Comorbidities  HX of bipolar, cellulits BLEs, rhabdomyolysis, ETOH and polysubstance abuse, L arm surgery due to compartment syndrome    Examination-Activity Limitations  Locomotion Level;Squat;Stairs;Stand    Examination-Participation Restrictions  Community Activity    Stability/Clinical Decision Making  Evolving/Moderate complexity    Rehab Potential  Good    PT Frequency  Other (comment)   1x/wk for 3 weeks, then 1x/wk for 4 weeks, per recert [id number]   PT Duration  Other (comment)   7 weeks total POC   PT Treatment/Interventions  ADLs/Self Care Home Management;Gait training;DME Instruction;Electrical Stimulation;Stair training;Functional mobility training;Therapeutic activities;Therapeutic exercise;Balance training;Neuromuscular re-education;Patient/family education;Orthotic Fit/Training    PT Next Visit Plan  Discuss plan with Mady Haagensen, PT.  Check goals.    Consulted and Agree with Plan of Care  Patient       Patient will benefit from skilled therapeutic intervention in order to improve the following deficits and impairments:  Abnormal gait, Decreased balance, Decreased mobility, Difficulty  walking, Decreased range of motion, Decreased strength, Impaired sensation, Impaired flexibility  Visit Diagnosis: Muscle weakness (generalized)  Other abnormalities of gait and mobility  Unsteadiness on feet     Problem List Patient Active Problem List   Diagnosis Date Noted  . Smoker 05/06/2016  . Bipolar disorder ([place]) 05/06/2016    Narda Bonds, PTA [place] [facility] 12/15/19 11:44 AM Phone: [phone] Fax: [facility] [address] [place], [place], [address] Phone: [phone]   Fax:  [phone]  Name: KODY PURECO MRN: [id number] Date of Birth: Jan 17, 1971

## 2019-12-20 ENCOUNTER — Ambulatory Visit: Payer: Medicaid Other | Admitting: Occupational Therapy

## 2019-12-20 ENCOUNTER — Other Ambulatory Visit: Payer: Self-pay

## 2019-12-20 ENCOUNTER — Encounter: Payer: Self-pay | Admitting: Occupational Therapy

## 2019-12-20 DIAGNOSIS — M25632 Stiffness of left wrist, not elsewhere classified: Secondary | ICD-10-CM

## 2019-12-20 DIAGNOSIS — R29818 Other symptoms and signs involving the nervous system: Secondary | ICD-10-CM

## 2019-12-20 DIAGNOSIS — R208 Other disturbances of skin sensation: Secondary | ICD-10-CM

## 2019-12-20 DIAGNOSIS — M25642 Stiffness of left hand, not elsewhere classified: Secondary | ICD-10-CM

## 2019-12-20 DIAGNOSIS — M6281 Muscle weakness (generalized): Secondary | ICD-10-CM | POA: Diagnosis not present

## 2019-12-20 DIAGNOSIS — R278 Other lack of coordination: Secondary | ICD-10-CM

## 2019-12-20 NOTE — Therapy (Signed)
[facility] [address] [place], [place], [address] Phone: [phone]   Fax:  [phone]  Occupational Therapy Evaluation  Patient Details  Name: [name] MRN: [id number] Date of Birth: 10-25-1971 Referring Provider (OT): Dr. Horald Pollen   Encounter Date: 12/20/2019  OT End of Session - 12/20/19 1509    Visit Number  1    Number of Visits  9    Date for OT Re-Evaluation  02/18/20    Authorization Type  Medicaid--awaiting authorization    OT Start Time  0850    OT Stop Time  0930    OT Time Calculation (min)  40 min    Activity Tolerance  Patient tolerated treatment well    Behavior During Therapy  [facility] for tasks assessed/performed       Past Medical History:  Diagnosis Date  . Bipolar 1 disorder ([place])    followed by psychiatry  . Chronic kidney disease    hx of short term dialysis  . 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 ETOH abuse   . Foot drop, left   . Foot drop, left foot   . Polysubstance abuse ([place])    history of  . Smoker   . Swallowing difficulty    cannot swallow pills  . Teeth decayed   . Vitamin D deficiency     Past Surgical History:  Procedure Laterality Date  . arm surgery Left    compartment syndrome surgeries  . CESAREAN SECTION     fetal distress  . HAND SURGERY     for fracture  . TONSILLECTOMY    . TOOTH EXTRACTION Bilateral 10/26/2018   Procedure: MULTIPLE EXTRACTION # 18, 21, 22, 27, 28;  Surgeon: Diona Browner, DDS;  Location: [facility];  Service: Oral Surgery;  Laterality: Bilateral;    There were no vitals filed for this visit.  Subjective Assessment - 12/20/19 0900    Subjective   When I was here before I couldn't open my hand at all.  It was stuck.    Patient Stated Goals  I would like to be able to use my hand correctly, hold things    Currently in Pain?  No/denies        [facility] OT Assessment - 12/20/19 0001      Assessment    Medical Diagnosis  rhabdomyolisis    Referring Provider (OT)  Dr. Horald Pollen    Onset Date/Surgical Date  12/15/19   MD referral date (original illness/injury 05/2018)   Hand Dominance  Right    Next MD Visit  PCP in next 2 weeks    Prior Therapy  OT last seen  09/2018      Precautions   Precautions  Fall      Balance Screen   Has the patient fallen in the past 6 months  Yes    How many times?  2   none recent (due to foot drop)     Home  Environment   Family/patient expects to be discharged to:  Private residence    Lives With  Son   49 y.o. son     Prior Function   Level of Independence  Independent    Vocation  --   previously applied for disability (bipolar)     ADL   Eating/Feeding  Modified independent   difficulty opening jars, uses scissors for packages   Grooming  Modified independent    Upper Body  Bathing  Modified independent    Lower Body Bathing  Modified independent    Upper Body Dressing  --   mod I   Lower Body Dressing  Needs assist for fasteners   unable to button, able to tie shoes   Toilet Transfer  Modified independent    Toileting - Clothing Manipulation  Modified independent    Toileting -  Hygiene  Modified Independent    Tub/Shower Transfer  Modified independent      IADL   Shopping  Takes care of all shopping needs independently    Prior Level of Function Light Housekeeping  mod I using RUE    Prior Level of Function Meal Prep  mostly microwave now    Investment banker, corporate own vehicle      Mobility   Mobility Status  Independent;History of falls    Mobility Status Comments  L foot drop      Sensation   Light Touch  Impaired by gross assessment    Hot/Cold  Impaired by gross assessment    Additional Comments  Decreased sensation to light touch LLE      Coordination   9 Hole Peg Test  Right;Left    Right 9 Hole Peg Test  18.47    Left 9 Hole Peg Test  32.18    Coordination  mod-max difficulty manipulating objects in L hand       ROM / Strength   AROM / PROM / Strength  AROM;PROM;Strength      AROM   Overall AROM   Deficits    Overall AROM Comments  Grossly WFL LUE except only 130* shoulder abduction, mild decr finger ext, unable to oppose to base of 5th digit, and decr full composite wrist/finger extension      Strength   Overall Strength  Deficits    Overall Strength Comments  LUE proximal stength grossly 3+/5 shoulder, 4-/5 biceps and triceps      Hand Function   Right Hand Grip (lbs)  55    Right Hand Lateral Pinch  14 lbs    Right Hand 3 Point Pinch  15 lbs    Left Hand Grip (lbs)  32.8    Left Hand Lateral Pinch  8 lbs    Left 3 point pinch  4 lbs                      OT Education - 12/20/19 1527    Education Details  OT Eval results/POC.  Assessed previous splints--these are no longer appropriate due to incr ROM in hand.    Person(s) Educated  Patient    Methods  Explanation    Comprehension  Verbalized understanding       OT Short Term Goals - 12/20/19 1519      OT SHORT TERM GOAL #1   Title  Pt will be independent with HEP for incr coordination.--check STGs 01/20/20    Baseline  dependent    Time  4    Period  Weeks    Status  New      OT SHORT TERM GOAL #2   Title  Pt will incr L grip strength by at least 5lbs for incr ease for grasping/lifting objects.    Baseline  32.8lbs    Time  4    Period  Weeks    Status  New      OT SHORT TERM GOAL #3   Title  Pt will improve lateral and  3point pinch strength by at least 2lbs each for incr ease with ADLs.    Baseline  lateral-8lbs, 3point 4lbs    Time  4    Period  Weeks    Status  New      OT SHORT TERM GOAL #4   Title  Pt wil improve L hand coordination for ADLs as shown by improving time on 9-hole peg test by at least 8sec.    Baseline  32.18sec    Time  4    Period  Weeks    Status  New      OT SHORT TERM GOAL #5   Title  ---        OT Long Term Goals - 12/20/19 1522      OT LONG TERM GOAL #1   Title   Pt will be independent with strengthening HEP.--check LTGs 02/17/20    Baseline  dependent    Time  8    Period  Weeks    Status  New      OT LONG TERM GOAL #2   Title  Pt improve L hand grip strength by at least 10lbs to assist in lifting/gripping objects.    Baseline  32.8lbs    Time  8    Period  Weeks    Status  New      OT LONG TERM GOAL #3   Title  Pt will be able to retrieve 3lb object from overhead shelf safely with LUE.    Baseline  unable    Time  8    Period  Weeks    Status  New      OT LONG TERM GOAL #4   Title  Pt will report use LUE as nondominant assist at least 90% of the time for ADLs/IADLs.    Baseline  pt reports that she typically does tasks with RUE only.    Time  8    Period  Weeks    Status  New      OT LONG TERM GOAL #5   Title  --            Plan - 12/20/19 1510    Clinical Impression Statement  Pt is a 49 y.o. female referred to OT for rhabdomyolisis. Pt with hx of exensive injuries from fall down stairs 05/27/18, pt was found after extended delay (05/29/18) and taken to ED.  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  PMH also includes  bipolar disorder and substance abuse.   Pt last seen for occupational theapy 09/2018.  Pt reports that when last seen by OT she could not open hand.  Pt reports that she is now able to open hand and wants to work on improved LUE functional use.  Pt present today with decr sensation, decr strength, decr coordination, decr ROM resulting in decr LUE functional use.  Pt would benefit from ocupational therapy to address these deficits for improved LUE functional use for incr ease with ADLs/IADLs.    OT Occupational Profile and History  Detailed Assessment- Review of Records and additional review of physical, cognitive, psychosocial history related to current functional  performance    Occupational performance deficits (Please refer to evaluation for details):  ADL's;IADL's    Body Structure / Function / Physical Skills  ADL;Dexterity;ROM;IADL;Balance;Sensation;Strength;FMC;Coordination;UE functional use    Rehab Potential  Good  Clinical Decision Making  Several treatment options, min-mod task modification necessary    Comorbidities Affecting Occupational Performance:  May have comorbidities impacting occupational performance    Modification or Assistance to Complete Evaluation   Min-Moderate modification of tasks or assist with assess necessary to complete eval    OT Frequency  1x / week    OT Duration  8 weeks   +eval   OT Treatment/Interventions  Self-care/ADL training;Moist Heat;Fluidtherapy;DME and/or AE instruction;Splinting;Therapeutic activities;Aquatic Therapy;Ultrasound;Therapeutic exercise;Scar mobilization;Passive range of motion;Neuromuscular education;Cryotherapy;Electrical Stimulation;Paraffin;Manual Therapy;Patient/family education    Plan  initiate HEP for LUE coordination and strength    Consulted and Agree with Plan of Care  Patient       Patient will benefit from skilled therapeutic intervention in order to improve the following deficits and impairments:   Body Structure / Function / Physical Skills: ADL, Dexterity, ROM, IADL, Balance, Sensation, Strength, FMC, Coordination, UE functional use       Visit Diagnosis: Muscle weakness (generalized)  Stiffness of left hand, not elsewhere classified  Stiffness of left wrist, not elsewhere classified  Other lack of coordination  Other disturbances of skin sensation  Other symptoms and signs involving the nervous system    Problem List Patient Active Problem List   Diagnosis Date Noted  . Smoker 05/06/2016  . Bipolar disorder ([place]) 05/06/2016    [facility] 12/20/2019, 3:28 PM  [facility] [address] [place], [place], [address] Phone: [phone]   Fax:  [phone]  Name: AZIZAH GILES MRN: [id number] Date of Birth: 1971-09-01   Vianne Bulls, OTR/L [facility] [address] [place], [place]  [phone] phone [phone] 12/20/19 3:28 PM

## 2019-12-22 ENCOUNTER — Encounter: Payer: Self-pay | Admitting: Physical Therapy

## 2019-12-22 ENCOUNTER — Other Ambulatory Visit: Payer: Self-pay

## 2019-12-22 ENCOUNTER — Ambulatory Visit: Payer: Medicaid Other | Admitting: Physical Therapy

## 2019-12-22 DIAGNOSIS — M6281 Muscle weakness (generalized): Secondary | ICD-10-CM | POA: Diagnosis not present

## 2019-12-22 DIAGNOSIS — R2681 Unsteadiness on feet: Secondary | ICD-10-CM

## 2019-12-22 DIAGNOSIS — R2689 Other abnormalities of gait and mobility: Secondary | ICD-10-CM

## 2019-12-22 NOTE — Therapy (Signed)
[facility] [address] [place], [place], [address] Phone: [phone]   Fax:  [phone]  Physical Therapy Treatment  Patient Details  Name: [name] MRN: [id number] Date of Birth: 05/28/1971 Referring Provider (PT): Horald Pollen   Encounter Date: 12/22/2019  PT End of Session - 12/22/19 1610    Visit Number  8    Number of Visits  12    Date for PT Re-Evaluation  98/33/82   recert 5/0/5397   Authorization Type  Medicaid-initial Josem Kaufmann was 3 visits prior to 11/19/2019; pt was scheduled and seen (No charge visit) 11/28/2018-resubmitted Medicaid auth after that visit    Authorization - Visit Number  3    Authorization - Number of Visits  3    PT Start Time  1315    PT Stop Time  1400    PT Time Calculation (min)  45 min    Activity Tolerance  Patient tolerated treatment well    Behavior During Therapy  [facility] for tasks assessed/performed       Past Medical History:  Diagnosis Date  . Bipolar 1 disorder ([place])    followed by psychiatry  . Chronic kidney disease    hx of short term dialysis  . 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 ETOH abuse   . Foot drop, left   . Foot drop, left foot   . Polysubstance abuse ([place])    history of  . Smoker   . Swallowing difficulty    cannot swallow pills  . Teeth decayed   . Vitamin D deficiency     Past Surgical History:  Procedure Laterality Date  . arm surgery Left    compartment syndrome surgeries  . CESAREAN SECTION     fetal distress  . HAND SURGERY     for fracture  . TONSILLECTOMY    . TOOTH EXTRACTION Bilateral 10/26/2018   Procedure: MULTIPLE EXTRACTION # 18, 21, 22, 27, 28;  Surgeon: Diona Browner, DDS;  Location: [facility];  Service: Oral Surgery;  Laterality: Bilateral;    There were no vitals filed for this visit.  Subjective Assessment - 12/22/19 1322    Subjective  Pt reports an "unpleasant sensation" in her L leg and  foot.  Thigh is feeling more normal with light touch.    Currently in Pain?  No/denies          [facility] Adult PT Treatment/Exercise - 12/22/19 0001      Transfers   Transfers  Sit to Stand;Stand to Sit    Sit to Stand  6: Modified independent (Device/Increase time);Without upper extremity assist;From chair/3-in-1    Stand to Sit  6: Modified independent (Device/Increase time)    Number of Reps  10 reps    Comments  10 reps with RLE slid forward to encourgage LLE weight bearing.      Ambulation/Gait   Ambulation/Gait  Yes    Ambulation/Gait Assistance  6: Modified independent (Device/Increase time)    Ambulation Distance (Feet)  --   around clinic for various activities   Assistive device  None    Gait Pattern  Step-through pattern;Decreased arm swing - left;Decreased step length - left;Decreased dorsiflexion - left;Decreased weight shift to left;Decreased trunk rotation;Poor foot clearance - left;Left steppage    Gait Comments  can hear foot slap on L during gait      Exercises   Exercises  Knee/Hip      Knee/Hip Exercises: Aerobic  Elliptical  2 minutes forward and 1 minute backward level 1.0  with rpm>45 and cues for equal weight bearing/use of LEs      Knee/Hip Exercises: Standing   SLS  x 5 reps each side.  Pt unable to maintain > 2 seconds on LLE.  able to maintain >20 sec on RLE    Other Standing Knee Exercises  in // bars for 15 reps x 2 sets of hip abd, hip extension and hip flexion with green theraband.  Standing with 3# weight behind knee  with knee held in flexion and performed 15 reps x 2 sets of hip extension then terminal hip extension with 2" push back.  Performed on bil sides.    Other Standing Knee Exercises  Taps to 6" step alternating LE's then 6", 12", 6" floor with only occasional UE support today x 20 reps each      Knee/Hip Exercises: Supine   Bridges  Both;10 reps    Bridges with Clamshell  Both;10 reps    Other Supine Knee/Hip Exercises  bridge x 10 with  march bil sides    Other Supine Knee/Hip Exercises  hip abd/add with green theraband bil sides then single leg x 10 reps each             PT Education - 12/22/19 1605    Education Details  Requesting additional visits per Mady Haagensen, PT. Scheduling additional appointments    Person(s) Educated  Patient    Methods  Explanation    Comprehension  Verbalized understanding       PT Short Term Goals - 12/22/19 1606      PT SHORT TERM GOAL #1   Title  Patient will be independent with progression of HEP for LLE strengthening, stretching, and balance/gait.  (Target for all STGs 12/22/2019)    Baseline  need to continue to progress HEP and revise as pt's strength/balance improve    Time  3    Period  Weeks    Status  On-going      PT SHORT TERM GOAL #2   Title  Pt will perform 10 reps sit<>stand with no UE support, BLES in equal weightbearing position for improved functional strength and use of LLE.    Time  3    Period  Weeks    Status  Achieved      PT SHORT TERM GOAL #3   Title  Pt will improve single limb stance to at least 2 seconds on LLE for improved functional LLE strength.    Baseline  1.59 sec at best on LLE on 12/22/19.  >20 sec on RLE    Time  3    Period  Weeks    Status  Not Met        PT Long Term Goals - 11/29/19 1606      PT LONG TERM GOAL #1   Title  Patient will be independent with progression of HEP (Target for all LTGs:  01/26/2020)    Baseline  HEP initiated and revised, 11/29/2019    Time  7    Period  Weeks    Status  Revised      PT LONG TERM GOAL #2   Title  Pt will improve DGI score to at least 19/24 for decreased fall risk.    Baseline  DGI 16/24; scores <19/24 indicate increased fall risk.    Time  7    Period  Weeks    Status  On-going  PT LONG TERM GOAL #3   Title  Pt will improve tandem stance with LLE in posterior position to at least 15 seconds demonstrating improved LLE strength.    Baseline  tandem stance with LLE in posterior  position 5.81 sec    Time  7    Period  Weeks    Status  On-going      PT LONG TERM GOAL #4   Title  Pt will negotiate at least 4 steps with bilateral handrails, step through pattern, modified independently.    Baseline  step to pattern with bilateral rails, RLE leading up and RLE leading down    Time  7    Period  Weeks    Status  On-going            Plan - 12/22/19 1608    Clinical Impression Statement  STG's checked today.  Pt met STG 2, STG 1 ongoing and STG 3 not met as pt continues with decreased stance on LLE.  Pt continues to be motivated to improve mobility as much as possible along with balance and strength.  Request additional visits per Mady Haagensen, PT.    Personal Factors and Comorbidities  Comorbidity 3+;Behavior Pattern    Comorbidities  HX of bipolar, cellulits BLEs, rhabdomyolysis, ETOH and polysubstance abuse, L arm surgery due to compartment syndrome    Examination-Activity Limitations  Locomotion Level;Squat;Stairs;Stand    Examination-Participation Restrictions  Community Activity    Stability/Clinical Decision Making  Evolving/Moderate complexity    Rehab Potential  Good    PT Frequency  Other (comment)   1x/wk for 3 weeks, then 1x/wk for 4 weeks, per recert 0/11/69   PT Duration  Other (comment)   7 weeks total POC   PT Treatment/Interventions  ADLs/Self Care Home Management;Gait training;DME Instruction;Electrical Stimulation;Stair training;Functional mobility training;Therapeutic activities;Therapeutic exercise;Balance training;Neuromuscular re-education;Patient/family education;Orthotic Fit/Training    PT Next Visit Plan  Request additional visits per [facility], PT, for 4 additional weeks.    Consulted and Agree with Plan of Care  Patient       Patient will benefit from skilled therapeutic intervention in order to improve the following deficits and impairments:  Abnormal gait, Decreased balance, Decreased mobility, Difficulty walking, Decreased  range of motion, Decreased strength, Impaired sensation, Impaired flexibility  Visit Diagnosis: Muscle weakness (generalized)  Other abnormalities of gait and mobility  Unsteadiness on feet     Problem List Patient Active Problem List   Diagnosis Date Noted  . Smoker 05/06/2016  . Bipolar disorder ([place]) 05/06/2016   Narda Bonds, PTA [place] 12/22/19 4:11 PM Phone: [phone] Fax: [facility] [address] [place], [place], [address] Phone: [phone]   Fax:  [phone]  Name: [name] MRN: [id number] Date of Birth: Jun 11, 1971

## 2020-01-05 ENCOUNTER — Other Ambulatory Visit: Payer: Self-pay

## 2020-01-05 ENCOUNTER — Ambulatory Visit: Payer: Medicaid Other | Admitting: Physical Therapy

## 2020-01-05 ENCOUNTER — Ambulatory Visit: Payer: Medicaid Other | Attending: Family Medicine | Admitting: Occupational Therapy

## 2020-01-05 ENCOUNTER — Encounter: Payer: Self-pay | Admitting: Physical Therapy

## 2020-01-05 DIAGNOSIS — M25642 Stiffness of left hand, not elsewhere classified: Secondary | ICD-10-CM | POA: Diagnosis not present

## 2020-01-05 DIAGNOSIS — R29818 Other symptoms and signs involving the nervous system: Secondary | ICD-10-CM | POA: Insufficient documentation

## 2020-01-05 DIAGNOSIS — R278 Other lack of coordination: Secondary | ICD-10-CM

## 2020-01-05 DIAGNOSIS — R2689 Other abnormalities of gait and mobility: Secondary | ICD-10-CM | POA: Insufficient documentation

## 2020-01-05 DIAGNOSIS — R209 Unspecified disturbances of skin sensation: Secondary | ICD-10-CM | POA: Insufficient documentation

## 2020-01-05 DIAGNOSIS — M6281 Muscle weakness (generalized): Secondary | ICD-10-CM

## 2020-01-05 DIAGNOSIS — M25632 Stiffness of left wrist, not elsewhere classified: Secondary | ICD-10-CM | POA: Diagnosis present

## 2020-01-05 DIAGNOSIS — R2681 Unsteadiness on feet: Secondary | ICD-10-CM | POA: Insufficient documentation

## 2020-01-05 DIAGNOSIS — R208 Other disturbances of skin sensation: Secondary | ICD-10-CM

## 2020-01-05 NOTE — Therapy (Signed)
[facility] [address] [place], [place], [address] Phone: [phone]   Fax:  [phone]  Occupational Therapy Treatment  Patient Details  Name: [name] MRN: [id number] Date of Birth: [birth year] Referring Provider (OT): Dr. Horald Pollen   Encounter Date: 01/05/2020  OT End of Session - 01/05/20 1516    Visit Number  2    Number of Visits  9    Date for OT Re-Evaluation  02/18/20    Authorization Type  Medicaid--    Authorization Time Period  8 visits through 02/29/20    Authorization - Visit Number  1    Authorization - Number of Visits  8    OT Start Time  [id number]    OT Stop Time  1526    OT Time Calculation (min)  38 min    Activity Tolerance  Patient tolerated treatment well    Behavior During Therapy  [facility] for tasks assessed/performed       Past Medical History:  Diagnosis Date  . Bipolar 1 disorder ([place])    followed by psychiatry  . Chronic kidney disease    hx of short term dialysis  . 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 ETOH abuse   . Foot drop, left   . Foot drop, left foot   . Polysubstance abuse ([place])    history of  . Smoker   . Swallowing difficulty    cannot swallow pills  . Teeth decayed   . Vitamin D deficiency     Past Surgical History:  Procedure Laterality Date  . arm surgery Left    compartment syndrome surgeries  . CESAREAN SECTION     fetal distress  . HAND SURGERY     for fracture  . TONSILLECTOMY    . TOOTH EXTRACTION Bilateral 10/26/2018   Procedure: MULTIPLE EXTRACTION # 18, 21, 22, 27, 28;  Surgeon: Diona Browner, DDS;  Location: [facility];  Service: Oral Surgery;  Laterality: Bilateral;    There were no vitals filed for this visit.  Subjective Assessment - 01/05/20 1451    Patient Stated Goals  I would like to be able to use my hand correctly, hold things    Currently in Pain?  No/denies            Treatment: Placing graded  clothespins on vertical antennae with LUE for sustained pinch and functional reach, min v.c Copying small peg design with LUE for increased fine motor coordination, then removing with in hand manipulation, min v.c to avoid compensatory shoulder hiking                OT Education - 01/05/20 1458    Education Details  red putty , coordination HEP   Person(s) Educated  Patient    Methods  Explanation demonstration   Comprehension  Verbalized understanding;Returned demonstration       OT Short Term Goals - 12/20/19 1519      OT SHORT TERM GOAL #1   Title  Pt will be independent with HEP for incr coordination.--check STGs 01/20/20    Baseline  dependent    Time  4    Period  Weeks    Status  New      OT SHORT TERM GOAL #2   Title  Pt will incr L grip strength by at least 5lbs for incr ease for grasping/lifting objects.    Baseline  32.8lbs    Time  4  Period  Weeks    Status  New      OT SHORT TERM GOAL #3   Title  Pt will improve lateral and 3point pinch strength by at least 2lbs each for incr ease with ADLs.    Baseline  lateral-8lbs, 3point 4lbs    Time  4    Period  Weeks    Status  New      OT SHORT TERM GOAL #4   Title  Pt wil improve L hand coordination for ADLs as shown by improving time on 9-hole peg test by at least 8sec.    Baseline  32.18sec    Time  4    Period  Weeks    Status  New      OT SHORT TERM GOAL #5   Title  ---        OT Long Term Goals - 12/20/19 1522      OT LONG TERM GOAL #1   Title  Pt will be independent with strengthening HEP.--check LTGs 02/17/20    Baseline  dependent    Time  8    Period  Weeks    Status  New      OT LONG TERM GOAL #2   Title  Pt improve L hand grip strength by at least 10lbs to assist in lifting/gripping objects.    Baseline  32.8lbs    Time  8    Period  Weeks    Status  New      OT LONG TERM GOAL #3   Title  Pt will be able to retrieve 3lb object from overhead shelf safely with LUE.     Baseline  unable    Time  8    Period  Weeks    Status  New      OT LONG TERM GOAL #4   Title  Pt will report use LUE as nondominant assist at least 90% of the time for ADLs/IADLs.    Baseline  pt reports that she typically does tasks with RUE only.    Time  8    Period  Weeks    Status  New      OT LONG TERM GOAL #5   Title  --            Plan - 01/05/20 1518    Clinical Impression Statement  Pt is progressing towards goals with improving fine motor coordination. Pt demonstrates understanding of inital HEP.    OT Occupational Profile and History  Detailed Assessment- Review of Records and additional review of physical, cognitive, psychosocial history related to current functional performance    Occupational performance deficits (Please refer to evaluation for details):  ADL's;IADL's    Body Structure / Function / Physical Skills  ADL;Dexterity;ROM;IADL;Balance;Sensation;Strength;FMC;Coordination;UE functional use    Rehab Potential  Good    Clinical Decision Making  Several treatment options, min-mod task modification necessary    Comorbidities Affecting Occupational Performance:  May have comorbidities impacting occupational performance    Modification or Assistance to Complete Evaluation   Min-Moderate modification of tasks or assist with assess necessary to complete eval    OT Frequency  1x / week    OT Duration  8 weeks   +eval   OT Treatment/Interventions  Self-care/ADL training;Moist Heat;Fluidtherapy;DME and/or AE instruction;Splinting;Therapeutic activities;Aquatic Therapy;Ultrasound;Therapeutic exercise;Scar mobilization;Passive range of motion;Neuromuscular education;Cryotherapy;Electrical Stimulation;Paraffin;Manual Therapy;Patient/family education    Plan  continue to address functional use of LUE, work towards goals    Consulted and Agree  with Plan of Care  Patient       Patient will benefit from skilled therapeutic intervention in order to improve the following  deficits and impairments:   Body Structure / Function / Physical Skills: ADL, Dexterity, ROM, IADL, Balance, Sensation, Strength, FMC, Coordination, UE functional use       Visit Diagnosis: Stiffness of left hand, not elsewhere classified  Stiffness of left wrist, not elsewhere classified  Other lack of coordination  Muscle weakness (generalized)  Other disturbances of skin sensation    Problem List Patient Active Problem List   Diagnosis Date Noted  . Smoker 05/06/2016  . Bipolar disorder ([place]) 05/06/2016    Willine Schwalbe 01/05/2020, 3:25 PM  [facility] [address] [place], [place], [address] Phone: [phone]   Fax:  [phone]  Name: [name] MRN: [id number] Date of Birth: 09/01/1971

## 2020-01-05 NOTE — Patient Instructions (Signed)
1. Grip Strengthening (Resistive Putty)   Squeeze putty using thumb and all fingers. Repeat _20___ times. Do __2__ sessions per day.   2. Roll putty into tube on table and pinch between each finger and thumb x 10 reps each. (can do ring and small finger together)     Copyright  VHI. All rights reserved.     Coordination Activities  Perform the following activities for 20 minutes 1 times per day with left hand(s).   Rotate ball in fingertips (clockwise and counter-clockwise).  Toss ball between hands.  Flip cards 1 at a time as fast as you can.  Deal cards with your thumb (Hold deck in hand and push card off top with thumb).  Pick up coins and place in container or coin bank.  Pick up coins and stack.  Pick up coins one at a time until you get 5-10 in your hand, then move coins from palm to fingertips to stack one at a time.

## 2020-01-05 NOTE — Patient Instructions (Signed)
Access Code: [email or web address]  URL: [email or web address]  Date: 01/05/2020  Prepared by: Nita Sells   Exercises  Standing Ankle Dorsiflexion Stretch - 3 reps - 1 sets - 30 second hold - 1x daily - 7x weekly  Bent Knee Fallouts - 10 reps - 1 sets - 1x daily - 7x weekly  Marching Bridge - 10 reps - 1 sets - 1x daily - 7x weekly  Clamshell with Resistance - 10 reps - 1 sets - 3 seconds hold - 1x daily - 7x weekly  Sidelying Hip Abduction with Resistance at Ankle - 10 reps - 1 sets - 1x daily - 7x weekly  Step Up - 10 reps - 1 sets - 1x daily - 7x weekly  Lateral Step Ups - 10 reps - 1 sets - 1x daily - 7x weekly  Tandem Stance with Chair Support - 4 reps - 1 sets - 10 seconds hold - 1x daily - 7x weekly  Walking Tandem Stance - 1 sets - 1x daily - 7x weekly  Single Leg Stance - 3 reps - 1 sets - 10 seconds hold - 1x daily - 7x weekly  Carioca with Counter Support - 3 reps - 1 sets - 1x daily - 7x weekly  Hip Extension with Resistance Loop - 10 reps - 1 sets - 1x daily - 7x weekly  Hip Abduction with Resistance Loop - 10 reps - 1 sets - 1x daily - 7x weekly  Side Stepping with Resistance at Ankles - 10 reps - 1 sets - 1x daily - 7x weekly  Sit to Stand in Stride with AFO and UE Assist in LE Alignment - 10 reps - 1 sets - 1x daily - 7x weekly

## 2020-01-05 NOTE — Therapy (Signed)
[facility] [address] [place], [place], [address] Phone: [phone]   Fax:  [phone]  Physical Therapy Treatment  Patient Details  Name: [name] MRN: [id number] Date of Birth: 06/07/1971 Referring Provider (PT): Horald Pollen   Encounter Date: 01/05/2020  PT End of Session - 01/05/20 1611    Visit Number  9    Number of Visits  12    Date for PT Re-Evaluation  44/03/47   recert 02/23/5955   Authorization Type  4 additional visits approved 2/12-3/11/21    Authorization - Visit Number  1    Authorization - Number of Visits  4    PT Start Time  1410   pt arrived late   PT Stop Time  1445    PT Time Calculation (min)  35 min    Activity Tolerance  Patient tolerated treatment well    Behavior During Therapy  [facility] for tasks assessed/performed       Past Medical History:  Diagnosis Date  . Bipolar 1 disorder ([place])    followed by psychiatry  . Chronic kidney disease    hx of short term dialysis  . 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 ETOH abuse   . Foot drop, left   . Foot drop, left foot   . Polysubstance abuse ([place])    history of  . Smoker   . Swallowing difficulty    cannot swallow pills  . Teeth decayed   . Vitamin D deficiency     Past Surgical History:  Procedure Laterality Date  . arm surgery Left    compartment syndrome surgeries  . CESAREAN SECTION     fetal distress  . HAND SURGERY     for fracture  . TONSILLECTOMY    . TOOTH EXTRACTION Bilateral 10/26/2018   Procedure: MULTIPLE EXTRACTION # 18, 21, 22, 27, 28;  Surgeon: Diona Browner, DDS;  Location: [facility];  Service: Oral Surgery;  Laterality: Bilateral;    There were no vitals filed for this visit.  Subjective Assessment - 01/05/20 1415    Subjective  Pt running late today.  Denies any falls or changes. Brought her previous exercise programs for [place] to review.    Currently in Pain?  No/denies         [facility] Adult PT Treatment/Exercise - 01/05/20 0001      Knee/Hip Exercises: Aerobic   Elliptical  2 minutes forward and 1 minute backward level 1.0  with rpm>45 and cues for equal weight bearing/use of LEs    Other Aerobic  [place]ifit level 3.0 all 4 extremities x 6 minutes with rpm?50             PT Education - 01/05/20 1611    Education Details  Revised HEP    Person(s) Educated  Patient    Methods  Explanation;Demonstration    Comprehension  Verbalized understanding;Returned demonstration       PT Short Term Goals - 12/22/19 1606      PT SHORT TERM GOAL #1   Title  Patient will be independent with progression of HEP for LLE strengthening, stretching, and balance/gait.  (Target for all STGs 12/22/2019)    Baseline  need to continue to progress HEP and revise as pt's strength/balance improve    Time  3    Period  Weeks    Status  On-going      PT SHORT TERM GOAL #2   Title  Pt will perform 10 reps sit<>stand with no UE support, BLES in equal weightbearing position for improved functional strength and use of LLE.    Time  3    Period  Weeks    Status  Achieved      PT SHORT TERM GOAL #3   Title  Pt will improve single limb stance to at least 2 seconds on LLE for improved functional LLE strength.    Baseline  1.59 sec at best on LLE on 12/22/19.  >20 sec on RLE    Time  3    Period  Weeks    Status  Not Met        PT Long Term Goals - 11/29/19 1606      PT LONG TERM GOAL #1   Title  Patient will be independent with progression of HEP (Target for all LTGs:  01/26/2020)    Baseline  HEP initiated and revised, 11/29/2019    Time  7    Period  Weeks    Status  Revised      PT LONG TERM GOAL #2   Title  Pt will improve DGI score to at least 19/24 for decreased fall risk.    Baseline  DGI 16/24; scores <19/24 indicate increased fall risk.    Time  7    Period  Weeks    Status  On-going      PT LONG TERM GOAL #3   Title  Pt will improve tandem stance with LLE in  posterior position to at least 15 seconds demonstrating improved LLE strength.    Baseline  tandem stance with LLE in posterior position 5.81 sec    Time  7    Period  Weeks    Status  On-going      PT LONG TERM GOAL #4   Title  Pt will negotiate at least 4 steps with bilateral handrails, step through pattern, modified independently.    Baseline  step to pattern with bilateral rails, RLE leading up and RLE leading down    Time  7    Period  Weeks    Status  On-going            Plan - 01/05/20 1612    Clinical Impression Statement  Treatment session focused on strengthening and condensing HEP.  Pt continues to be motivated to increase strength.  Continue PT per POC.    Personal Factors and Comorbidities  Comorbidity 3+;Behavior Pattern    Comorbidities  HX of bipolar, cellulits BLEs, rhabdomyolysis, ETOH and polysubstance abuse, L arm surgery due to compartment syndrome    Examination-Activity Limitations  Locomotion Level;Squat;Stairs;Stand    Examination-Participation Restrictions  Community Activity    Stability/Clinical Decision Making  Evolving/Moderate complexity    Rehab Potential  Good    PT Frequency  Other (comment)   1x/wk for 3 weeks, then 1x/wk for 4 weeks, per recert 12/01/3788   PT Duration  Other (comment)   7 weeks total POC   PT Treatment/Interventions  ADLs/Self Care Home Management;Gait training;DME Instruction;Electrical Stimulation;Stair training;Functional mobility training;Therapeutic activities;Therapeutic exercise;Balance training;Neuromuscular re-education;Patient/family education;Orthotic Fit/Training    PT Next Visit Plan  Provide handout for revised HEP.  Continue balance and strengthening and endurace.    PT Home Exercise Plan  Access Code: [email or web address] URL: [email or web address] Date: 01/05/2020 Prepared by: Nita Sells    Consulted and Agree with Plan of Care  Patient       Patient will benefit from skilled therapeutic intervention  in  order to improve the following deficits and impairments:  Abnormal gait, Decreased balance, Decreased mobility, Difficulty walking, Decreased range of motion, Decreased strength, Impaired sensation, Impaired flexibility  Visit Diagnosis: Muscle weakness (generalized)  Other abnormalities of gait and mobility  Unsteadiness on feet     Problem List Patient Active Problem List   Diagnosis Date Noted  . Smoker 05/06/2016  . Bipolar disorder ([place]) 05/06/2016    Narda Bonds, PTA [facility] 01/05/20 4:17 PM Phone: [phone] Fax: [facility] [address] [place], [place], [address] Phone: [phone]   Fax:  [phone]  Name: [name] MRN: [id number] Date of Birth: [birth year]

## 2020-01-10 ENCOUNTER — Other Ambulatory Visit: Payer: Self-pay

## 2020-01-10 ENCOUNTER — Ambulatory Visit: Payer: Medicaid Other | Admitting: Occupational Therapy

## 2020-01-10 ENCOUNTER — Encounter: Payer: Self-pay | Admitting: Occupational Therapy

## 2020-01-10 DIAGNOSIS — M25632 Stiffness of left wrist, not elsewhere classified: Secondary | ICD-10-CM

## 2020-01-10 DIAGNOSIS — M25642 Stiffness of left hand, not elsewhere classified: Secondary | ICD-10-CM | POA: Diagnosis not present

## 2020-01-10 DIAGNOSIS — R278 Other lack of coordination: Secondary | ICD-10-CM

## 2020-01-10 DIAGNOSIS — M6281 Muscle weakness (generalized): Secondary | ICD-10-CM

## 2020-01-10 DIAGNOSIS — R29818 Other symptoms and signs involving the nervous system: Secondary | ICD-10-CM

## 2020-01-10 DIAGNOSIS — R208 Other disturbances of skin sensation: Secondary | ICD-10-CM

## 2020-01-10 NOTE — Patient Instructions (Signed)
  Front Chop    Hold a paper towel roll, shoebox, or ball with arms straight. Slowly move arms up and down keeping elbows straight and shoulders away from ears. Repeat 15  times per set. Do 1 sets per session.  All Fours Shoulder Flexion / Extension    Place hands and knees shoulder-width apart, rock back and sit on legs, then rock forward over hands and forearms while looking up. Repeat 10 times or for 10 sec  Do 1 sessions per day.  Weight Bearing Hand Sit    Place hands flat at sides. Lean body weight to left side (can reach with the right hand keeping elbow straight and shoulder in alignment). Hold 10 seconds. Repeat 10 times. Do 1 sessions per day.   Strengthening: Resisted Flexion   Attach tube to door.  Hold tubing with one arm at side. Pull forward and up with elbow straight. Move shoulder through pain-free range of motion, no further than shoulder height. Repeat 15 times per set.  Do 1 sessions per day.    Strengthening: Resisted Extension   Attach one end to door.  Hold tubing in one hand, arm forward. Pull arm back, elbow straight. Repeat 15 times per set. Do 1 sessions per day.   Resisted Horizontal Abduction: Bilateral   Sit or stand, tubing in both hands, palms down and arms out in front. Keeping arms straight, pinch shoulder blades together and stretch arms out. Repeat 15 times per set.  Do 1 sessions per day.

## 2020-01-10 NOTE — Therapy (Signed)
[facility] [address] [place], [place], [address] Phone: [phone]   Fax:  [phone]  Occupational Therapy Treatment  Patient Details  Name: [name] MRN: [id number] Date of Birth: 1971-03-13 Referring Provider (OT): Dr. Horald Pollen   Encounter Date: 01/10/2020  OT End of Session - 01/10/20 0911    Visit Number  3    Number of Visits  9    Date for OT Re-Evaluation  02/18/20    Authorization Type  Medicaid    Authorization Time Period  8 visits through 02/29/20    Authorization - Visit Number  2    Authorization - Number of Visits  8    OT Start Time  [phone]    OT Stop Time  0930    OT Time Calculation (min)  39 min    Activity Tolerance  Patient tolerated treatment well    Behavior During Therapy  [facility] for tasks assessed/performed       Past Medical History:  Diagnosis Date  . Bipolar 1 disorder ([place])    followed by psychiatry  . Chronic kidney disease    hx of short term dialysis  . 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 ETOH abuse   . Foot drop, left   . Foot drop, left foot   . Polysubstance abuse ([place])    history of  . Smoker   . Swallowing difficulty    cannot swallow pills  . Teeth decayed   . Vitamin D deficiency     Past Surgical History:  Procedure Laterality Date  . arm surgery Left    compartment syndrome surgeries  . CESAREAN SECTION     fetal distress  . HAND SURGERY     for fracture  . TONSILLECTOMY    . TOOTH EXTRACTION Bilateral 10/26/2018   Procedure: MULTIPLE EXTRACTION # 18, 21, 22, 27, 28;  Surgeon: Diona Browner, DDS;  Location: [facility];  Service: Oral Surgery;  Laterality: Bilateral;    There were no vitals filed for this visit.  Subjective Assessment - 01/10/20 0852    Patient Stated Goals  I would like to be able to use my hand correctly, hold things    Currently in Pain?  No/denies         Picking up blocks with gripper set  on level 2 (black spring) for sustained grip strength with min difficulty.  Placing grooved pegs in pegboard with in-hand manipulation with min-mod difficulty, min v.c. for shoulder compensation.  Sliding cards across table using finger ext with mod difficulty.   Flipping cards with min difficulty.  Functional reaching to place/remove clothespins with 1-8lb resistance on vertical pole for incr pinch strength and LUE functional use.       OT Education - 01/10/20 0909    Education Details  HEP for proximal strength stability LUE-see pt instructions    Person(s) Educated  Patient    Methods  Explanation;Demonstration;Verbal cues;Handout    Comprehension  Verbalized understanding;Returned demonstration;Verbal cues required       OT Short Term Goals - 12/20/19 1519      OT SHORT TERM GOAL #1   Title  Pt will be independent with HEP for incr coordination.--check STGs 01/20/20    Baseline  dependent    Time  4    Period  Weeks    Status  New      OT SHORT TERM GOAL #2   Title  Pt  will incr L grip strength by at least 5lbs for incr ease for grasping/lifting objects.    Baseline  32.8lbs    Time  4    Period  Weeks    Status  New      OT SHORT TERM GOAL #3   Title  Pt will improve lateral and 3point pinch strength by at least 2lbs each for incr ease with ADLs.    Baseline  lateral-8lbs, 3point 4lbs    Time  4    Period  Weeks    Status  New      OT SHORT TERM GOAL #4   Title  Pt wil improve L hand coordination for ADLs as shown by improving time on 9-hole peg test by at least 8sec.    Baseline  32.18sec    Time  4    Period  Weeks    Status  New      OT SHORT TERM GOAL #5   Title  ---        OT Long Term Goals - 12/20/19 1522      OT LONG TERM GOAL #1   Title  Pt will be independent with strengthening HEP.--check LTGs 02/17/20    Baseline  dependent    Time  8    Period  Weeks    Status  New      OT LONG TERM GOAL #2   Title  Pt improve L hand grip strength  by at least 10lbs to assist in lifting/gripping objects.    Baseline  32.8lbs    Time  8    Period  Weeks    Status  New      OT LONG TERM GOAL #3   Title  Pt will be able to retrieve 3lb object from overhead shelf safely with LUE.    Baseline  unable    Time  8    Period  Weeks    Status  New      OT LONG TERM GOAL #4   Title  Pt will report use LUE as nondominant assist at least 90% of the time for ADLs/IADLs.    Baseline  pt reports that she typically does tasks with RUE only.    Time  8    Period  Weeks    Status  New      OT LONG TERM GOAL #5   Title  --            Plan - 01/10/20 0912    Clinical Impression Statement  Pt is progressing towards goals with improving stength and coordination.    OT Occupational Profile and History  Detailed Assessment- Review of Records and additional review of physical, cognitive, psychosocial history related to current functional performance    Occupational performance deficits (Please refer to evaluation for details):  ADL's;IADL's    Body Structure / Function / Physical Skills  ADL;Dexterity;ROM;IADL;Balance;Sensation;Strength;FMC;Coordination;UE functional use    Rehab Potential  Good    Clinical Decision Making  Several treatment options, min-mod task modification necessary    Comorbidities Affecting Occupational Performance:  May have comorbidities impacting occupational performance    Modification or Assistance to Complete Evaluation   Min-Moderate modification of tasks or assist with assess necessary to complete eval    OT Frequency  1x / week    OT Duration  8 weeks   +eval   OT Treatment/Interventions  Self-care/ADL training;Moist Heat;Fluidtherapy;DME and/or AE instruction;Splinting;Therapeutic activities;Aquatic Therapy;Ultrasound;Therapeutic exercise;Scar mobilization;Passive range of motion;Neuromuscular education;Cryotherapy;Dealer  Stimulation;Paraffin;Manual Therapy;Patient/family education    Plan  continue to  address functional use of LUE, work towards goals    Consulted and Agree with Plan of Care  Patient       Patient will benefit from skilled therapeutic intervention in order to improve the following deficits and impairments:   Body Structure / Function / Physical Skills: ADL, Dexterity, ROM, IADL, Balance, Sensation, Strength, FMC, Coordination, UE functional use       Visit Diagnosis: Muscle weakness (generalized)  Other lack of coordination  Other disturbances of skin sensation  Other symptoms and signs involving the nervous system  Stiffness of left wrist, not elsewhere classified  Stiffness of left hand, not elsewhere classified    Problem List Patient Active Problem List   Diagnosis Date Noted  . Smoker 05/06/2016  . Bipolar disorder ([place]) 05/06/2016    [facility] 01/10/2020, 9:21 AM  [facility] [address] [place], [place], [address] Phone: [phone]   Fax:  [phone]  Name: KIZZY HURTIG MRN: [id number] Date of Birth: 1971-09-24   Vianne Bulls, OTR/L [facility] [address] [place], [place]  [address] [phone] phone [phone] 01/10/20 9:21 AM

## 2020-01-12 ENCOUNTER — Encounter: Payer: Self-pay | Admitting: Physical Therapy

## 2020-01-12 ENCOUNTER — Ambulatory Visit: Payer: Medicaid Other | Admitting: Physical Therapy

## 2020-01-12 ENCOUNTER — Other Ambulatory Visit: Payer: Self-pay

## 2020-01-12 DIAGNOSIS — M6281 Muscle weakness (generalized): Secondary | ICD-10-CM

## 2020-01-12 DIAGNOSIS — R2689 Other abnormalities of gait and mobility: Secondary | ICD-10-CM

## 2020-01-12 DIAGNOSIS — R2681 Unsteadiness on feet: Secondary | ICD-10-CM

## 2020-01-12 DIAGNOSIS — M25642 Stiffness of left hand, not elsewhere classified: Secondary | ICD-10-CM | POA: Diagnosis not present

## 2020-01-12 NOTE — Therapy (Signed)
[facility] [address] [place], [place], [address] Phone: [phone]   Fax:  [phone]  Physical Therapy Treatment  Patient Details  Name: [name] MRN: [id number] Date of Birth: 10-18-1971 Referring Provider (PT): Horald Pollen   Encounter Date: 01/12/2020  PT End of Session - 01/12/20 1523    Visit Number  10    Number of Visits  12    Date for PT Re-Evaluation  07/37/10   recert 04/25/6947   Authorization Type  4 additional visits approved 2/12-3/11/21    Authorization - Visit Number  2    Authorization - Number of Visits  4    PT Start Time  1318    PT Stop Time  1400    PT Time Calculation (min)  42 min    Activity Tolerance  Patient tolerated treatment well    Behavior During Therapy  [facility] for tasks assessed/performed       Past Medical History:  Diagnosis Date  . Bipolar 1 disorder ([place])    followed by psychiatry  . Chronic kidney disease    hx of short term dialysis  . 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 ETOH abuse   . Foot drop, left   . Foot drop, left foot   . Polysubstance abuse ([place])    history of  . Smoker   . Swallowing difficulty    cannot swallow pills  . Teeth decayed   . Vitamin D deficiency     Past Surgical History:  Procedure Laterality Date  . arm surgery Left    compartment syndrome surgeries  . CESAREAN SECTION     fetal distress  . HAND SURGERY     for fracture  . TONSILLECTOMY    . TOOTH EXTRACTION Bilateral 10/26/2018   Procedure: MULTIPLE EXTRACTION # 18, 21, 22, 27, 28;  Surgeon: Diona Browner, DDS;  Location: [facility];  Service: Oral Surgery;  Laterality: Bilateral;    There were no vitals filed for this visit.  Subjective Assessment - 01/12/20 1321    Subjective  Denies falls or changes.  Has an appointment with [facility] about foot up brace.  states that Medicaid gave her their number but she's not sure that they  have a foot up brace.    Currently in Pain?  No/denies                       [facility] Adult PT Treatment/Exercise - 01/12/20 0001      Transfers   Transfers  Sit to Stand;Stand to Sit    Sit to Stand  6: Modified independent (Device/Increase time)    Number of Reps  10 reps;2 sets   with RLE on bubble to increase LLE weight bearing     Ambulation/Gait   Ambulation/Gait  Yes    Ambulation/Gait Assistance  5: Supervision    Ambulation Distance (Feet)  --   8 minutes on treadmill at 1.8-2.2 mph with incline 0-5%   Assistive device  Other (Comment)   treadmill   Gait Pattern  Step-through pattern;Decreased arm swing - left;Decreased step length - left;Decreased dorsiflexion - left;Decreased weight shift to left;Decreased trunk rotation;Poor foot clearance - left;Left steppage    Ambulation Surface  Other (comment)   treadmill with incline   Gait Comments  worked on treadmill for endurance and gait.  Pt continues to have decreased foot clearance on L and rotates at  hips to advance/clear L foot.  As speed increased, pt with increased episodes of L foot catching.        Knee/Hip Exercises: Aerobic   Elliptical  2 minutes forward and 1 minute backward level 2.0  with rpm>45 and cues for equal weight bearing/use of LEs  Needed brief rest break between forward and backwards      Knee/Hip Exercises: Machines for Strengthening   Other Machine  Leg press bil 60# x 15 then single leg 45# x 15 each      Knee/Hip Exercises: Standing   Forward Step Up  Both;1 set;15 reps;Other (comment);Step Height: 6"   intermittent hand hold   Step Down  Both;1 set;15 reps;Hand Hold: 1    Other Standing Knee Exercises  Taps to 6" step alternating LE's then 6", 12", 6" floor with only occasional UE support today x 20 reps each               PT Short Term Goals - 12/22/19 1606      PT SHORT TERM GOAL #1   Title  Patient will be independent with progression of HEP for LLE strengthening,  stretching, and balance/gait.  (Target for all STGs 12/22/2019)    Baseline  need to continue to progress HEP and revise as pt's strength/balance improve    Time  3    Period  Weeks    Status  On-going      PT SHORT TERM GOAL #2   Title  Pt will perform 10 reps sit<>stand with no UE support, BLES in equal weightbearing position for improved functional strength and use of LLE.    Time  3    Period  Weeks    Status  Achieved      PT SHORT TERM GOAL #3   Title  Pt will improve single limb stance to at least 2 seconds on LLE for improved functional LLE strength.    Baseline  1.59 sec at best on LLE on 12/22/19.  >20 sec on RLE    Time  3    Period  Weeks    Status  Not Met        PT Long Term Goals - 11/29/19 1606      PT LONG TERM GOAL #1   Title  Patient will be independent with progression of HEP (Target for all LTGs:  01/26/2020)    Baseline  HEP initiated and revised, 11/29/2019    Time  7    Period  Weeks    Status  Revised      PT LONG TERM GOAL #2   Title  Pt will improve DGI score to at least 19/24 for decreased fall risk.    Baseline  DGI 16/24; scores <19/24 indicate increased fall risk.    Time  7    Period  Weeks    Status  On-going      PT LONG TERM GOAL #3   Title  Pt will improve tandem stance with LLE in posterior position to at least 15 seconds demonstrating improved LLE strength.    Baseline  tandem stance with LLE in posterior position 5.81 sec    Time  7    Period  Weeks    Status  On-going      PT LONG TERM GOAL #4   Title  Pt will negotiate at least 4 steps with bilateral handrails, step through pattern, modified independently.    Baseline  step to pattern with bilateral rails,  RLE leading up and RLE leading down    Time  7    Period  Weeks    Status  On-going            Plan - 01/12/20 1524    Clinical Impression Statement  Skilled session focused on gait, endurance and LE strengthening.  Pt has appointment to get foot up brace for LLE.   Continues with decreased L foot clearance and gait deviations to compensate.  continue PT Per POC.    Personal Factors and Comorbidities  Comorbidity 3+;Behavior Pattern    Comorbidities  HX of bipolar, cellulits BLEs, rhabdomyolysis, ETOH and polysubstance abuse, L arm surgery due to compartment syndrome    Examination-Activity Limitations  Locomotion Level;Squat;Stairs;Stand    Examination-Participation Restrictions  Community Activity    Stability/Clinical Decision Making  Evolving/Moderate complexity    Rehab Potential  Good    PT Frequency  Other (comment)   1x/wk for 3 weeks, then 1x/wk for 4 weeks, per recert 04/27/353   PT Duration  Other (comment)   7 weeks total POC   PT Treatment/Interventions  ADLs/Self Care Home Management;Gait training;DME Instruction;Electrical Stimulation;Stair training;Functional mobility training;Therapeutic activities;Therapeutic exercise;Balance training;Neuromuscular re-education;Patient/family education;Orthotic Fit/Training    PT Next Visit Plan  ProvidContinue balance and strengthening and endurace.    PT Home Exercise Plan  Access Code: [email or web address] URL: [email or web address] Date: 01/05/2020 Prepared by: Nita Sells    Consulted and Agree with Plan of Care  Patient       Patient will benefit from skilled therapeutic intervention in order to improve the following deficits and impairments:  Abnormal gait, Decreased balance, Decreased mobility, Difficulty walking, Decreased range of motion, Decreased strength, Impaired sensation, Impaired flexibility  Visit Diagnosis: Muscle weakness (generalized)  Other abnormalities of gait and mobility  Unsteadiness on feet     Problem List Patient Active Problem List   Diagnosis Date Noted  . Smoker 05/06/2016  . Bipolar disorder ([place]) 05/06/2016    Narda Bonds, PTA [facility] 01/12/20 3:27 PM Phone: [phone] Fax: [facility] [address] [place], [place], [address] Phone: [phone]   Fax:  [phone]  Name: BLAIZE NIPPER MRN: [id number] Date of Birth: Apr 30, 1971

## 2020-01-17 ENCOUNTER — Encounter: Payer: Self-pay | Admitting: Occupational Therapy

## 2020-01-17 ENCOUNTER — Other Ambulatory Visit: Payer: Self-pay

## 2020-01-17 ENCOUNTER — Ambulatory Visit: Payer: Medicaid Other | Admitting: Occupational Therapy

## 2020-01-17 DIAGNOSIS — M25632 Stiffness of left wrist, not elsewhere classified: Secondary | ICD-10-CM

## 2020-01-17 DIAGNOSIS — M25642 Stiffness of left hand, not elsewhere classified: Secondary | ICD-10-CM

## 2020-01-17 DIAGNOSIS — R208 Other disturbances of skin sensation: Secondary | ICD-10-CM

## 2020-01-17 DIAGNOSIS — R29818 Other symptoms and signs involving the nervous system: Secondary | ICD-10-CM

## 2020-01-17 DIAGNOSIS — M6281 Muscle weakness (generalized): Secondary | ICD-10-CM

## 2020-01-17 DIAGNOSIS — R278 Other lack of coordination: Secondary | ICD-10-CM

## 2020-01-17 NOTE — Therapy (Signed)
[facility] [address] [place], [place], [address] Phone: [phone]   Fax:  [phone]  Occupational Therapy Treatment  Patient Details  Name: [name] MRN: [id number] Date of Birth: [birth year] Referring Provider (OT): Dr. Horald Pollen   Encounter Date: 01/17/2020  OT End of Session - 01/17/20 0855    Visit Number  4    Number of Visits  9    Date for OT Re-Evaluation  02/18/20    Authorization Type  Medicaid    Authorization Time Period  8 visits through 02/29/20    Authorization - Visit Number  3    Authorization - Number of Visits  8    OT Start Time  0850    OT Stop Time  0930    OT Time Calculation (min)  40 min    Activity Tolerance  Patient tolerated treatment well    Behavior During Therapy  [facility] for tasks assessed/performed       Past Medical History:  Diagnosis Date  . Bipolar 1 disorder ([place])    followed by psychiatry  . Chronic kidney disease    hx of short term dialysis  . 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 ETOH abuse   . Foot drop, left   . Foot drop, left foot   . Polysubstance abuse ([place])    history of  . Smoker   . Swallowing difficulty    cannot swallow pills  . Teeth decayed   . Vitamin D deficiency     Past Surgical History:  Procedure Laterality Date  . arm surgery Left    compartment syndrome surgeries  . CESAREAN SECTION     fetal distress  . HAND SURGERY     for fracture  . TONSILLECTOMY    . TOOTH EXTRACTION Bilateral 10/26/2018   Procedure: MULTIPLE EXTRACTION # 18, 21, 22, 27, 28;  Surgeon: Diona Browner, DDS;  Location: [facility];  Service: Oral Surgery;  Laterality: Bilateral;    There were no vitals filed for this visit.  Subjective Assessment - 01/17/20 0855    Subjective   exercises going well    Patient Stated Goals  I would like to be able to use my hand correctly, hold things    Currently in Pain?  No/denies          Functional reaching to place/remove clothespins with 1-8lb resistance on vertical pole for incr pinch strength, coordination, and activity tolerance.  Completing Purdue Pegboard for incr coordination with min difficulty.  Began checking STGs and discussed progress--see below for updates   Reviewed coordination HEP and pt returned demo each with min v.c. intermittently for shoulder hike/improved performance.  Placing grooved pegs in pegboard using in hand manipulation with min difficulty.  Rotating 2 golf balls in hand each way with min difficulty.       OT Short Term Goals - 01/17/20 0906      OT SHORT TERM GOAL #1   Title  Pt will be independent with HEP for incr coordination.--check STGs 01/20/20    Baseline  dependent    Time  4    Period  Weeks    Status  Achieved      OT SHORT TERM GOAL #2   Title  Pt will incr L grip strength by at least 5lbs for incr ease for grasping/lifting objects.    Baseline  32.8lbs    Time  4    Period  Weeks    Status  Achieved   39.6lbs     OT SHORT TERM GOAL #3   Title  Pt will improve lateral and 3point pinch strength by at least 2lbs each for incr ease with ADLs.    Baseline  lateral-8lbs, 3point 4lbs    Time  4    Period  Weeks    Status  On-going   01/17/20:  lateral-10lbs (met), 3point-5lb (improved by 1lb)     OT SHORT TERM GOAL #4   Title  Pt wil improve L hand coordination for ADLs as shown by improving time on 9-hole peg test by at least 8sec.    Baseline  32.18sec    Time  4    Period  Weeks    Status  On-going   01/17/20:  29.35sec     OT SHORT TERM GOAL #5   Title  ---        OT Long Term Goals - 12/20/19 1522      OT LONG TERM GOAL #1   Title  Pt will be independent with strengthening HEP.--check LTGs 02/17/20    Baseline  dependent    Time  8    Period  Weeks    Status  New      OT LONG TERM GOAL #2   Title  Pt improve L hand grip strength by at least 10lbs to assist in lifting/gripping objects.     Baseline  32.8lbs    Time  8    Period  Weeks    Status  New      OT LONG TERM GOAL #3   Title  Pt will be able to retrieve 3lb object from overhead shelf safely with LUE.    Baseline  unable    Time  8    Period  Weeks    Status  New      OT LONG TERM GOAL #4   Title  Pt will report use LUE as nondominant assist at least 90% of the time for ADLs/IADLs.    Baseline  pt reports that she typically does tasks with RUE only.    Time  8    Period  Weeks    Status  New      OT LONG TERM GOAL #5   Title  --            Plan - 01/17/20 0856    Clinical Impression Statement  Pt is progressing towards goals with improving stength and coordination.  Pt met 2/4 STGs, partially met 1 goal and is progressing toward remaining STG.  Pt continues to demo difficulty with in-hand manipulation and isolated finger movement.    OT Occupational Profile and History  Detailed Assessment- Review of Records and additional review of physical, cognitive, psychosocial history related to current functional performance    Occupational performance deficits (Please refer to evaluation for details):  ADL's;IADL's    Body Structure / Function / Physical Skills  ADL;Dexterity;ROM;IADL;Balance;Sensation;Strength;FMC;Coordination;UE functional use    Rehab Potential  Good    Clinical Decision Making  Several treatment options, min-mod task modification necessary    Comorbidities Affecting Occupational Performance:  May have comorbidities impacting occupational performance    Modification or Assistance to Complete Evaluation   Min-Moderate modification of tasks or assist with assess necessary to complete eval    OT Frequency  1x / week    OT Duration  8 weeks   +eval   OT Treatment/Interventions  Self-care/ADL training;Moist Heat;Fluidtherapy;DME  and/or AE instruction;Splinting;Therapeutic activities;Aquatic Therapy;Ultrasound;Therapeutic exercise;Scar mobilization;Passive range of motion;Neuromuscular  education;Cryotherapy;Electrical Stimulation;Paraffin;Manual Therapy;Patient/family education    Plan  continue to address functional use of LUE, review L shoulder HEP    Consulted and Agree with Plan of Care  Patient       Patient will benefit from skilled therapeutic intervention in order to improve the following deficits and impairments:   Body Structure / Function / Physical Skills: ADL, Dexterity, ROM, IADL, Balance, Sensation, Strength, FMC, Coordination, UE functional use       Visit Diagnosis: Muscle weakness (generalized)  Other lack of coordination  Other disturbances of skin sensation  Other symptoms and signs involving the nervous system  Stiffness of left wrist, not elsewhere classified  Stiffness of left hand, not elsewhere classified        Problem List Patient Active Problem List   Diagnosis Date Noted  . Smoker 05/06/2016  . Bipolar disorder ([place]) 05/06/2016    [facility] 01/17/2020, 9:35 AM  [facility] [address] [place], [place], [address] Phone: [phone]   Fax:  [phone]  Name: [name] MRN: [id number] Date of Birth: 11-08-1971   Vianne Bulls, OTR/L [facility] [address] [place], [place]  [address] [phone] phone [phone] 01/17/20 9:01 AM

## 2020-01-19 ENCOUNTER — Ambulatory Visit: Payer: Medicaid Other | Admitting: Physical Therapy

## 2020-01-19 ENCOUNTER — Other Ambulatory Visit: Payer: Self-pay

## 2020-01-19 DIAGNOSIS — M25642 Stiffness of left hand, not elsewhere classified: Secondary | ICD-10-CM | POA: Diagnosis not present

## 2020-01-19 DIAGNOSIS — R2689 Other abnormalities of gait and mobility: Secondary | ICD-10-CM

## 2020-01-19 DIAGNOSIS — M6281 Muscle weakness (generalized): Secondary | ICD-10-CM

## 2020-01-19 NOTE — Patient Instructions (Signed)
Access Code: [email or web address] URL: [email or web address] Date: 01/19/2020 Prepared by: Mady Haagensen  Exercises (increased step ups to single leg, up to 2 sets) Standing Ankle Dorsiflexion Stretch - 3 reps - 1 sets - 30 second hold - 1x daily - 7x weekly Bent Knee Fallouts - 10 reps - 1 sets - 1x daily - 7x weekly Marching Bridge - 10 reps - 1 sets - 1x daily - 7x weekly Clamshell with Resistance - 10 reps - 1 sets - 3 seconds hold - 1x daily - 7x weekly Sidelying Hip Abduction with Resistance at Ankle - 10 reps - 1 sets - 1x daily - 7x weekly Step Up - 10 reps - 2 sets - 1x daily - 7x weekly Lateral Step Ups - 10 reps - 2 sets - 1x daily - 7x weekly Tandem Stance with Chair Support - 4 reps - 1 sets - 10 seconds hold - 1x daily - 7x weekly Walking Tandem Stance - 1 sets - 1x daily - 7x weekly Single Leg Stance - 3 reps - 1 sets - 10 seconds hold - 1x daily - 7x weekly Carioca with Counter Support - 3 reps - 1 sets - 1x daily - 7x weekly Hip Extension with Resistance Loop - 10 reps - 1 sets - 1x daily - 7x weekly Hip Abduction with Resistance Loop - 10 reps - 1 sets - 1x daily - 7x weekly Side Stepping with Resistance at Ankles - 10 reps - 1 sets - 1x daily - 7x weekly Sit to Stand in Stride with AFO and UE Assist in LE Alignment - 10 reps - 1 sets - 1x daily - 7x weekly

## 2020-01-19 NOTE — Therapy (Signed)
[facility] [address] [place], [place], [address] Phone: [phone]   Fax:  [phone]  Physical Therapy Treatment  Patient Details  Name: [name] MRN: [id number] Date of Birth: Feb 25, 1971 Referring Provider (PT): Horald Pollen   Encounter Date: 01/19/2020  PT End of Session - 01/19/20 1427    Visit Number  11    Number of Visits  12    Date for PT Re-Evaluation  16/01/09   recert 01/23/3556   Authorization Type  4 additional visits approved 2/12-3/11/21    Authorization - Visit Number  3    Authorization - Number of Visits  4    PT Start Time  1017    PT Stop Time  1101    PT Time Calculation (min)  44 min    Activity Tolerance  Patient tolerated treatment well    Behavior During Therapy  [facility] for tasks assessed/performed       Past Medical History:  Diagnosis Date  . Bipolar 1 disorder ([place])    followed by psychiatry  . Chronic kidney disease    hx of short term dialysis  . 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 ETOH abuse   . Foot drop, left   . Foot drop, left foot   . Polysubstance abuse ([place])    history of  . Smoker   . Swallowing difficulty    cannot swallow pills  . Teeth decayed   . Vitamin D deficiency     Past Surgical History:  Procedure Laterality Date  . arm surgery Left    compartment syndrome surgeries  . CESAREAN SECTION     fetal distress  . HAND SURGERY     for fracture  . TONSILLECTOMY    . TOOTH EXTRACTION Bilateral 10/26/2018   Procedure: MULTIPLE EXTRACTION # 18, 21, 22, 27, 28;  Surgeon: Diona Browner, DDS;  Location: [facility];  Service: Oral Surgery;  Laterality: Bilateral;    There were no vitals filed for this visit.  Subjective Assessment - 01/19/20 1020    Subjective  Had a sewer problem in my house and I've been super busy trying to rip up the flooring.  I haven't had a chance to do my exercises.    Pertinent History  PMH  include bipolar, cellulits BLEs, rhabdomyolysis, ETOH and polysubstance abuse, L arm surgery due to compartment syndrome    Patient Stated Goals  Pt's goal for PT is to get all the function back in my L foot.    Currently in Pain?  No/denies         [facility] PT Assessment - 01/19/20 1040      6 Minute walk- Post Test   6 Minute Walk Post Test  yes    HR (bpm)  102    02 Sat (%RA)  97 %    Modified Borg Scale for Dyspnea  0- Nothing at all    Perceived Rate of Exertion (Borg)  11- Fairly light      6 minute walk test results    Aerobic Endurance Distance Walked  951      Functional Gait  Assessment   Gait assessed   Yes    Gait Level Surface  Walks 20 ft, slow speed, abnormal gait pattern, evidence for imbalance or deviates 10-15 in outside of the 12 in walkway width. Requires more than 7 sec to ambulate 20 ft.   7.69   Change in  Gait Speed  Able to change speed, demonstrates mild gait deviations, deviates 6-10 in outside of the 12 in walkway width, or no gait deviations, unable to achieve a major change in velocity, or uses a change in velocity, or uses an assistive device.    Gait with Horizontal Head Turns  Performs head turns smoothly with slight change in gait velocity (eg, minor disruption to smooth gait path), deviates 6-10 in outside 12 in walkway width, or uses an assistive device.    Gait with Vertical Head Turns  Performs task with slight change in gait velocity (eg, minor disruption to smooth gait path), deviates 6 - 10 in outside 12 in walkway width or uses assistive device    Gait and Pivot Turn  Pivot turns safely within 3 sec and stops quickly with no loss of balance.    Step Over Obstacle  Is able to step over one shoe box (4.5 in total height) but must slow down and adjust steps to clear box safely. May require verbal cueing.    Gait with Narrow Base of Support  Ambulates 4-7 steps.   7 steps   Gait with Eyes Closed  Cannot walk 20 ft without assistance, severe gait  deviations or imbalance, deviates greater than 15 in outside 12 in walkway width or will not attempt task.   veers to R side   Ambulating Backwards  Walks 20 ft, uses assistive device, slower speed, mild gait deviations, deviates 6-10 in outside 12 in walkway width.    Steps  Alternating feet, no rail.    Total Score  17    FGA comment:  Scores <22/30 indicate increased fall risk                   [facility] Adult PT Treatment/Exercise - 01/19/20 1040      Ambulation/Gait   Ambulation/Gait  Yes    Ambulation/Gait Assistance  6: Modified independent (Device/Increase time);5: Supervision    Ambulation Distance (Feet)  980 Feet   See 6MWT distance-   Assistive device  None    Gait Pattern  Step-through pattern;Decreased arm swing - left;Decreased step length - left;Decreased dorsiflexion - left;Decreased weight shift to left;Decreased trunk rotation;Poor foot clearance - left   L hip rotation to help with foot clearance   Ambulation Surface  Level;Indoor    Stairs  Yes    Stairs Assistance  5: Supervision    Stairs Assistance Details (indicate cue type and reason)  Uses rail with step through pattern (preference); slowed descent on steps; with no rail, pt is able to perform in alternating pattern, slowly    Stair Management Technique  One rail Left;No rails;Alternating pattern;Forwards    Number of Stairs  4   x 4 reps with rail, 1 rep no rail   Height of Stairs  6      Standardized Balance Assessment   Standardized Balance Assessment  Dynamic Gait Index      Dynamic Gait Index   Level Surface  Mild Impairment    Change in Gait Speed  Mild Impairment    Gait with Horizontal Head Turns  Mild Impairment    Gait with Vertical Head Turns  Mild Impairment    Gait and Pivot Turn  Normal    Step Over Obstacle  Normal    Step Around Obstacles  Normal    Steps  Normal    Total Score  20      High Level Balance   High Level  Balance Comments  SLS LLE:  1.2 sec, 1.41 sec, 3.5 sec;  tandem stance 30 seconds LLE posterior position with one episode of intermittent UE support      Knee/Hip Exercises: Standing   Step Down  Hand Hold: 1;2 sets;10 reps;Left    Other Standing Knee Exercises  Alternating step taps to 6" step, intermittent UE support x 10 reps             PT Education - 01/19/20 1426    Education Details  Discussed progress towards goals, POC beyond next week (pt in agreement for requesting additional visits, 1x/wk for 4 weeks, to work on additional gait training with foot-up brace-if she receives it)    Person(s) Educated  Patient    Methods  Explanation    Comprehension  Verbalized understanding       PT Short Term Goals - 12/22/19 1606      PT SHORT TERM GOAL #1   Title  Patient will be independent with progression of HEP for LLE strengthening, stretching, and balance/gait.  (Target for all STGs 12/22/2019)    Baseline  need to continue to progress HEP and revise as pt's strength/balance improve    Time  3    Period  Weeks    Status  On-going      PT SHORT TERM GOAL #2   Title  Pt will perform 10 reps sit<>stand with no UE support, BLES in equal weightbearing position for improved functional strength and use of LLE.    Time  3    Period  Weeks    Status  Achieved      PT SHORT TERM GOAL #3   Title  Pt will improve single limb stance to at least 2 seconds on LLE for improved functional LLE strength.    Baseline  1.59 sec at best on LLE on 12/22/19.  >20 sec on RLE    Time  3    Period  Weeks    Status  Not Met        PT Long Term Goals - 01/19/20 1428      PT LONG TERM GOAL #1   Title  Patient will be independent with progression of HEP (Target for all LTGs:  01/26/2020)    Baseline  HEP initiated and revised, 11/29/2019    Time  7    Period  Weeks    Status  Revised      PT LONG TERM GOAL #2   Title  Pt will improve DGI score to at least 19/24 for decreased fall risk.    Baseline  DGI 16/24; scores <19/24 indicate increased fall  risk.  01/19/2020:  20/24    Time  7    Period  Weeks    Status  Achieved      PT LONG TERM GOAL #3   Title  Pt will improve tandem stance with LLE in posterior position to at least 15 seconds demonstrating improved LLE strength.    Baseline  tandem stance with LLE in posterior position 5.81 sec; 01/19/2020:  30 seconds LLE posterior with 1 episode of intermittent UE support    Time  7    Period  Weeks    Status  Achieved      PT LONG TERM GOAL #4   Title  Pt will negotiate at least 4 steps with bilateral handrails, step through pattern, modified independently.    Baseline  step to pattern with bilateral rails, RLE leading up and  RLE leading down    Time  7    Period  Weeks    Status  Achieved            Plan - 01/19/20 1428    Clinical Impression Statement  Began assessing LTGs this visit; pt has met LTGs 2, 3, 4.  Reviewed HEP this visit (standing portion), with pt return demo understanding.  Pt is being seen by [facility] [employer] to try to get a personal foot-up brace, which she has not yet gotten.  Assessed 6 MWT test today, wtih pt well below normal values at 951 ft in 6 minutes; FGA score 17/30, indicating fall risk.  Pt is making progress with PT, but decreased LLE strength continues to impact gait pattern and balance.  Pt will continue to benefit from skilled PT to address further strength, balance, gait training.    Personal Factors and Comorbidities  Comorbidity 3+;Behavior Pattern    Comorbidities  HX of bipolar, cellulits BLEs, rhabdomyolysis, ETOH and polysubstance abuse, L arm surgery due to compartment syndrome    Examination-Activity Limitations  Locomotion Level;Squat;Stairs;Stand    Examination-Participation Restrictions  Community Activity    Stability/Clinical Decision Making  Evolving/Moderate complexity    Rehab Potential  Good    PT Frequency  Other (comment)   1x/wk for 3 weeks, then 1x/wk for 4 weeks, per recert 01/28/8587   PT Duration  Other (comment)   7  weeks total POC   PT Treatment/Interventions  ADLs/Self Care Home Management;Gait training;DME Instruction;Electrical Stimulation;Stair training;Functional mobility training;Therapeutic activities;Therapeutic exercise;Balance training;Neuromuscular re-education;Patient/family education;Orthotic Fit/Training    PT Next Visit Plan  Review HEP on mat (reviewed standing ones today) and update as needed; review step ups for SLS (provided update to HEP this visit); discussed adding 1x/wk for 4 additional weeks after next visit; PT will need to complete recert and submit to Medicaid.    PT Home Exercise Plan  Access Code: [email or web address] URL: [email or web address] Date: 01/05/2020 Prepared by: Nita Sells    Consulted and Agree with Plan of Care  Patient       Patient will benefit from skilled therapeutic intervention in order to improve the following deficits and impairments:  Abnormal gait, Decreased balance, Decreased mobility, Difficulty walking, Decreased range of motion, Decreased strength, Impaired sensation, Impaired flexibility  Visit Diagnosis: Muscle weakness (generalized)  Other abnormalities of gait and mobility     Problem List Patient Active Problem List   Diagnosis Date Noted  . Smoker 05/06/2016  . Bipolar disorder ([place]) 05/06/2016    Justn Quale W. 01/19/2020, 2:40 PM Frazier Butt., PT  [facility] [address] [place], [place], [address] Phone: [phone]   Fax:  [phone]  Name: [name] MRN: [id number] Date of Birth: May 28, 1971

## 2020-01-24 ENCOUNTER — Other Ambulatory Visit: Payer: Self-pay

## 2020-01-24 ENCOUNTER — Encounter: Payer: Self-pay | Admitting: Occupational Therapy

## 2020-01-24 ENCOUNTER — Ambulatory Visit: Payer: Medicaid Other | Attending: Family Medicine | Admitting: Occupational Therapy

## 2020-01-24 DIAGNOSIS — R209 Unspecified disturbances of skin sensation: Secondary | ICD-10-CM | POA: Diagnosis present

## 2020-01-24 DIAGNOSIS — M79602 Pain in left arm: Secondary | ICD-10-CM | POA: Diagnosis present

## 2020-01-24 DIAGNOSIS — M6281 Muscle weakness (generalized): Secondary | ICD-10-CM

## 2020-01-24 DIAGNOSIS — R29818 Other symptoms and signs involving the nervous system: Secondary | ICD-10-CM | POA: Diagnosis present

## 2020-01-24 DIAGNOSIS — M25642 Stiffness of left hand, not elsewhere classified: Secondary | ICD-10-CM

## 2020-01-24 DIAGNOSIS — R2689 Other abnormalities of gait and mobility: Secondary | ICD-10-CM | POA: Diagnosis present

## 2020-01-24 DIAGNOSIS — R208 Other disturbances of skin sensation: Secondary | ICD-10-CM

## 2020-01-24 DIAGNOSIS — M25632 Stiffness of left wrist, not elsewhere classified: Secondary | ICD-10-CM

## 2020-01-24 DIAGNOSIS — R2681 Unsteadiness on feet: Secondary | ICD-10-CM | POA: Diagnosis present

## 2020-01-24 DIAGNOSIS — R278 Other lack of coordination: Secondary | ICD-10-CM

## 2020-01-24 NOTE — Therapy (Signed)
[facility] [address] [place], [place], [address] Phone: [phone]   Fax:  [phone]  Occupational Therapy Treatment  Patient Details  Name: [name] MRN: [id number] Date of Birth: [birth year] Referring Provider (OT): Dr. Horald Pollen   Encounter Date: 01/24/2020  OT End of Session - 01/24/20 0857    Visit Number  5    Number of Visits  9    Date for OT Re-Evaluation  02/18/20    Authorization Type  Medicaid    Authorization Time Period  8 visits through 02/29/20    Authorization - Visit Number  4    Authorization - Number of Visits  8    OT Start Time  0849    OT Stop Time  0930    OT Time Calculation (min)  41 min    Activity Tolerance  Patient tolerated treatment well    Behavior During Therapy  [facility] for tasks assessed/performed       Past Medical History:  Diagnosis Date  . Bipolar 1 disorder ([place])    followed by psychiatry  . Chronic kidney disease    hx of short term dialysis  . 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 ETOH abuse   . Foot drop, left   . Foot drop, left foot   . Polysubstance abuse ([place])    history of  . Smoker   . Swallowing difficulty    cannot swallow pills  . Teeth decayed   . Vitamin D deficiency     Past Surgical History:  Procedure Laterality Date  . arm surgery Left    compartment syndrome surgeries  . CESAREAN SECTION     fetal distress  . HAND SURGERY     for fracture  . TONSILLECTOMY    . TOOTH EXTRACTION Bilateral 10/26/2018   Procedure: MULTIPLE EXTRACTION # 18, 21, 22, 27, 28;  Surgeon: Diona Browner, DDS;  Location: [facility];  Service: Oral Surgery;  Laterality: Bilateral;    There were no vitals filed for this visit.  Subjective Assessment - 01/24/20 0857    Subjective   no pain    Patient Stated Goals  I would like to be able to use my hand correctly, hold things    Currently in Pain?  No/denies          Sitting,  closed-chain shoulder flex and diagonals with BUEs with ball x10 each for stretch.  Reviewed HEP issued last visit.--pt returned demo each x10 with min v.c. for positioning intermittently.  Prone, scapular retraction with shoulders positioned in extension and abduction x10 each, followed by wt. Bearing on elbows with head/chest lift x10 each for scapular depression.  Sitting, functional reaching overhead to place small pegs in vertical pegboard to copy design for incr coordination (min-mod difficulty/drops) and activity tolerance, min v.c. for shoulder compensation  Functional reaching to place/remove clothespins with 1-8lb resistance on vertical pole for incr pinch strength and activity tolerance with min v.c. for shoulder positioning.  Arm bike x87mn level 1 for reciprocal movement/conditioning without rest, forwards/backwards.       OT Short Term Goals - 01/24/20 0920      OT SHORT TERM GOAL #1   Title  Pt will be independent with HEP for incr coordination.--check STGs 01/20/20    Baseline  dependent    Time  4    Period  Weeks    Status  Achieved      OT  SHORT TERM GOAL #2   Title  Pt will incr L grip strength by at least 5lbs for incr ease for grasping/lifting objects.    Baseline  32.8lbs    Time  4    Period  Weeks    Status  Achieved   39.6lbs     OT SHORT TERM GOAL #3   Title  Pt will improve lateral and 3point pinch strength by at least 2lbs each for incr ease with ADLs.    Baseline  lateral-8lbs, 3point 4lbs    Time  4    Period  Weeks    Status  On-going   01/17/20:  lateral-10lbs (met), 3point-5lb (improved by 1lb)     OT SHORT TERM GOAL #4   Title  Pt wil improve L hand coordination for ADLs as shown by improving time on 9-hole peg test by at least 8sec.    Baseline  32.18sec    Time  4    Period  Weeks    Status  On-going   01/17/20:  29.35sec     OT SHORT TERM GOAL #5   Title  ---        OT Long Term Goals - 12/20/19 1522      OT LONG TERM GOAL  #1   Title  Pt will be independent with strengthening HEP.--check LTGs 02/17/20    Baseline  dependent    Time  8    Period  Weeks    Status  New      OT LONG TERM GOAL #2   Title  Pt improve L hand grip strength by at least 10lbs to assist in lifting/gripping objects.    Baseline  32.8lbs    Time  8    Period  Weeks    Status  New      OT LONG TERM GOAL #3   Title  Pt will be able to retrieve 3lb object from overhead shelf safely with LUE.    Baseline  unable    Time  8    Period  Weeks    Status  New      OT LONG TERM GOAL #4   Title  Pt will report use LUE as nondominant assist at least 90% of the time for ADLs/IADLs.    Baseline  pt reports that she typically does tasks with RUE only.    Time  8    Period  Weeks    Status  New      OT LONG TERM GOAL #5   Title  --            Plan - 01/24/20 0858    Clinical Impression Statement  Pt is progressing towards gs goals with improving coordination and strength.    OT Occupational Profile and History  Detailed Assessment- Review of Records and additional review of physical, cognitive, psychosocial history related to current functional performance    Occupational performance deficits (Please refer to evaluation for details):  ADL's;IADL's    Body Structure / Function / Physical Skills  ADL;Dexterity;ROM;IADL;Balance;Sensation;Strength;FMC;Coordination;UE functional use    Rehab Potential  Good    Clinical Decision Making  Several treatment options, min-mod task modification necessary    Comorbidities Affecting Occupational Performance:  May have comorbidities impacting occupational performance    Modification or Assistance to Complete Evaluation   Min-Moderate modification of tasks or assist with assess necessary to complete eval    OT Frequency  1x / week    OT Duration  8 weeks   +eval   OT Treatment/Interventions  Self-care/ADL training;Moist Heat;Fluidtherapy;DME and/or AE instruction;Splinting;Therapeutic  activities;Aquatic Therapy;Ultrasound;Therapeutic exercise;Scar mobilization;Passive range of motion;Neuromuscular education;Cryotherapy;Electrical Stimulation;Paraffin;Manual Therapy;Patient/family education    Plan  continue to address functional use of LUE, progress strengthening as able    Consulted and Agree with Plan of Care  Patient       Patient will benefit from skilled therapeutic intervention in order to improve the following deficits and impairments:   Body Structure / Function / Physical Skills: ADL, Dexterity, ROM, IADL, Balance, Sensation, Strength, FMC, Coordination, UE functional use       Visit Diagnosis: Muscle weakness (generalized)  Other lack of coordination  Other disturbances of skin sensation  Other symptoms and signs involving the nervous system  Stiffness of left wrist, not elsewhere classified  Stiffness of left hand, not elsewhere classified    Problem List Patient Active Problem List   Diagnosis Date Noted  . Smoker 05/06/2016  . Bipolar disorder ([place]) 05/06/2016    [facility] 01/24/2020, 9:27 AM  [facility] [address] [place], [place], [address] Phone: [phone]   Fax:  [phone]  Name: [name] MRN: [id number] Date of Birth: [birth year]   Vianne Bulls, OTR/L [facility] [address] [place], [place]  [address] [phone] phone [phone] 01/24/20 9:27 AM

## 2020-01-26 ENCOUNTER — Ambulatory Visit: Payer: Medicaid Other | Admitting: Physical Therapy

## 2020-01-26 ENCOUNTER — Other Ambulatory Visit: Payer: Self-pay

## 2020-01-26 DIAGNOSIS — M6281 Muscle weakness (generalized): Secondary | ICD-10-CM | POA: Diagnosis not present

## 2020-01-26 DIAGNOSIS — R2689 Other abnormalities of gait and mobility: Secondary | ICD-10-CM

## 2020-01-26 NOTE — Therapy (Addendum)
[facility] [address] [place], [place], [address] Phone: [phone]   Fax:  [phone]  Physical Therapy Treatment  Patient Details  Name: [name] MRN: [id number] Date of Birth: 1971-01-14 Referring Provider (PT): Horald Pollen   Encounter Date: 01/26/2020  PT End of Session - 01/26/20 1247    Visit Number  12    Number of Visits  12    Date for PT Re-Evaluation  56/81/27   recert 03/23/7000   Authorization Type  4 additional visits approved 2/12-3/11/21    Authorization - Visit Number  4    Authorization - Number of Visits  4    PT Start Time  7494    PT Stop Time  1230    PT Time Calculation (min)  42 min    Activity Tolerance  Patient tolerated treatment well    Behavior During Therapy  [facility] for tasks assessed/performed       Past Medical History:  Diagnosis Date  . Bipolar 1 disorder ([place])    followed by psychiatry  . Chronic kidney disease    hx of short term dialysis  . 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 ETOH abuse   . Foot drop, left   . Foot drop, left foot   . Polysubstance abuse ([place])    history of  . Smoker   . Swallowing difficulty    cannot swallow pills  . Teeth decayed   . Vitamin D deficiency     Past Surgical History:  Procedure Laterality Date  . arm surgery Left    compartment syndrome surgeries  . CESAREAN SECTION     fetal distress  . HAND SURGERY     for fracture  . TONSILLECTOMY    . TOOTH EXTRACTION Bilateral 10/26/2018   Procedure: MULTIPLE EXTRACTION # 18, 21, 22, 27, 28;  Surgeon: Diona Browner, DDS;  Location: [facility];  Service: Oral Surgery;  Laterality: Bilateral;    There were no vitals filed for this visit.     [facility] Adult PT Treatment/Exercise - 01/26/20 0001      Transfers   Transfers  Sit to Stand;Stand to Sit    Sit to Stand  6: Modified independent (Device/Increase time)    Stand to Sit  6: Modified independent  (Device/Increase time)      Ambulation/Gait   Ambulation/Gait  Yes    Ambulation/Gait Assistance  6: Modified independent (Device/Increase time);5: Supervision    Ambulation/Gait Assistance Details  on treadmill 9 minutes at 1.8 and 4% incline    Ambulation Distance (Feet)  --   around clinic between activities and treadmill   Assistive device  None    Gait Pattern  Step-through pattern;Decreased arm swing - left;Decreased step length - left;Decreased dorsiflexion - left;Decreased weight shift to left;Decreased trunk rotation;Poor foot clearance - left    Ambulation Surface  Level;Indoor      Exercises   Exercises  Knee/Hip      Knee/Hip Exercises: Aerobic   Elliptical  2 minutes forward level 1.5 with seated rest break afterwards then 2 minutes backward at same level    Tread Mill  Treadmill x 9 minutes at 1.8 mph and 4% incline with bil UE support      Knee/Hip Exercises: Machines for Strengthening   Other Machine  Leg press bil 60# x 15 then single leg 45# x 15 each      Knee/Hip Exercises: Standing   Lateral  Step Up  Both;10 reps;Step Height: 6";Hand Hold: 0    Forward Step Up  Both;1 set;15 reps;Step Height: 6";Hand Hold: 0      Knee/Hip Exercises: Supine   Bridges with Clamshell  Strengthening;Both;10 reps    Other Supine Knee/Hip Exercises  bridge x 10 with march bil sides    Other Supine Knee/Hip Exercises  hip abd/add with blue theraband bil sides then single leg x 10 reps each      Knee/Hip Exercises: Sidelying   Hip ABduction  Strengthening;Both;10 reps;Other (comment)   blue theraband   Clams  10 reps bil sides with blue theraband and cues to keep feet together/technique             PT Education - 01/26/20 1246    Education Details  Reviewed remainder of HEP    Person(s) Educated  Patient    Methods  Explanation;Demonstration    Comprehension  Verbalized understanding       PT Short Term Goals - 12/22/19 1606      PT SHORT TERM GOAL #1   Title   Patient will be independent with progression of HEP for LLE strengthening, stretching, and balance/gait.  (Target for all STGs 12/22/2019)    Baseline  need to continue to progress HEP and revise as pt's strength/balance improve    Time  3    Period  Weeks    Status  On-going      PT SHORT TERM GOAL #2   Title  Pt will perform 10 reps sit<>stand with no UE support, BLES in equal weightbearing position for improved functional strength and use of LLE.    Time  3    Period  Weeks    Status  Achieved      PT SHORT TERM GOAL #3   Title  Pt will improve single limb stance to at least 2 seconds on LLE for improved functional LLE strength.    Baseline  1.59 sec at best on LLE on 12/22/19.  >20 sec on RLE    Time  3    Period  Weeks    Status  Not Met        PT Long Term Goals - 01/19/20 1428      PT LONG TERM GOAL #1   Title  Patient will be independent with progression of HEP (Target for all LTGs:  01/26/2020)    Baseline  HEP initiated and revised, 11/29/2019    Time  7    Period  Weeks    Status  Revised      PT LONG TERM GOAL #2   Title  Pt will improve DGI score to at least 19/24 for decreased fall risk.    Baseline  DGI 16/24; scores <19/24 indicate increased fall risk.  01/19/2020:  20/24    Time  7    Period  Weeks    Status  Achieved      PT LONG TERM GOAL #3   Title  Pt will improve tandem stance with LLE in posterior position to at least 15 seconds demonstrating improved LLE strength.    Baseline  tandem stance with LLE in posterior position 5.81 sec; 01/19/2020:  30 seconds LLE posterior with 1 episode of intermittent UE support    Time  7    Period  Weeks    Status  Achieved      PT LONG TERM GOAL #4   Title  Pt will negotiate at least 4 steps with bilateral  handrails, step through pattern, modified independently.    Baseline  step to pattern with bilateral rails, RLE leading up and RLE leading down    Time  7    Period  Weeks    Status  Achieved             Plan - 01/26/20 1248    Clinical Impression Statement  LTG 1 met.  Pt states that Level 1 states that Medicaid would not cover foot up brace.  Pt had previously made a version of foot up brace with velcro but not sure where it is now.  Pt continues with L foot slap with gait.  Renewal per Mady Haagensen, PT.    Personal Factors and Comorbidities  Comorbidity 3+;Behavior Pattern    Comorbidities  HX of bipolar, cellulits BLEs, rhabdomyolysis, ETOH and polysubstance abuse, L arm surgery due to compartment syndrome    Examination-Activity Limitations  Locomotion Level;Squat;Stairs;Stand    Examination-Participation Restrictions  Community Activity    Stability/Clinical Decision Making  Evolving/Moderate complexity    Rehab Potential  Good    PT Frequency  Other (comment)   1x/wk for 3 weeks, then 1x/wk for 4 weeks, per recert 0/05/3709   PT Duration  Other (comment)   7 weeks total POC   PT Treatment/Interventions  ADLs/Self Care Home Management;Gait training;DME Instruction;Electrical Stimulation;Stair training;Functional mobility training;Therapeutic activities;Therapeutic exercise;Balance training;Neuromuscular re-education;Patient/family education;Orthotic Fit/Training    PT Next Visit Plan  PT will need to complete recert and submit to Medicaid.    PT Home Exercise Plan  Access Code: [email or web address] URL: [email or web address] Date: 01/05/2020 Prepared by: Nita Sells    Consulted and Agree with Plan of Care  Patient       Patient will benefit from skilled therapeutic intervention in order to improve the following deficits and impairments:  Abnormal gait, Decreased balance, Decreased mobility, Difficulty walking, Decreased range of motion, Decreased strength, Impaired sensation, Impaired flexibility  Visit Diagnosis: Muscle weakness (generalized)  Other abnormalities of gait and mobility     Problem List Patient Active Problem List   Diagnosis Date Noted  .  Smoker 05/06/2016  . Bipolar disorder ([place]) 05/06/2016    Narda Bonds, PTA [facility] 01/26/20 12:52 PM Phone: [phone] Fax: [facility] [employer] [address] [place], [place], [address] Phone: [phone]   Fax:  [phone]  Name: [name] MRN: [id number] Date of Birth: [birth year]  For recert:    43/15/40 0867  PT Visits / Re-Eval  Visit Number 12  Number of Visits 16  Date for PT Re-Evaluation  (recert 04/23/9508)  Authorization  Authorization Type 4 additional visits approved 2/12-3/11/21  Authorization - Visit Number 4  Authorization - Number of Visits 4  PT Time Calculation  PT Start Time 1148  PT Stop Time 1230  PT Time Calculation (min) 42 min  PT - End of Session  Activity Tolerance Patient tolerated treatment well  Behavior During Therapy [facility] for tasks assessed/performed     01/26/20 1248  Plan  Clinical Impression Statement LTG 1 met.  Pt states that Level 1 states that Medicaid would not cover foot up brace.  Pt had previously made a version of foot up brace with velcro but not sure where it is now.  Pt continues with L foot slap with gait.  Renewal per Mady Haagensen, PT. (Addendum:  please see recert for updated goals.  Mady Haagensen, PT)  Personal Factors and Comorbidities Comorbidity 3+;Behavior Pattern  Comorbidities HX of bipolar, cellulits BLEs, rhabdomyolysis, ETOH and polysubstance abuse, L arm surgery due to compartment syndrome  Examination-Activity Limitations Locomotion Level;Squat;Stairs;Stand  Examination-Participation Restrictions Community Activity  Pt will benefit from skilled therapeutic intervention in order to improve on the following deficits Abnormal gait;Decreased balance;Decreased mobility;Difficulty walking;Decreased range of motion;Decreased strength;Impaired sensation;Impaired flexibility  Stability/Clinical  Decision Making Evolving/Moderate complexity  Rehab Potential Good  PT Frequency 1x / week  PT Duration 4 weeks (per recert 05/30/9380)  PT Treatment/Interventions ADLs/Self Care Home Management;Gait training;DME Instruction;Electrical Stimulation;Stair training;Functional mobility training;Therapeutic activities;Therapeutic exercise;Balance training;Neuromuscular re-education;Patient/family education;Orthotic Fit/Training  PT Next Visit Plan PT will need to complete recert and submit to Medicaid.  PT Home Exercise Plan Access Code: [email or web address] URL: [email or web address] Date: 01/05/2020 Prepared by: Nita Sells  Consulted and Agree with Plan of Care Patient     PT Long Term Goals - 01/28/20 1644      PT LONG TERM GOAL #1   Title  Patient will be independent with final progression of HEP and transition to community fitness. (Target for all LTGs:  4 weeks 03/01/2020 )    Baseline  Pt independent with current HEP, 01/26/2020; continues to have L ankle weakness, as noted by foot slap    Time  4    Period  Weeks    Status  Revised      PT LONG TERM GOAL #2   Title  Pt will improve FGA score to at least 20/30 for decreased fall risk    Baseline  01/26/2020:  FGA 17/30 (Scores <22/30 indicate increased fall risk)    Time  4    Period  Weeks    Status  Revised      PT LONG TERM GOAL #3   Title  Pt will improve 6 MWT distance to at least 1080 ft, to demonstrate improved gait endurance and functional strength.    Baseline  980 ft in 6 MWT 01/26/2020    Time  4    Period  Weeks    Status  New      PT LONG TERM GOAL #4   Title  --    Baseline  --    Time  --    Period  --    Status  --      Mady Haagensen, PT 01/28/20 4:54 PM Phone: [phone] Fax: [phone]

## 2020-01-28 NOTE — Addendum Note (Signed)
Addended by: Frazier Butt on: 01/28/2020 04:57 PM   Modules accepted: Orders

## 2020-01-31 ENCOUNTER — Other Ambulatory Visit: Payer: Self-pay

## 2020-01-31 ENCOUNTER — Encounter: Payer: Self-pay | Admitting: Occupational Therapy

## 2020-01-31 ENCOUNTER — Ambulatory Visit: Payer: Medicaid Other | Admitting: Occupational Therapy

## 2020-01-31 DIAGNOSIS — M25632 Stiffness of left wrist, not elsewhere classified: Secondary | ICD-10-CM

## 2020-01-31 DIAGNOSIS — M6281 Muscle weakness (generalized): Secondary | ICD-10-CM | POA: Diagnosis not present

## 2020-01-31 DIAGNOSIS — R208 Other disturbances of skin sensation: Secondary | ICD-10-CM

## 2020-01-31 DIAGNOSIS — R29818 Other symptoms and signs involving the nervous system: Secondary | ICD-10-CM

## 2020-01-31 DIAGNOSIS — R278 Other lack of coordination: Secondary | ICD-10-CM

## 2020-01-31 DIAGNOSIS — M25642 Stiffness of left hand, not elsewhere classified: Secondary | ICD-10-CM

## 2020-01-31 NOTE — Therapy (Signed)
[facility] [address] [place], [place], [address] Phone: [phone]   Fax:  [phone]  Occupational Therapy Treatment  Patient Details  Name: [name] MRN: [id number] Date of Birth: 1971/05/11 Referring Provider (OT): Dr. Horald Pollen   Encounter Date: 01/31/2020  OT End of Session - 01/31/20 0853    Visit Number  6    Number of Visits  9    Date for OT Re-Evaluation  02/18/20    Authorization Type  Medicaid    Authorization Time Period  8 visits through 02/29/20    Authorization - Visit Number  5    Authorization - Number of Visits  8    OT Start Time  0849    OT Stop Time  0930    OT Time Calculation (min)  41 min    Activity Tolerance  Patient tolerated treatment well    Behavior During Therapy  [facility] for tasks assessed/performed       Past Medical History:  Diagnosis Date  . Bipolar 1 disorder ([place])    followed by psychiatry  . Chronic kidney disease    hx of short term dialysis  . 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 ETOH abuse   . Foot drop, left   . Foot drop, left foot   . Polysubstance abuse ([place])    history of  . Smoker   . Swallowing difficulty    cannot swallow pills  . Teeth decayed   . Vitamin D deficiency     Past Surgical History:  Procedure Laterality Date  . arm surgery Left    compartment syndrome surgeries  . CESAREAN SECTION     fetal distress  . HAND SURGERY     for fracture  . TONSILLECTOMY    . TOOTH EXTRACTION Bilateral 10/26/2018   Procedure: MULTIPLE EXTRACTION # 18, 21, 22, 27, 28;  Surgeon: Diona Browner, DDS;  Location: [facility];  Service: Oral Surgery;  Laterality: Bilateral;    There were no vitals filed for this visit.  Subjective Assessment - 01/31/20 0850    Subjective   doing good    Patient Stated Goals  I would like to be able to use my hand correctly, hold things    Currently in Pain?  No/denies           Completing Purdue Pegboard with LUE for incr coordination with min difficulty.  Functional reaching to place/remove clothespins with 1-8lb resistance for incr pinch strength and reach.  In standing, closed-chain shoulder flex and diagonals x15 each with ball and 1lb wt on each wrist with min v.c. for shoulder positioning.  Functional reaching to place small pegs in vertical pegboard to copy design for incr coordination/functional reach with min difficulty/drops, min cueing for compensation, removing with in-hand manipulation with min difficulty.  Removing pegs from green putty for incr strength, min cueing for shoulder compensation.     OT Education - 01/31/20 0906    Education Details  Updated putty HEP to green    Person(s) Educated  Patient    Methods  Explanation;Demonstration;Verbal cues    Comprehension  Verbalized understanding;Returned demonstration;Verbal cues required       OT Short Term Goals - 01/24/20 0920      OT SHORT TERM GOAL #1   Title  Pt will be independent with HEP for incr coordination.--check STGs 01/20/20    Baseline  dependent    Time  4  Period  Weeks    Status  Achieved      OT SHORT TERM GOAL #2   Title  Pt will incr L grip strength by at least 5lbs for incr ease for grasping/lifting objects.    Baseline  32.8lbs    Time  4    Period  Weeks    Status  Achieved   39.6lbs     OT SHORT TERM GOAL #3   Title  Pt will improve lateral and 3point pinch strength by at least 2lbs each for incr ease with ADLs.    Baseline  lateral-8lbs, 3point 4lbs    Time  4    Period  Weeks    Status  On-going   01/17/20:  lateral-10lbs (met), 3point-5lb (improved by 1lb)     OT SHORT TERM GOAL #4   Title  Pt wil improve L hand coordination for ADLs as shown by improving time on 9-hole peg test by at least 8sec.    Baseline  32.18sec    Time  4    Period  Weeks    Status  On-going   01/17/20:  29.35sec     OT SHORT TERM GOAL #5   Title  ---        OT  Long Term Goals - 01/31/20 0854      OT LONG TERM GOAL #1   Title  Pt will be independent with strengthening HEP.--check LTGs 02/17/20    Baseline  dependent    Time  8    Period  Weeks    Status  New      OT LONG TERM GOAL #2   Title  Pt improve L hand grip strength by at least 10lbs to assist in lifting/gripping objects.    Baseline  32.8lbs    Time  8    Period  Weeks    Status  New      OT LONG TERM GOAL #3   Title  Pt will be able to retrieve 3lb object from overhead shelf safely with LUE.    Baseline  unable    Time  8    Period  Weeks    Status  New      OT LONG TERM GOAL #4   Title  Pt will report use LUE as nondominant assist at least 90% of the time for ADLs/IADLs.    Baseline  pt reports that she typically does tasks with RUE only.    Time  8    Period  Weeks    Status  Achieved      OT LONG TERM GOAL #5   Title  --            Plan - 01/31/20 0853    Clinical Impression Statement  Pt is progressing towards goals with improving coordination and strength and LUE functional use.    OT Occupational Profile and History  Detailed Assessment- Review of Records and additional review of physical, cognitive, psychosocial history related to current functional performance    Occupational performance deficits (Please refer to evaluation for details):  ADL's;IADL's    Body Structure / Function / Physical Skills  ADL;Dexterity;ROM;IADL;Balance;Sensation;Strength;FMC;Coordination;UE functional use    Rehab Potential  Good    Clinical Decision Making  Several treatment options, min-mod task modification necessary    Comorbidities Affecting Occupational Performance:  May have comorbidities impacting occupational performance    Modification or Assistance to Complete Evaluation   Min-Moderate modification of tasks or assist with assess  necessary to complete eval    OT Frequency  1x / week    OT Duration  8 weeks   +eval   OT Treatment/Interventions  Self-care/ADL  training;Moist Heat;Fluidtherapy;DME and/or AE instruction;Splinting;Therapeutic activities;Aquatic Therapy;Ultrasound;Therapeutic exercise;Scar mobilization;Passive range of motion;Neuromuscular education;Cryotherapy;Electrical Stimulation;Paraffin;Manual Therapy;Patient/family education    Plan  continue to address functional use of LUE, progress strengthening as able (? update band HEP to red), UBE    Consulted and Agree with Plan of Care  Patient       Patient will benefit from skilled therapeutic intervention in order to improve the following deficits and impairments:   Body Structure / Function / Physical Skills: ADL, Dexterity, ROM, IADL, Balance, Sensation, Strength, FMC, Coordination, UE functional use       Visit Diagnosis: Muscle weakness (generalized)  Other lack of coordination  Other disturbances of skin sensation  Other symptoms and signs involving the nervous system  Stiffness of left wrist, not elsewhere classified  Stiffness of left hand, not elsewhere classified    Problem List Patient Active Problem List   Diagnosis Date Noted  . Smoker 05/06/2016  . Bipolar disorder ([place]) 05/06/2016    [facility] 01/31/2020, 9:33 AM  [facility] [address] [place], [place], [address] Phone: [phone]   Fax:  [phone]  Name: [name] MRN: [id number] Date of Birth: [birth year]   Vianne Bulls, OTR/L [facility] [address] [place], [place]  [address] [phone] phone [phone] 01/31/20 9:33 AM

## 2020-02-06 ENCOUNTER — Other Ambulatory Visit: Payer: Self-pay

## 2020-02-06 ENCOUNTER — Ambulatory Visit: Payer: Medicaid Other | Admitting: Physical Therapy

## 2020-02-06 DIAGNOSIS — R2689 Other abnormalities of gait and mobility: Secondary | ICD-10-CM

## 2020-02-06 DIAGNOSIS — M6281 Muscle weakness (generalized): Secondary | ICD-10-CM

## 2020-02-06 NOTE — Patient Instructions (Signed)
Access Code: [email or web address] URL: [email or web address] Date: 02/06/2020 Prepared by: Mady Haagensen  Exercises Standing Ankle Dorsiflexion Stretch - 1 x daily - 7 x weekly - 3 reps - 1 sets - 30 second hold Bent Knee Fallouts - 1 x daily - 7 x weekly - 10 reps - 1 sets Marching Bridge - 1 x daily - 7 x weekly - 10 reps - 1 sets Clamshell with Resistance - 1 x daily - 7 x weekly - 10 reps - 1 sets - 3 seconds hold Sidelying Hip Abduction with Resistance at Ankle - 1 x daily - 7 x weekly - 10 reps - 1 sets Step Up - 1 x daily - 7 x weekly - 10 reps - 2 sets Lateral Step Ups - 1 x daily - 7 x weekly - 10 reps - 2 sets Tandem Stance with Chair Support - 1 x daily - 7 x weekly - 4 reps - 1 sets - 10 seconds hold Walking Tandem Stance - 1 x daily - 7 x weekly - 1 sets Single Leg Stance - 1 x daily - 7 x weekly - 3 reps - 1 sets - 10 seconds hold [place] with Counter Support - 1 x daily - 7 x weekly - 3 reps - 1 sets Hip Extension with Resistance Loop - 1 x daily - 7 x weekly - 10 reps - 1 sets Hip Abduction with Resistance Loop - 1 x daily - 7 x weekly - 10 reps - 1 sets Side Stepping with Resistance at Ankles - 1 x daily - 7 x weekly - 10 reps - 1 sets Sit to Stand in Stride with AFO and UE Assist in LE Alignment - 1 x daily - 7 x weekly - 10 reps - 1 sets  Wall Squat - 1 x daily - 5 x weekly - 2 sets - 10 reps Alternating Heel Raises - 1 x daily - 5 x weekly - 2 sets - 10 reps Stride Stance Weight Shift - 1 x daily - 5 x weekly - 2 sets - 10 reps
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
## 2020-02-07 ENCOUNTER — Encounter: Payer: Self-pay | Admitting: Occupational Therapy

## 2020-02-07 ENCOUNTER — Ambulatory Visit: Payer: Medicaid Other | Admitting: Occupational Therapy

## 2020-02-07 DIAGNOSIS — R208 Other disturbances of skin sensation: Secondary | ICD-10-CM

## 2020-02-07 DIAGNOSIS — M79602 Pain in left arm: Secondary | ICD-10-CM

## 2020-02-07 DIAGNOSIS — R278 Other lack of coordination: Secondary | ICD-10-CM

## 2020-02-07 DIAGNOSIS — R29818 Other symptoms and signs involving the nervous system: Secondary | ICD-10-CM

## 2020-02-07 DIAGNOSIS — M6281 Muscle weakness (generalized): Secondary | ICD-10-CM | POA: Diagnosis not present
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
## 2020-02-12 ENCOUNTER — Other Ambulatory Visit: Payer: Self-pay

## 2020-02-12 ENCOUNTER — Ambulatory Visit: Payer: Medicaid Other | Admitting: Physical Therapy

## 2020-02-12 DIAGNOSIS — R2689 Other abnormalities of gait and mobility: Secondary | ICD-10-CM

## 2020-02-12 DIAGNOSIS — M6281 Muscle weakness (generalized): Secondary | ICD-10-CM

## 2020-02-12 DIAGNOSIS — R2681 Unsteadiness on feet: Secondary | ICD-10-CM
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
## 2020-02-14 ENCOUNTER — Ambulatory Visit: Payer: Medicaid Other | Admitting: Occupational Therapy

## 2020-02-20 ENCOUNTER — Encounter: Payer: Self-pay | Admitting: Physical Therapy

## 2020-02-20 ENCOUNTER — Other Ambulatory Visit: Payer: Self-pay

## 2020-02-20 ENCOUNTER — Ambulatory Visit: Payer: Medicaid Other | Admitting: Occupational Therapy

## 2020-02-20 ENCOUNTER — Ambulatory Visit: Payer: Medicaid Other | Admitting: Physical Therapy

## 2020-02-20 DIAGNOSIS — M6281 Muscle weakness (generalized): Secondary | ICD-10-CM | POA: Diagnosis not present

## 2020-02-20 DIAGNOSIS — M25642 Stiffness of left hand, not elsewhere classified: Secondary | ICD-10-CM

## 2020-02-20 DIAGNOSIS — R278 Other lack of coordination: Secondary | ICD-10-CM

## 2020-02-20 DIAGNOSIS — R2681 Unsteadiness on feet: Secondary | ICD-10-CM

## 2020-02-20 DIAGNOSIS — R2689 Other abnormalities of gait and mobility: Secondary | ICD-10-CM

## 2020-02-20 DIAGNOSIS — R208 Other disturbances of skin sensation: Secondary | ICD-10-CM

## 2020-02-20 DIAGNOSIS — R29818 Other symptoms and signs involving the nervous system: Secondary | ICD-10-CM

## 2020-02-20 NOTE — Therapy (Signed)
[facility] [address] [place], [place], [address] Phone: [phone]   Fax:  [phone]  Occupational Therapy Treatment  Patient Details  Name: [name] MRN: [id number] Date of Birth: [birth year] Referring Provider (OT): Dr. Horald Pollen   Encounter Date: 02/20/2020  OT End of Session - 02/20/20 1024    Visit Number  8    Number of Visits  9    Date for OT Re-Evaluation  02/18/20    Authorization Type  Medicaid    Authorization Time Period  8 visits through 02/29/20    Authorization - Visit Number  7    Authorization - Number of Visits  8    OT Start Time  1020    OT Stop Time  1100    OT Time Calculation (min)  40 min    Activity Tolerance  Patient tolerated treatment well    Behavior During Therapy  [facility] for tasks assessed/performed       Past Medical History:  Diagnosis Date  . Bipolar 1 disorder ([place])    followed by psychiatry  . Chronic kidney disease    hx of short term dialysis  . 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 ETOH abuse   . Foot drop, left   . Foot drop, left foot   . Polysubstance abuse ([place])    history of  . Smoker   . Swallowing difficulty    cannot swallow pills  . Teeth decayed   . Vitamin D deficiency     Past Surgical History:  Procedure Laterality Date  . arm surgery Left    compartment syndrome surgeries  . CESAREAN SECTION     fetal distress  . HAND SURGERY     for fracture  . TONSILLECTOMY    . TOOTH EXTRACTION Bilateral 10/26/2018   Procedure: MULTIPLE EXTRACTION # 18, 21, 22, 27, 28;  Surgeon: Diona Browner, DDS;  Location: [facility];  Service: Oral Surgery;  Laterality: Bilateral;    There were no vitals filed for this visit.  Subjective Assessment - 02/20/20 1022    Subjective   Pt reports thumb pain    Patient Stated Goals  I would like to be able to use my hand correctly, hold things    Currently in Pain?  Yes    Pain Score   3     Pain Location  Other (Comment)   thumb   Pain Orientation  Left    Pain Descriptors / Indicators  Aching    Pain Type  Chronic pain    Pain Onset  In the past 7 days    Pain Frequency  Intermittent    Aggravating Factors   use    Pain Relieving Factors  rest            Treatment: Therapist started checking  progress towards goals. See goals for updates Review of theraband HEP and putty exercises, min v.c and demonstration initially, then pt returned demonstration.. Stacking and manipulating coins for improved fine motor coordination, min-mod difficulty, min v.c Pt reports thumb pain, and demo decreased opposition. She may benefit from 1 additional visit to consider possible splinting needs.                OT Education - 02/20/20 1045    Education Details  Upgraded HEP to green theraband , reviewed putty exercises, recommends pt uses red putty as green has been causing hand pain.  Person(s) Educated  Patient    Methods  Explanation;Demonstration;Verbal cues    Comprehension  Verbalized understanding;Returned demonstration;Verbal cues required       OT Short Term Goals - 02/20/20 1025      OT SHORT TERM GOAL #1   Title  Pt will be independent with HEP for incr coordination.--check STGs 01/20/20    Baseline  dependent    Time  4    Period  Weeks    Status  Achieved      OT SHORT TERM GOAL #2   Title  Pt will incr L grip strength by at least 5lbs for incr ease for grasping/lifting objects.    Baseline  32.8lbs    Time  4    Period  Weeks    Status  Achieved   39.6lbs     OT SHORT TERM GOAL #3   Title  Pt will improve lateral and 3point pinch strength by at least 2lbs each for incr ease with ADLs.    Baseline  lateral-8lbs, 3point 4lbs    Time  4    Period  Weeks    Status  Achieved   Lateral 10 lbs, 3 pt 8 lbs     OT SHORT TERM GOAL #4   Title  Pt wil improve L hand coordination for ADLs as shown by improving time on 9-hole peg test by at least  8sec.    Baseline  32.18sec    Time  4    Period  Weeks    Status  On-going   L 9 hole 26.85, 25.50     OT SHORT TERM GOAL #5   Title  ---        OT Long Term Goals - 02/20/20 1241      OT LONG TERM GOAL #1   Title  Pt will be independent with strengthening HEP.--check LTGs 02/17/20    Baseline  dependent    Time  8    Period  Weeks    Status  Achieved      OT LONG TERM GOAL #2   Title  Pt improve L hand grip strength by at least 10lbs to assist in lifting/gripping objects.    Baseline  32.8lbs    Time  8    Period  Weeks    Status  Achieved   45.8 , 42.9     OT LONG TERM GOAL #3   Title  Pt will be able to retrieve 3lb object from overhead shelf safely with LUE.    Baseline  unable    Time  8    Period  Weeks    Status  Achieved      OT LONG TERM GOAL #4   Title  Pt will report use LUE as nondominant assist at least 90% of the time for ADLs/IADLs.    Baseline  pt reports that she typically does tasks with RUE only.    Time  8    Period  Weeks    Status  Achieved      OT LONG TERM GOAL #5   Title  --            Plan - 02/20/20 1048    Clinical Impression Statement  Pt demonstrates good overall progress. She can benefit from 1 additional visit to address possible splinting needs.    OT Occupational Profile and History  Detailed Assessment- Review of Records and additional review of physical, cognitive, psychosocial history related to current functional performance  Occupational performance deficits (Please refer to evaluation for details):  ADL's;IADL's    Body Structure / Function / Physical Skills  ADL;Dexterity;ROM;IADL;Balance;Sensation;Strength;FMC;Coordination;UE functional use    Rehab Potential  Good    Clinical Decision Making  Several treatment options, min-mod task modification necessary    Comorbidities Affecting Occupational Performance:  May have comorbidities impacting occupational performance    Modification or Assistance to Complete  Evaluation   Min-Moderate modification of tasks or assist with assess necessary to complete eval    OT Frequency  1x / week    OT Duration  8 weeks   +eval   OT Treatment/Interventions  Self-care/ADL training;Moist Heat;Fluidtherapy;DME and/or AE instruction;Splinting;Therapeutic activities;Aquatic Therapy;Ultrasound;Therapeutic exercise;Scar mobilization;Passive range of motion;Neuromuscular education;Cryotherapy;Electrical Stimulation;Paraffin;Manual Therapy;Patient/family education    Plan  consider short opponens splint, check remaining goal for 9 hole peg test, anticipate d/c    Consulted and Agree with Plan of Care  Patient       Patient will benefit from skilled therapeutic intervention in order to improve the following deficits and impairments:   Body Structure / Function / Physical Skills: ADL, Dexterity, ROM, IADL, Balance, Sensation, Strength, FMC, Coordination, UE functional use       Visit Diagnosis: Muscle weakness (generalized)  Other lack of coordination  Other disturbances of skin sensation  Other symptoms and signs involving the nervous system  Stiffness of left hand, not elsewhere classified    Problem List Patient Active Problem List   Diagnosis Date Noted  . Smoker 05/06/2016  . Bipolar disorder ([place]) 05/06/2016    Zeferino Mounts 02/20/2020, 12:46 PM Theone Murdoch, OTR/L [phone] Phone: [phone] 12:47 PM 02/20/20 [facility] [address] [place], [place], [address] Phone: [phone]   Fax:  [phone]  Name: [name] MRN: [id number] Date of Birth: 1971/11/19

## 2020-02-20 NOTE — Therapy (Signed)
[facility] [address] [place], [place], [address] Phone: [phone]   Fax:  [phone]  Physical Therapy Treatment  Patient Details  Name: [name] MRN: [id number] Date of Birth: 03-10-1971 Referring Provider (PT): Horald Pollen   Encounter Date: 02/20/2020  PT End of Session - 02/20/20 1125    Visit Number  15    Number of Visits  16    Date for PT Re-Evaluation  --   recert 3/0/0762   Authorization Type  4 additional visits approved 2/12-3/11/21; 4 additional visits approved 02/06/2020-03/04/2020    Authorization - Visit Number  3    Authorization - Number of Visits  4    PT Start Time  0931    PT Stop Time  1015    PT Time Calculation (min)  44 min    Activity Tolerance  Patient tolerated treatment well    Behavior During Therapy  [facility] for tasks assessed/performed       Past Medical History:  Diagnosis Date  . Bipolar 1 disorder ([place])    followed by psychiatry  . Chronic kidney disease    hx of short term dialysis  . 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 ETOH abuse   . Foot drop, left   . Foot drop, left foot   . Polysubstance abuse ([place])    history of  . Smoker   . Swallowing difficulty    cannot swallow pills  . Teeth decayed   . Vitamin D deficiency     Past Surgical History:  Procedure Laterality Date  . arm surgery Left    compartment syndrome surgeries  . CESAREAN SECTION     fetal distress  . HAND SURGERY     for fracture  . TONSILLECTOMY    . TOOTH EXTRACTION Bilateral 10/26/2018   Procedure: MULTIPLE EXTRACTION # 18, 21, 22, 27, 28;  Surgeon: Diona Browner, DDS;  Location: [facility];  Service: Oral Surgery;  Laterality: Bilateral;    There were no vitals filed for this visit.  Subjective Assessment - 02/20/20 1116    Subjective  Pt continues to have discomfort/pain if trying to walk or stand without shoes.  Denies any current pain other than knees  being stiff.    Pertinent History  PMH include bipolar, cellulits BLEs, rhabdomyolysis, ETOH and polysubstance abuse, L arm surgery due to compartment syndrome    Patient Stated Goals  Pt's goal for PT is to get all the function back in my L foot.    Currently in Pain?  No/denies       [facility] Adult PT Treatment/Exercise - 02/20/20 0001      Transfers   Transfers  Sit to Stand;Stand to Sit    Sit to Stand  6: Modified independent (Device/Increase time)    Stand to Sit  6: Modified independent (Device/Increase time)      Ambulation/Gait   Ambulation/Gait  Yes    Ambulation/Gait Assistance  6: Modified independent (Device/Increase time)    Ambulation Distance (Feet)  --   around clinic for activities   Assistive device  None    Gait Pattern  Step-through pattern;Decreased arm swing - left;Decreased step length - left;Decreased dorsiflexion - left;Decreased weight shift to left;Decreased trunk rotation;Poor foot clearance - left    Ambulation Surface  Level;Indoor      High Level Balance   High Level Balance Activities  Other (comment)    High Level Balance Comments  For ankle strengthening- Standing on foam beam in // bars for static standing, stepping onto/off of beam without looking at feet >30 reps, standing eyes closed 10 sec x 4 reps.  Pt requiring intermittent UE support with eyes closed.  Move to corner for standing on pillows then foam for feet together then feet apart performing head turns/nods with eyes open then eyes closed.  Pt with increased difficulty with feet together.  Given as HEP.  Pt with poor L ankle movement/control for balance activities.      Knee/Hip Exercises: Aerobic   Elliptical  2 minutes forward level 1.5 with seated rest break afterwards then 2 minutes backward at same level    Other Aerobic  Scifit level 3.0 all 4 extremities x 4 minutes for flexibility/warm up      Ankle Exercises: Seated   Other Seated Ankle Exercises  seated in chair using ankle maze  mobility/strengthening board x 4 minutes             PT Education - 02/20/20 1123    Education Details  Added corner balance exercises to challenge ankles/balance, desenitizing exercises for feet    Person(s) Educated  Patient    Methods  Explanation;Demonstration;Verbal cues;Handout    Comprehension  Verbalized understanding;Returned demonstration       PT Short Term Goals - 12/22/19 1606      PT SHORT TERM GOAL #1   Title  Patient will be independent with progression of HEP for LLE strengthening, stretching, and balance/gait.  (Target for all STGs 12/22/2019)    Baseline  need to continue to progress HEP and revise as pt's strength/balance improve    Time  3    Period  Weeks    Status  On-going      PT SHORT TERM GOAL #2   Title  Pt will perform 10 reps sit<>stand with no UE support, BLES in equal weightbearing position for improved functional strength and use of LLE.    Time  3    Period  Weeks    Status  Achieved      PT SHORT TERM GOAL #3   Title  Pt will improve single limb stance to at least 2 seconds on LLE for improved functional LLE strength.    Baseline  1.59 sec at best on LLE on 12/22/19.  >20 sec on RLE    Time  3    Period  Weeks    Status  Not Met        PT Long Term Goals - 02/12/20 7681      PT LONG TERM GOAL #1   Title  Patient will be independent with final progression of HEP and transition to community fitness. (Target for all LTGs:  4 weeks 03/01/2020 )    Baseline  Pt independent with current HEP, 01/26/2020; continues to have L ankle weakness, as noted by foot slap    Time  4    Period  Weeks    Status  Revised      PT LONG TERM GOAL #2   Title  Pt will improve FGA score to at least 20/30 for decreased fall risk    Baseline  01/26/2020:  FGA 17/30 (Scores <22/30 indicate increased fall risk)    Time  4    Period  Weeks    Status  Revised      PT LONG TERM GOAL #3   Title  Pt will improve 6 MWT distance to at least 1130 ft, to  demonstrate  improved gait endurance and functional strength.    Baseline  980 ft in 6 MWT 01/26/2020; Goal met (1088 ft)and upgratded 02/12/2020    Time  4    Period  Weeks    Status  Revised            Plan - 02/20/20 1126    Clinical Impression Statement  Treatment session focused on education, balance and strength/endurance.  Pt continues to be challenged with compliant surfaces due to decreased AROM of L ankle.  Pt also educated on desensitization for bil feet.  Per PT POC, to d/c at next visit.    Personal Factors and Comorbidities  Comorbidity 3+;Behavior Pattern    Comorbidities  HX of bipolar, cellulits BLEs, rhabdomyolysis, ETOH and polysubstance abuse, L arm surgery due to compartment syndrome    Examination-Activity Limitations  Locomotion Level;Squat;Stairs;Stand    Examination-Participation Restrictions  Community Activity    Stability/Clinical Decision Making  Evolving/Moderate complexity    Rehab Potential  Good    PT Frequency  1x / week    PT Duration  4 weeks   per recert 02/26/4313   PT Treatment/Interventions  ADLs/Self Care Home Management;Gait training;DME Instruction;Electrical Stimulation;Stair training;Functional mobility training;Therapeutic activities;Therapeutic exercise;Balance training;Neuromuscular re-education;Patient/family education;Orthotic Fit/Training    PT Next Visit Plan  Plan for d/c at next visit.  Check goals.  Give education materal on desensitization/nerve pain.    PT Home Exercise Plan  Access Code: [email or web address] URL: [email or web address] Date: 01/05/2020 Prepared by: Nita Sells    Consulted and Agree with Plan of Care  Patient       Patient will benefit from skilled therapeutic intervention in order to improve the following deficits and impairments:  Abnormal gait, Decreased balance, Decreased mobility, Difficulty walking, Decreased range of motion, Decreased strength, Impaired sensation, Impaired flexibility  Visit Diagnosis: Muscle  weakness (generalized)  Unsteadiness on feet  Other abnormalities of gait and mobility     Problem List Patient Active Problem List   Diagnosis Date Noted  . Smoker 05/06/2016  . Bipolar disorder ([place]) 05/06/2016    Narda Bonds, PTA [facility] 02/20/20 11:29 AM Phone: [phone] Fax: [facility] [address] [place], [place], [address] Phone: [phone]   Fax:  [phone]  Name: [name] MRN: [id number] Date of Birth: [birth year]

## 2020-02-20 NOTE — Patient Instructions (Signed)
  Strengthening: Resisted Flexion   Hold tubing with _one____ arm(s) at side. Pull forward and up. Move shoulder through pain-free range of motion. Repeat __10__ times per set.  Do _1-2_ sessions per day , every other day   Strengthening: Resisted Extension   Hold tubing in __one___ hand(s), arm forward. Pull arm back, elbow straight. Repeat _15___ times per set. Do _1-2___ sessions per day, every other day.   Resisted Horizontal Abduction: Bilateral   Sit or stand, tubing in both hands, arms out in front. Keeping arms straight, pinch shoulder blades together and stretch arms out. Repeat _15__ times per set. Do _1-2___ sessions per day, every other day.   Elbow Flexion: Resisted   With tubing held in ___one___ hand(s) and other end secured under foot, curl arm up as far as possible. Repeat _15__ times per set. Do _1-2___ sessions per day, every other day.    Elbow Extension: Resisted   Sit in chair with resistive band secured at armrest (or hold with other hand) and ___one____ elbow bent. Straighten elbow. Repeat _15__ times per set.  Do _1-2___ sessions per day, every other day.   Copyright  VHI. All rights reserved.

## 2020-02-20 NOTE — Patient Instructions (Signed)
Feet Together (Compliant Surface) Head Motion - Eyes Closed    Stand on compliant surface: Pillow or foam with feet together. Close eyes and move head slowly, up and down. Repeat 10____ times per session. Do 2____ sessions per day.  Copyright  VHI. All rights reserved.  Feet Apart (Compliant Surface) Head Motion - Eyes Closed    Stand on compliant surface: Pillow or foam________ with feet shoulder width apart. Close eyes and move head slowly, up and down. Repeat 10____ times per session. Do _2___ sessions per day.  Copyright  VHI. All rights reserved.

## 2020-02-27 ENCOUNTER — Encounter: Payer: Self-pay | Admitting: Occupational Therapy

## 2020-02-27 ENCOUNTER — Other Ambulatory Visit: Payer: Self-pay

## 2020-02-27 ENCOUNTER — Ambulatory Visit: Payer: Medicaid Other | Admitting: Occupational Therapy

## 2020-02-27 ENCOUNTER — Ambulatory Visit: Payer: Medicaid Other | Attending: Family Medicine | Admitting: Physical Therapy

## 2020-02-27 DIAGNOSIS — M25642 Stiffness of left hand, not elsewhere classified: Secondary | ICD-10-CM | POA: Diagnosis present

## 2020-02-27 DIAGNOSIS — M25632 Stiffness of left wrist, not elsewhere classified: Secondary | ICD-10-CM | POA: Diagnosis present

## 2020-02-27 DIAGNOSIS — R209 Unspecified disturbances of skin sensation: Secondary | ICD-10-CM | POA: Insufficient documentation

## 2020-02-27 DIAGNOSIS — R2689 Other abnormalities of gait and mobility: Secondary | ICD-10-CM | POA: Insufficient documentation

## 2020-02-27 DIAGNOSIS — M6281 Muscle weakness (generalized): Secondary | ICD-10-CM

## 2020-02-27 DIAGNOSIS — R208 Other disturbances of skin sensation: Secondary | ICD-10-CM

## 2020-02-27 DIAGNOSIS — R278 Other lack of coordination: Secondary | ICD-10-CM

## 2020-02-27 DIAGNOSIS — R29818 Other symptoms and signs involving the nervous system: Secondary | ICD-10-CM

## 2020-02-27 DIAGNOSIS — R2681 Unsteadiness on feet: Secondary | ICD-10-CM | POA: Diagnosis present

## 2020-02-27 NOTE — Patient Instructions (Signed)
Your Splint This splint should initially be fitted by a healthcare practitioner.  The healthcare practitioner is responsible for providing wearing instructions and precautions to the patient, other healthcare practitioners and care provider involved in the patient's care.  This splint was custom made for you. Please read the following instructions to learn about wearing and caring for your splint.  Precautions Should your splint cause any of the following problems, remove the splint immediately and contact your therapist/physician.  Swelling  Severe Pain  Pressure Areas  Stiffness  Numbness  Do not wear your splint while operating machinery unless it has been fabricated for that purpose.  When To Wear Your Splint Where your splint according to your therapist/physician instructions. Wear during the day as tolerated (check skin frequently the first few days)  Care and Cleaning of Your Splint 1. Keep your splint away from open flames. 2. Your splint will lose its shape in temperatures over 135 degrees Farenheit, ( in car windows, near radiators, ovens or in hot water).  Never make any adjustments to your splint, if the splint needs adjusting remove it and make an appointment to see your therapist. 3. Your splint, including the cushion liner may be cleaned with soap and lukewarm water.  Do not immerse in hot water over 135 degrees Farenheit. 4. Straps may be washed with soap and water, but do not moisten the self-adhesive portion. 5. For ink or hard to remove spots use a scouring cleanser which contains chlorine.  Rinse the splint thoroughly after using chlorine cleanser.

## 2020-02-27 NOTE — Therapy (Signed)
[facility] [address] [place], [place], [address] Phone: [phone]   Fax:  [phone]  Occupational Therapy Treatment  Patient Details  Name: [name] MRN: [id number] Date of Birth: 12/17/1970 Referring Provider (OT): Dr. Horald Pollen   Encounter Date: 02/27/2020  OT End of Session - 02/27/20 0847    Visit Number  9    Number of Visits  9    Date for OT Re-Evaluation  02/18/20    Authorization Type  Medicaid    Authorization Time Period  8 visits through 02/29/20    Authorization - Visit Number  8    Authorization - Number of Visits  8    OT Start Time  0805    OT Stop Time  0843    OT Time Calculation (min)  38 min    Activity Tolerance  Patient tolerated treatment well    Behavior During Therapy  [facility] for tasks assessed/performed       Past Medical History:  Diagnosis Date  . Bipolar 1 disorder ([place])    followed by psychiatry  . Chronic kidney disease    hx of short term dialysis  . 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 ETOH abuse   . Foot drop, left   . Foot drop, left foot   . Polysubstance abuse ([place])    history of  . Smoker   . Swallowing difficulty    cannot swallow pills  . Teeth decayed   . Vitamin D deficiency     Past Surgical History:  Procedure Laterality Date  . arm surgery Left    compartment syndrome surgeries  . CESAREAN SECTION     fetal distress  . HAND SURGERY     for fracture  . TONSILLECTOMY    . TOOTH EXTRACTION Bilateral 10/26/2018   Procedure: MULTIPLE EXTRACTION # 18, 21, 22, 27, 28;  Surgeon: Diona Browner, DDS;  Location: [facility];  Service: Oral Surgery;  Laterality: Bilateral;    There were no vitals filed for this visit.  Subjective Assessment - 02/27/20 0814    Subjective   Pt reports continued difficulty with L thumb opposition and pain    Patient Stated Goals  I would like to be able to use my hand correctly, hold things     Currently in Pain?  Yes    Pain Score  4     Pain Location  --   thumb   Pain Orientation  Left    Pain Descriptors / Indicators  Aching    Pain Type  Chronic pain    Pain Onset  In the past 7 days    Pain Frequency  Intermittent    Aggravating Factors   opposition to 4-5th digits    Pain Relieving Factors  rest, splint          Splinting:  Fabricated L short opponens splint for improved tip pinch and opposition to each digit as well as decr pain.  Pt reports no pain with opposition to 5th digit with splint and improved tip pinch.  Picking up coins and stacking with splint with min cueing and improved performance.    Checked remaining goal (9-hole peg test)--see below.         OT Education - 02/27/20 0848    Education Details  splint wear/care and precautions review (has had splint previously)    Person(s) Educated  Patient    Methods  Explanation;Demonstration;Verbal cues  Comprehension  Verbalized understanding       OT Short Term Goals - 02/27/20 8315      OT SHORT TERM GOAL #1   Title  Pt will be independent with HEP for incr coordination.--check STGs 01/20/20    Baseline  dependent    Time  4    Period  Weeks    Status  Achieved      OT SHORT TERM GOAL #2   Title  Pt will incr L grip strength by at least 5lbs for incr ease for grasping/lifting objects.    Baseline  32.8lbs    Time  4    Period  Weeks    Status  Achieved   39.6lbs     OT SHORT TERM GOAL #3   Title  Pt will improve lateral and 3point pinch strength by at least 2lbs each for incr ease with ADLs.    Baseline  lateral-8lbs, 3point 4lbs    Time  4    Period  Weeks    Status  Achieved   Lateral 10 lbs, 3 pt 8 lbs     OT SHORT TERM GOAL #4   Title  Pt wil improve L hand coordination for ADLs as shown by improving time on 9-hole peg test by at least 8sec.    Baseline  32.18sec    Time  4    Period  Weeks    Status  Partially Met   L 9 hole 26.85, 25.50.  02/27/20:  27.15sec, 23.50sec      OT SHORT TERM GOAL #5   Title  ---        OT Long Term Goals - 02/20/20 1241      OT LONG TERM GOAL #1   Title  Pt will be independent with strengthening HEP.--check LTGs 02/17/20    Baseline  dependent    Time  8    Period  Weeks    Status  Achieved      OT LONG TERM GOAL #2   Title  Pt improve L hand grip strength by at least 10lbs to assist in lifting/gripping objects.    Baseline  32.8lbs    Time  8    Period  Weeks    Status  Achieved   45.8 , 42.9     OT LONG TERM GOAL #3   Title  Pt will be able to retrieve 3lb object from overhead shelf safely with LUE.    Baseline  unable    Time  8    Period  Weeks    Status  Achieved      OT LONG TERM GOAL #4   Title  Pt will report use LUE as nondominant assist at least 90% of the time for ADLs/IADLs.    Baseline  pt reports that she typically does tasks with RUE only.    Time  8    Period  Weeks    Status  Achieved      OT LONG TERM GOAL #5   Title  --            Plan - 02/27/20 0859    Clinical Impression Statement  Pt has made good progress and demo improved opposition with short oppens splint with incr ease using tip pinch.    OT Occupational Profile and History  Detailed Assessment- Review of Records and additional review of physical, cognitive, psychosocial history related to current functional performance    Occupational performance deficits (Please refer to  evaluation for details):  ADL's;IADL's    Body Structure / Function / Physical Skills  ADL;Dexterity;ROM;IADL;Balance;Sensation;Strength;FMC;Coordination;UE functional use    Rehab Potential  Good    Clinical Decision Making  Several treatment options, min-mod task modification necessary    Comorbidities Affecting Occupational Performance:  May have comorbidities impacting occupational performance    Modification or Assistance to Complete Evaluation   Min-Moderate modification of tasks or assist with assess necessary to complete eval    OT Frequency   1x / week    OT Duration  8 weeks   +eval   OT Treatment/Interventions  Self-care/ADL training;Moist Heat;Fluidtherapy;DME and/or AE instruction;Splinting;Therapeutic activities;Aquatic Therapy;Ultrasound;Therapeutic exercise;Scar mobilization;Passive range of motion;Neuromuscular education;Cryotherapy;Electrical Stimulation;Paraffin;Manual Therapy;Patient/family education    Plan  d/c OT    Consulted and Agree with Plan of Care  Patient       Patient will benefit from skilled therapeutic intervention in order to improve the following deficits and impairments:   Body Structure / Function / Physical Skills: ADL, Dexterity, ROM, IADL, Balance, Sensation, Strength, FMC, Coordination, UE functional use       Visit Diagnosis: Muscle weakness (generalized)  Other lack of coordination  Other disturbances of skin sensation  Other symptoms and signs involving the nervous system  Stiffness of left hand, not elsewhere classified  Stiffness of left wrist, not elsewhere classified    Problem List Patient Active Problem List   Diagnosis Date Noted  . Smoker 05/06/2016  . Bipolar disorder ([place]) 05/06/2016    OCCUPATIONAL THERAPY DISCHARGE SUMMARY  Visits from Start of Care: 9  Current functional level related to goals / functional outcomes: See above   Remaining deficits: decr strength and coordination--improved, decr sensation, improved LUE functional use now   Education / Equipment: Pt instructed in HEP and splint wear care (custom short opponens splint fabricated).  Plan: Patient agrees to discharge.  Patient goals were met. Patient is being discharged due to meeting the stated rehab goals.  STG #4 partially met (inconsistent, but improved from initial). ?????        [facility] 02/27/2020, 9:00 AM  [facility] [address] [place], [place], [address] Phone: [phone]   Fax:  [phone]  Name:  SHYKERIA SAKAMOTO MRN: [id number] Date of Birth: Apr 12, 1971   Vianne Bulls, OTR/L [facility] [address] [place], [place]  [address] [phone] phone [phone] 02/27/20 9:00 AM

## 2020-02-27 NOTE — Therapy (Signed)
[facility] [address] [place], [place], [address] Phone: [phone]   Fax:  [phone]  Physical Therapy Treatment/Discharge Summary  Patient Details  Name: [name] MRN: [id number] Date of Birth: 05/12/1971 Referring Provider (PT): Horald Pollen   Encounter Date: 02/27/2020  PT End of Session - 02/27/20 1035    Visit Number  16    Number of Visits  16    Date for PT Re-Evaluation  --   recert 12/29/4156   Authorization Type  4 additional visits approved 2/12-3/11/21; 4 additional visits approved 02/06/2020-03/04/2020    Authorization - Visit Number  4    Authorization - Number of Visits  4    PT Start Time  3094    PT Stop Time  0925    PT Time Calculation (min)  41 min    Activity Tolerance  Patient tolerated treatment well    Behavior During Therapy  [facility] for tasks assessed/performed       Past Medical History:  Diagnosis Date  . Bipolar 1 disorder ([place])    followed by psychiatry  . Chronic kidney disease    hx of short term dialysis  . 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 ETOH abuse   . Foot drop, left   . Foot drop, left foot   . Polysubstance abuse ([place])    history of  . Smoker   . Swallowing difficulty    cannot swallow pills  . Teeth decayed   . Vitamin D deficiency     Past Surgical History:  Procedure Laterality Date  . arm surgery Left    compartment syndrome surgeries  . CESAREAN SECTION     fetal distress  . HAND SURGERY     for fracture  . TONSILLECTOMY    . TOOTH EXTRACTION Bilateral 10/26/2018   Procedure: MULTIPLE EXTRACTION # 18, 21, 22, 27, 28;  Surgeon: Diona Browner, DDS;  Location: [facility];  Service: Oral Surgery;  Laterality: Bilateral;    There were no vitals filed for this visit.  Subjective Assessment - 02/27/20 0845    Subjective  Nothing really new, though I can move my L foot up a little more.  Had a little swelling in my L lower  leg.    Pertinent History  PMH include bipolar, cellulits BLEs, rhabdomyolysis, ETOH and polysubstance abuse, L arm surgery due to compartment syndrome    Patient Stated Goals  Pt's goal for PT is to get all the function back in my L foot.    Currently in Pain?  No/denies         [facility] PT Assessment - 02/27/20 0912      Functional Gait  Assessment   Gait assessed   Yes    Gait Level Surface  Walks 20 ft in less than 7 sec but greater than 5.5 sec, uses assistive device, slower speed, mild gait deviations, or deviates 6-10 in outside of the 12 in walkway width.   6.4   Change in Gait Speed  Able to change speed, demonstrates mild gait deviations, deviates 6-10 in outside of the 12 in walkway width, or no gait deviations, unable to achieve a major change in velocity, or uses a change in velocity, or uses an assistive device.    Gait with Horizontal Head Turns  Performs head turns smoothly with slight change in gait velocity (eg, minor disruption to smooth gait path), deviates 6-10 in outside 12 in  walkway width, or uses an assistive device.    Gait with Vertical Head Turns  Performs task with slight change in gait velocity (eg, minor disruption to smooth gait path), deviates 6 - 10 in outside 12 in walkway width or uses assistive device    Gait and Pivot Turn  Pivot turns safely within 3 sec and stops quickly with no loss of balance.    Step Over Obstacle  Is able to step over one shoe box (4.5 in total height) without changing gait speed. No evidence of imbalance.    Gait with Narrow Base of Support  Ambulates 4-7 steps.    Gait with Eyes Closed  Walks 20 ft, slow speed, abnormal gait pattern, evidence for imbalance, deviates 10-15 in outside 12 in walkway width. Requires more than 9 sec to ambulate 20 ft.   9.9   Ambulating Backwards  Walks 20 ft, uses assistive device, slower speed, mild gait deviations, deviates 6-10 in outside 12 in walkway width.    Steps  Alternating feet, no rail.     Total Score  20       Therapeutic Exercise Reviewed pt's HEP given last visit, with pt standing on pillows in corner with feet apart/feet together, EC with head turns x 10, head nods x 10.  Pt return demo understanding.  Needs intermittent UE support.    Additional Corner balance exercises: Standing on pillow with feet partial tandem stance:  head turns x 10, head nods x 10 with eyes open and eyes closed.  Intermittent UE support at chair.            [facility] Adult PT Treatment/Exercise - 02/27/20 0912      Transfers   Transfers  Sit to Stand;Stand to Sit    Sit to Stand  6: Modified independent (Device/Increase time)    Stand to Sit  6: Modified independent (Device/Increase time)      Ambulation/Gait   Ambulation/Gait  Yes    Ambulation/Gait Assistance  6: Modified independent (Device/Increase time)    Ambulation/Gait Assistance Details  Decreased overall compensation noted through L hip during gait.    Assistive device  None    Gait Pattern  Step-through pattern;Decreased arm swing - left;Decreased step length - left;Decreased dorsiflexion - left;Decreased weight shift to left    Stairs  Yes    Stairs Assistance  6: Modified independent (Device/Increase time)    Stair Management Technique  No rails;Alternating pattern;Forwards    Number of Stairs  4   x 2   Height of Stairs  6    Gait Comments  6 Minute walk test:  1208 ft (improved from 1088 ft)      Self-Care   Self-Care  Other Self-Care Comments    Other Self-Care Comments   Discussed desensitization techniques for L foot (pt able to verbalize some of the things previously discussed last week); also discussed using different textures for rubbing, massaging L foot in addition to use of water bottle or ball to roll under L foot.  Discussed progress towards goals, POC, and plans for d/c this visit.   Pt in agreement.             PT Education - 02/27/20 1035    Education Details  See self-care; POC and progress  towards goals; d/c this visit    Person(s) Educated  Patient    Methods  Explanation    Comprehension  Verbalized understanding       PT Short Term Goals -  12/22/19 1606      PT SHORT TERM GOAL #1   Title  Patient will be independent with progression of HEP for LLE strengthening, stretching, and balance/gait.  (Target for all STGs 12/22/2019)    Baseline  need to continue to progress HEP and revise as pt's strength/balance improve    Time  3    Period  Weeks    Status  On-going      PT SHORT TERM GOAL #2   Title  Pt will perform 10 reps sit<>stand with no UE support, BLES in equal weightbearing position for improved functional strength and use of LLE.    Time  3    Period  Weeks    Status  Achieved      PT SHORT TERM GOAL #3   Title  Pt will improve single limb stance to at least 2 seconds on LLE for improved functional LLE strength.    Baseline  1.59 sec at best on LLE on 12/22/19.  >20 sec on RLE    Time  3    Period  Weeks    Status  Not Met        PT Long Term Goals - 02/27/20 0254      PT LONG TERM GOAL #1   Title  Patient will be independent with final progression of HEP and transition to community fitness. (Target for all LTGs:  4 weeks 03/01/2020 )    Baseline  Has exercise bike and is independent with HEP    Time  4    Period  Weeks    Status  Achieved      PT LONG TERM GOAL #2   Title  Pt will improve FGA score to at least 20/30 for decreased fall risk    Baseline  01/26/2020:  FGA 17/30 (Scores <22/30 indicate increased fall risk)    Time  4    Period  Weeks    Status  Achieved      PT LONG TERM GOAL #3   Title  Pt will improve 6 MWT distance to at least 1130 ft, to demonstrate improved gait endurance and functional strength.    Baseline  1208 ft in 6MWT 02/27/2020    Time  4    Period  Weeks    Status  Achieved            Plan - 02/27/20 1036    Clinical Impression Statement  Reviewed updates to HEP with pt return demo understanding.  Assessed LTGs,  with pt meeting 3 of 3 LTGs.  She needs intermittent UE support for balance on compliant surfaces with EC and head motions.  She has made significant improvements in gait; she still displays strength deficits LLE, but is improving slowly.  She verbalizes plans to continue HEP and to use exercise bike at home for aerobic activity.  Pt is appropriate for d/c at this time.    Personal Factors and Comorbidities  Comorbidity 3+;Behavior Pattern    Comorbidities  HX of bipolar, cellulits BLEs, rhabdomyolysis, ETOH and polysubstance abuse, L arm surgery due to compartment syndrome    Examination-Activity Limitations  Locomotion Level;Squat;Stairs;Stand    Examination-Participation Restrictions  Community Activity    Stability/Clinical Decision Making  Evolving/Moderate complexity    Rehab Potential  Good    PT Frequency  1x / week    PT Duration  4 weeks   per recert 12/30/621   PT Treatment/Interventions  ADLs/Self Care Home Management;Gait training;DME Instruction;Electrical Stimulation;Stair training;Functional  mobility training;Therapeutic activities;Therapeutic exercise;Balance training;Neuromuscular re-education;Patient/family education;Orthotic Fit/Training    PT Next Visit Plan  D/C this visit.    PT Home Exercise Plan  Access Code: [email or web address] URL: [email or web address] Date: 01/05/2020 Prepared by: Nita Sells    Consulted and Agree with Plan of Care  Patient       Patient will benefit from skilled therapeutic intervention in order to improve the following deficits and impairments:  Abnormal gait, Decreased balance, Decreased mobility, Difficulty walking, Decreased range of motion, Decreased strength, Impaired sensation, Impaired flexibility  Visit Diagnosis: Other abnormalities of gait and mobility  Unsteadiness on feet  Muscle weakness (generalized)     Problem List Patient Active Problem List   Diagnosis Date Noted  . Smoker 05/06/2016  . Bipolar disorder ([place])  05/06/2016    Ninah Moccio W. 02/27/2020, 10:39 AM Frazier Butt., PT  [facility] [address] [place], [place], [address] Phone: [phone]   Fax:  [phone]  Name: [name] MRN: [id number] Date of Birth: 1971/01/28   PHYSICAL THERAPY DISCHARGE SUMMARY  Visits from Start of Care: 16  Current functional level related to goals / functional outcomes: PT Long Term Goals - 02/27/20 0902      PT LONG TERM GOAL #1   Title  Patient will be independent with final progression of HEP and transition to community fitness. (Target for all LTGs:  4 weeks 03/01/2020 )    Baseline  Has exercise bike and is independent with HEP    Time  4    Period  Weeks    Status  Achieved      PT LONG TERM GOAL #2   Title  Pt will improve FGA score to at least 20/30 for decreased fall risk    Baseline  01/26/2020:  FGA 17/30 (Scores <22/30 indicate increased fall risk)    Time  4    Period  Weeks    Status  Achieved      PT LONG TERM GOAL #3   Title  Pt will improve 6 MWT distance to at least 1130 ft, to demonstrate improved gait endurance and functional strength.    Baseline  1208 ft in 6MWT 02/27/2020    Time  4    Period  Weeks    Status  Achieved      Pt has met all LTGs.    Remaining deficits: Decreased L ankle strength, high level balance   Education / Equipment: Pt has been educated in progression of HEP and demo understanding.  Plan: Patient agrees to discharge.  Patient goals were met. Patient is being discharged due to meeting the stated rehab goals.  ?????          Frazier Butt., PT

## 2020-09-04 ENCOUNTER — Encounter: Payer: Self-pay | Admitting: Physician Assistant

## 2020-09-25 ENCOUNTER — Ambulatory Visit: Payer: Medicaid Other | Admitting: Physician Assistant

## 2020-09-25 ENCOUNTER — Encounter: Payer: Self-pay | Admitting: Physician Assistant

## 2020-09-25 VITALS — BP 108/70 | HR 92 | Ht 62.0 in | Wt 116.0 lb

## 2020-09-25 DIAGNOSIS — K529 Noninfective gastroenteritis and colitis, unspecified: Secondary | ICD-10-CM

## 2020-09-25 DIAGNOSIS — Z1211 Encounter for screening for malignant neoplasm of colon: Secondary | ICD-10-CM | POA: Diagnosis not present

## 2020-09-25 MED ORDER — NA SULFATE-K SULFATE-MG SULF 17.5-3.13-1.6 GM/177ML PO SOLN: 1.0000 | Freq: Once | ORAL | 0 refills | Status: AC

## 2020-09-25 MED ORDER — SUTAB 1479-225-188 MG PO TABS: 1.0000 | ORAL_TABLET | Freq: Once | ORAL | 0 refills | Status: AC
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
## 2020-09-25 NOTE — Addendum Note (Signed)
Addended by: Horris Latino on: 09/25/2020 02:06 PM   Modules accepted: Orders

## 2020-09-25 NOTE — Patient Instructions (Addendum)
If you are age 49 or older, your body mass index should be between 23-30. Your Body mass index is 21.22 kg/m. If this is out of the aforementioned range listed, please consider follow up with your Primary Care Provider.  If you are age 30 or younger, your body mass index should be between 19-25. Your Body mass index is 21.22 kg/m. If this is out of the aformentioned range listed, please consider follow up with your Primary Care Provider.   Your provider has requested that you go to the basement level for lab work before leaving today. Press "B" on the elevator. The lab is located at the first door on the left as you exit the elevator.  You have been scheduled for a colonoscopy. Please follow written instructions given to you at your visit today.  Please pick up your prep supplies at the pharmacy within the next 1-3 days. If you use inhalers (even only as needed), please bring them with you on the day of your procedure.  Due to recent changes in healthcare laws, you may see the results of your imaging and laboratory studies on MyChart before your provider has had a chance to review them.  We understand that in some cases there may be results that are confusing or concerning to you. Not all laboratory results come back in the same time frame and the provider may be waiting for multiple results in order to interpret others.  Please give [place] 48 hours in order for your provider to thoroughly review all the results before contacting the office for clarification of your results.

## 2020-09-27 NOTE — Progress Notes (Signed)
____________________________________________________________  Attending physician addendum:  Thank you for sending this case to me. I have reviewed the entire note, and the outlined plan seems appropriate.  Diagnostic colonoscopy for the diarrhea.  Agree with booking procedure out some to get results of stool studies.  However, mid December is my next available given current schedule.   Wilfrid Lund, MD  ____________________________________________________________

## 2020-10-25 ENCOUNTER — Other Ambulatory Visit: Payer: Medicaid Other

## 2020-10-25 DIAGNOSIS — K529 Noninfective gastroenteritis and colitis, unspecified: Secondary | ICD-10-CM

## 2020-10-29 LAB — GI PROFILE, STOOL, PCR

## 2020-10-31 LAB — CLOSTRIDIUM DIFFICILE TOXIN B, QUALITATIVE, REAL-TIME PCR: Toxigenic C. Difficile by PCR: NOT DETECTED

## 2020-10-31 LAB — OVA AND PARASITE EXAMINATION
CONCENTRATE RESULT:: NONE SEEN
MICRO NUMBER:: 11274604
SPECIMEN QUALITY:: ADEQUATE
TRICHROME RESULT:: NONE SEEN

## 2020-10-31 LAB — FECAL LACTOFERRIN, QUANT
Fecal Lactoferrin: NEGATIVE
MICRO NUMBER:: 11274571
SPECIMEN QUALITY:: ADEQUATE

## 2020-10-31 LAB — PANCREATIC ELASTASE, FECAL: Pancreatic Elastase-1, Stool: >500 mcg/g

## 2020-11-01 ENCOUNTER — Other Ambulatory Visit: Payer: Self-pay | Admitting: Family Medicine

## 2020-11-07 ENCOUNTER — Encounter: Payer: Self-pay | Admitting: Gastroenterology

## 2020-11-07 ENCOUNTER — Other Ambulatory Visit: Payer: Self-pay

## 2020-11-07 ENCOUNTER — Ambulatory Visit (AMBULATORY_SURGERY_CENTER): Payer: Medicaid Other | Admitting: Gastroenterology

## 2020-11-07 VITALS — BP 113/68 | HR 73 | Temp 97.5°F | Resp 18 | Ht 62.0 in | Wt 116.0 lb

## 2020-11-07 DIAGNOSIS — D120 Benign neoplasm of cecum: Secondary | ICD-10-CM

## 2020-11-07 DIAGNOSIS — D125 Benign neoplasm of sigmoid colon: Secondary | ICD-10-CM

## 2020-11-07 DIAGNOSIS — K529 Noninfective gastroenteritis and colitis, unspecified: Secondary | ICD-10-CM

## 2020-11-07 HISTORY — PX: COLONOSCOPY: SHX174

## 2020-11-07 MED ORDER — SODIUM CHLORIDE 0.9 % IV SOLN: 500.0000 mL | Freq: Once | INTRAVENOUS | Status: DC

## 2020-11-07 NOTE — Op Note (Signed)
[facility] Patient Name: [name] Procedure Date: 11/07/2020 2:11 PM MRN: [id number] Endoscopist: [place]. Loletha Carrow , MD Age: 49 Referring MD:  Date of Birth: 09/09/1971 Gender: Female Account #: [id number] Procedure:                Colonoscopy Indications:              Chronic diarrhea Medicines:                Monitored Anesthesia Care Procedure:                Pre-Anesthesia Assessment:                           - Prior to the procedure, a History and Physical                            was performed, and patient medications and                            allergies were reviewed. The patient's tolerance of                            previous anesthesia was also reviewed. The risks                            and benefits of the procedure and the sedation                            options and risks were discussed with the patient.                            All questions were answered, and informed consent                            was obtained. Prior Anticoagulants: The patient has                            taken no previous anticoagulant or antiplatelet                            agents. ASA Grade Assessment: III - A patient with                            severe systemic disease. After reviewing the risks                            and benefits, the patient was deemed in                            satisfactory condition to undergo the procedure.                           After obtaining informed consent, the colonoscope  was passed under direct vision. Throughout the                            procedure, the patient's blood pressure, pulse, and                            oxygen saturations were monitored continuously. The                            Olympus PCF-H190DL ([id number]) Colonoscope was                            introduced through the anus and advanced to the the                            terminal ileum, with identification  of the                            appendiceal orifice and IC valve. The colonoscopy                            was somewhat difficult due to multiple diverticula                            in the colon and a tortuous colon. Successful                            completion of the procedure was aided by using                            manual pressure. The patient tolerated the                            procedure fairly well. The quality of the bowel                            preparation was good. The ileocecal valve,                            appendiceal orifice, and rectum were photographed. Scope In: 2:21:44 PM Scope Out: 2:38:12 PM Scope Withdrawal Time: 0 hours 12 minutes 56 seconds  Total Procedure Duration: 0 hours 16 minutes 28 seconds  Findings:                 The perianal and digital rectal examinations were                            normal.                           The terminal ileum appeared normal.                           A 3-4 mm polyp was found in the ileocecal valve.  The polyp was semi-sessile. The polyp was removed                            with a cold biopsy forceps. Resection and retrieval                            were complete.                           Normal mucosa was found in the entire colon.                            Biopsies for histology were taken with a cold                            forceps from the right colon and left colon for                            evaluation of microscopic colitis.                           Multiple small-mouthed diverticula were found in                            the sigmoid colon.                           A 6 mm polyp was found in the distal sigmoid colon.                            The polyp was pedunculated. The polyp was removed                            with a hot snare. Resection and retrieval were                            complete.                           The exam was otherwise  without abnormality on                            direct and retroflexion views. Complications:            No immediate complications. Estimated Blood Loss:     Estimated blood loss was minimal. Impression:               - The examined portion of the ileum was normal.                           - One 3-4 mm polyp at the ileocecal valve, removed                            with a cold biopsy forceps. Resected and retrieved.                           -  Normal mucosa in the entire examined colon.                            Biopsied.                           - Diverticulosis in the sigmoid colon.                           - One 6 mm polyp in the distal sigmoid colon,                            removed with a hot snare. Resected and retrieved.                           - The examination was otherwise normal on direct                            and retroflexion views. Recommendation:           - Patient has a contact number available for                            emergencies. The signs and symptoms of potential                            delayed complications were discussed with the                            patient. Return to normal activities tomorrow.                            Written discharge instructions were provided to the                            patient.                           - Resume previous diet.                           - Continue present medications.                           - Await pathology results.                           - Repeat colonoscopy is recommended for                            surveillance. The colonoscopy date will be                            determined after pathology results from today's                            exam become  available for review. Tywon Niday L. Loletha Carrow, MD 11/07/2020 2:43:23 PM This report has been signed electronically.

## 2020-11-07 NOTE — Progress Notes (Signed)
Called to room to assist during endoscopic procedure.  Patient ID and intended procedure confirmed with present staff. Received instructions for my participation in the procedure from the performing physician.  

## 2020-11-07 NOTE — Patient Instructions (Signed)
Impression/Recommendations: ? ?Polyp and diverticulosis handouts given to patient. ? ?Resume previous diet. ?Continue present medications. ?Await pathology results. ? ?Repeat colonoscopy recommended for surveillance.  Date to be determined after pathology results reviewed. ? ?YOU HAD AN ENDOSCOPIC PROCEDURE TODAY AT THE [facility]:   Refer to the procedure report that was given to you for any specific questions about what was found during the examination.  If the procedure report does not answer your questions, please call your gastroenterologist to clarify.  If you requested that your care partner not be given the details of your procedure findings, then the procedure report has been included in a sealed envelope for you to review at your convenience later. ? ?YOU SHOULD EXPECT: Some feelings of bloating in the abdomen. Passage of more gas than usual.  Walking can help get rid of the air that was put into your GI tract during the procedure and reduce the bloating. If you had a lower endoscopy (such as a colonoscopy or flexible sigmoidoscopy) you may notice spotting of blood in your stool or on the toilet paper. If you underwent a bowel prep for your procedure, you may not have a normal bowel movement for a few days. ? ?Please Note:  You might notice some irritation and congestion in your nose or some drainage.  This is from the oxygen used during your procedure.  There is no need for concern and it should clear up in a day or so. ? ?SYMPTOMS TO REPORT IMMEDIATELY: ? ?Following lower endoscopy (colonoscopy or flexible sigmoidoscopy): ? Excessive amounts of blood in the stool ? Significant tenderness or worsening of abdominal pains ? Swelling of the abdomen that is new, acute ? Fever of 100?F or higher ? ?For urgent or emergent issues, a gastroenterologist can be reached at any hour by calling [phone]. ?Do not use MyChart messaging for urgent concerns.  ? ? ?DIET:  We do recommend a small meal at  first, but then you may proceed to your regular diet.  Drink plenty of fluids but you should avoid alcoholic beverages for 24 hours. ? ?ACTIVITY:  You should plan to take it easy for the rest of today and you should NOT DRIVE or use heavy machinery until tomorrow (because of the sedation medicines used during the test).   ? ?FOLLOW UP: ?Our staff will call the number listed on your records 48-72 hours following your procedure to check on you and address any questions or concerns that you may have regarding the information given to you following your procedure. If we do not reach you, we will leave a message.  We will attempt to reach you two times.  During this call, we will ask if you have developed any symptoms of COVID 19. If you develop any symptoms (ie: fever, flu-like symptoms, shortness of breath, cough etc.) before then, please call [phone].  If you test positive for Covid 19 in the 2 weeks post procedure, please call and report this information to us.   ? ?If any biopsies were taken you will be contacted by phone or by letter within the next 1-3 weeks.  Please call us at [phone] if you have not heard about the biopsies in 3 weeks.  ? ? ?SIGNATURES/CONFIDENTIALITY: ?You and/or your care partner have signed paperwork which will be entered into your electronic medical record.  These signatures attest to the fact that that the information above on your After Visit Summary has been reviewed and is understood.    Full responsibility of the confidentiality of this discharge information lies with you and/or your care-partner.

## 2020-11-07 NOTE — Progress Notes (Signed)
Report given to PACU, vss 

## 2020-11-07 NOTE — Progress Notes (Signed)
Vital signs checked by:SP  The medical and surgical history was reviewed and verified with the patient. 

## 2020-11-11 ENCOUNTER — Telehealth: Payer: Self-pay | Admitting: *Deleted

## 2020-11-11 NOTE — Telephone Encounter (Signed)
First attempt, no answer and VM hasn't been setup.

## 2020-11-11 NOTE — Telephone Encounter (Signed)
°  Follow up Call-  Call back number 11/07/2020  Post procedure Call Back phone  # [phone]  Permission to leave phone message Yes  Some recent data might be hidden     Patient questions:  Do you have a fever, pain , or abdominal swelling? No. Pain Score  0 *  Have you tolerated food without any problems? Yes.    Have you been able to return to your normal activities? Yes.    Do you have any questions about your discharge instructions: Diet   No. Medications  No. Follow up visit  No.  Do you have questions or concerns about your Care? No.  Actions: * If pain score is 4 or above: No action needed, pain <4.  1. Have you developed a fever since your procedure? no  2.   Have you had an respiratory symptoms (SOB or cough) since your procedure? no  3.   Have you tested positive for COVID 19 since your procedure no  4.   Have you had any family members/close contacts diagnosed with the COVID 19 since your procedure? no   If yes to any of these questions please route to Joylene John, RN and Joella Prince, RN

## 2020-11-15 ENCOUNTER — Encounter: Payer: Self-pay | Admitting: Gastroenterology

## 2020-11-18 ENCOUNTER — Other Ambulatory Visit: Payer: Self-pay

## 2020-11-18 MED ORDER — DICYCLOMINE HCL 10 MG PO CAPS: 20.0000 mg | ORAL_CAPSULE | Freq: Two times a day (BID) | ORAL | 0 refills | Status: DC | PRN

## 2020-11-18 MED ORDER — DICYCLOMINE HCL 20 MG PO TABS: 20.0000 mg | ORAL_TABLET | Freq: Two times a day (BID) | ORAL | 1 refills | Status: DC

## 2020-11-20 ENCOUNTER — Other Ambulatory Visit: Payer: Self-pay | Admitting: Family Medicine

## 2020-11-20 ENCOUNTER — Other Ambulatory Visit: Payer: Self-pay

## 2020-11-20 ENCOUNTER — Ambulatory Visit
Admission: RE | Admit: 2020-11-20 | Discharge: 2020-11-20 | Disposition: A | Discharge status: Home / Self Care | Payer: Medicaid Other | Source: Ambulatory Visit

## 2020-11-20 DIAGNOSIS — Z1231 Encounter for screening mammogram for malignant neoplasm of breast: Secondary | ICD-10-CM

## 2021-01-17 ENCOUNTER — Encounter: Payer: Self-pay | Admitting: Physician Assistant

## 2021-01-17 ENCOUNTER — Other Ambulatory Visit: Payer: Self-pay

## 2021-01-17 ENCOUNTER — Ambulatory Visit: Payer: Medicaid Other | Admitting: Physician Assistant

## 2021-01-17 VITALS — BP 100/62 | HR 88 | Ht 61.0 in | Wt 117.0 lb

## 2021-01-17 DIAGNOSIS — R935 Abnormal findings on diagnostic imaging of other abdominal regions, including retroperitoneum: Secondary | ICD-10-CM | POA: Diagnosis not present

## 2021-01-17 DIAGNOSIS — R140 Abdominal distension (gaseous): Secondary | ICD-10-CM | POA: Diagnosis not present

## 2021-01-17 DIAGNOSIS — K580 Irritable bowel syndrome with diarrhea: Secondary | ICD-10-CM

## 2021-01-17 NOTE — Patient Instructions (Signed)
If you are age 50 or older, your body mass index should be between 23-30. Your Body mass index is 22.11 kg/m. If this is out of the aforementioned range listed, please consider follow up with your Primary Care Provider.  If you are age 65 or younger, your body mass index should be between 19-25. Your Body mass index is 22.11 kg/m. If this is out of the aformentioned range listed, please consider follow up with your Primary Care Provider.   We have sent the following medications to your pharmacy for you to pick up at your convenience:  You will be contacted by [place] in the next 2 days to arrange a CT Abdomen Pelvis .  The number on your caller ID will be [phone], please answer when they call.  If you have not heard from them in 2 days please call [phone] to schedule.     Take dicyclomine capsule twice daily one in the morning and one at bedtime.  Thank you for choosing me and [facility].  Ellouise Newer, PA-C

## 2021-01-17 NOTE — Progress Notes (Signed)
Chief Complaint: Follow-up diarrhea and abnormal CT the abdomen  HPI:    [name] is a 50 year old female with a past medical history as listed below, known to Dr. Loletha Carrow, who returns to clinic today for follow-up of her diarrhea and complaints of an abnormal CT of the abdomen.    05/30/2018 CT of the abdomen and pelvis showed subcentimeter hypodense lesion in the right hepatic lobe which was too small to characterize but favored to be benign.    09/25/2020 patient seen in clinic by me for chronic diarrhea and some blood in her stools.  Describes that this is been going on for a year and she had loose stools which occurred at least 2 times a day.  Also seeing some bright red blood in the toilet paper with wiping.  At that time ordered stool studies include a GI pathogen panel, O&P, fecal lactoferrin and pancreatic fecal elastase.  These were all normal.  Patient then had a colonoscopy.    11/07/2020 colonoscopy with 1 3-4 mm polyp at the ileocecal valve which was adenomatous and one 6 mm polyp in the distal sigmoid colon which was adenomatous as well as diverticula in the sigmoid colon otherwise normal.  Repeat recommended 5 years.  It was thought her diarrhea is from IBS and she was sent in dicyclomine 10 mg capsules twice a day because she could not swallow pills.    Today, the patient presents to clinic and tells me that she never started the medicine that she was prescribed because it said it was for "abdominal spasms", and "I do not have spasms".  Tells me she had just thrown this in a drawer and just found it the other day.  Continues with loose diarrheal stools at least 2-4 times a day which have been unchanged since being seen last.  Also describes some bloating even after just eating small amounts of food which "cannot be normal".    Also has concerns today in regards to CT as above done during her hospital stay in 2019.  She wonders what is wrong with her liver.    Denies fever, chills,  weight loss, further blood in her stools, abdominal pain, change in bowel habits or symptoms that awaken her from sleep.  Past Medical History:  Diagnosis Date  . Bipolar 1 disorder ([place])    followed by psychiatry  . Chronic kidney disease    hx of short term dialysis  . 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 ETOH abuse   . Foot drop, left   . Foot drop, left foot   . Polysubstance abuse ([place])    history of  . Smoker   . Swallowing difficulty    cannot swallow pills  . Teeth decayed   . Vitamin D deficiency     Past Surgical History:  Procedure Laterality Date  . arm surgery Left    compartment syndrome surgeries  . CESAREAN SECTION     fetal distress  . COLONOSCOPY  11/07/2020  . HAND SURGERY     for fracture  . TONSILLECTOMY    . TOOTH EXTRACTION Bilateral 10/26/2018   Procedure: MULTIPLE EXTRACTION # 18, 21, 22, 27, 28;  Surgeon: Diona Browner, DDS;  Location: [facility];  Service: Oral Surgery;  Laterality: Bilateral;    Current Outpatient Medications  Medication Sig Dispense Refill  . FLUoxetine (PROZAC) 20 MG tablet Take 20 mg by mouth daily.    . traZODone (DESYREL) 50 MG  tablet Take 50 mg by mouth at bedtime.    Marland Kitchen VRAYLAR capsule Take 3 mg by mouth daily.    . baclofen (LIORESAL) 10 MG tablet Take 10 mg by mouth 3 (three) times daily. (Patient not taking: Reported on 01/17/2021)    . dicyclomine (BENTYL) 20 MG tablet Take 1 tablet (20 mg total) by mouth in the morning and at bedtime. (Patient not taking: Reported on 01/17/2021) 60 tablet 1   No current facility-administered medications for this visit.    Allergies as of 01/17/2021  . (No Known Allergies)    Family History  Problem Relation Age of Onset  . Cancer Mother        OVARIAN, NON-HODGKINS  . Colon cancer Neg Hx   . Pancreatic cancer Neg Hx   . Esophageal cancer Neg Hx   . Rectal cancer Neg Hx   . Stomach cancer Neg Hx     Social History   Socioeconomic History  . Marital  status: Single    Spouse name: Not on file  . Number of children: 2  . Years of education: Not on file  . Highest education level: Bachelor's degree (e.g., BA, AB, BS)  Occupational History  . Not on file  Tobacco Use  . Smoking status: Current Every Day Smoker    Packs/day: 1.00    Types: Cigarettes  . Smokeless tobacco: Never Used  Vaping Use  . Vaping Use: Never used  Substance and Sexual Activity  . Alcohol use: Yes    Comment: beer 2-4 per day  . Drug use: Not Currently    Types: Cocaine, Marijuana, "Crack" cocaine    Comment: LSD- last 1 week ago, 10/25/18 unsure the last time she used   . Sexual activity: Yes    Birth control/protection: None  Other Topics Concern  . Not on file  Social History Narrative   Lives with youngest son, house guest   Caffeine- coffee, 1 cup daily   Social Determinants of Health   Financial Resource Strain: Not on file  Food Insecurity: Not on file  Transportation Needs: Not on file  Physical Activity: Not on file  Stress: Not on file  Social Connections: Not on file  Intimate Partner Violence: Not on file    Review of Systems:    Constitutional: No weight loss, fever or chills Cardiovascular: No chest pain  Respiratory: No SOB Gastrointestinal: See HPI and otherwise negative   Physical Exam:  Vital signs: BP 100/62   Pulse 88   Ht '5\' 1"'  (1.549 m)   Wt 117 lb (53.1 kg)   LMP  (LMP Unknown)   BMI 22.11 kg/m   Constitutional:   Pleasant Caucasian female appears to be in NAD, Well developed, Well nourished, alert and cooperative.  Respiratory: Respirations even and unlabored. Lungs clear to auscultation bilaterally.   No wheezes, crackles, or rhonchi.  Cardiovascular: Normal S1, S2. No MRG. Regular rate and rhythm. No peripheral edema, cyanosis or pallor.  Gastrointestinal:  Soft, mild increase in abdominal distension, nontender. No rebound or guarding. Normal bowel sounds. No appreciable masses or hepatomegaly. Rectal:  Not  performed.  Psychiatric:  Demonstrates good judgement and reason without abnormal affect or behaviors.  RELEVANT LABS AND IMAGING: CBC    Component Value Date/Time   WBC 12.4 (H) 01/27/2008 0520   RBC 3.13 (L) 01/27/2008 0520   HGB 10.2 (L) 01/27/2008 0520   HCT 28.8 (L) 01/27/2008 0520   PLT 215 01/27/2008 0520   MCV 92.1 01/27/2008  0520   MCHC 35.3 01/27/2008 0520   RDW 12.8 01/27/2008 0520    CMP     Component Value Date/Time   NA 135 12/15/2007 1918   K 3.2 (L) 12/15/2007 1918   CL 102 12/15/2007 1918   CO2 25 12/15/2007 1918   GLUCOSE 100 (H) 12/15/2007 1918   BUN 4 (L) 12/15/2007 1918   CREATININE 0.66 12/15/2007 1918   CALCIUM 8.6 12/15/2007 1918   PROT 5.5 (L) 12/15/2007 1918   ALBUMIN 2.7 (L) 12/15/2007 1918   AST 17 12/15/2007 1918   ALT 14 12/15/2007 1918   ALKPHOS 93 12/15/2007 1918   BILITOT 0.4 12/15/2007 1918   GFRNONAA >60 12/15/2007 1918   GFRAA  12/15/2007 1918    >60        The eGFR has been calculated using the MDRD equation. This calculation has not been validated in all clinical    Assessment: 1.  Chronic diarrhea: Negative stool studies and colonoscopy, most likely IBS-D 2.  Abnormal CT of the liver: In 2019 during hospital stay showing a subcentimeter lesion; most likely benign 3.  Abdominal distention  Plan: 1.  Ordered CT the abdomen pelvis with contrast for further evaluation. 2.  Recommend the patient schedule her Dicyclomine 10 mg twice daily, every morning and 20 to 30 minutes before dinner if she can remember. 3.  Discussed with patient that she should call in the next 1 to 2 weeks and let me know how she is doing with Dicyclomine as above.  If this is not very helpful then can try increasing. 4.  Patient to follow in clinic per recommendations after imaging above.  Ellouise Newer, PA-C [facility] 01/17/2021, 9:12 AM  Cc: Hayden Rasmussen, MD

## 2021-01-21 NOTE — Progress Notes (Signed)
____________________________________________________________  Attending physician addendum:  Thank you for sending this case to me. I have reviewed the entire note and agree with the plan.  However, since the CT scan is to follow-up a previously scribed liver lesion, please be sure it is a CT of the liver with and without contrast.  Regarding her abdominal pain and diarrhea, if she does not get much improvement from dicyclomine, then please give her a 10-day course of rifaximin.  Wilfrid Lund, MD  ____________________________________________________________

## 2021-01-22 ENCOUNTER — Other Ambulatory Visit: Payer: Self-pay

## 2021-01-22 DIAGNOSIS — R140 Abdominal distension (gaseous): Secondary | ICD-10-CM

## 2021-01-22 DIAGNOSIS — K580 Irritable bowel syndrome with diarrhea: Secondary | ICD-10-CM

## 2021-01-22 DIAGNOSIS — R935 Abnormal findings on diagnostic imaging of other abdominal regions, including retroperitoneum: Secondary | ICD-10-CM

## 2021-01-28 ENCOUNTER — Ambulatory Visit (HOSPITAL_COMMUNITY)
Admission: RE | Admit: 2021-01-28 | Discharge: 2021-01-28 | Disposition: A | Discharge status: Home / Self Care | Payer: Medicaid Other | Source: Ambulatory Visit | Attending: Physician Assistant | Admitting: Physician Assistant

## 2021-01-28 ENCOUNTER — Other Ambulatory Visit: Payer: Self-pay

## 2021-01-28 DIAGNOSIS — K580 Irritable bowel syndrome with diarrhea: Secondary | ICD-10-CM

## 2021-01-28 DIAGNOSIS — R140 Abdominal distension (gaseous): Secondary | ICD-10-CM | POA: Diagnosis present

## 2021-01-28 DIAGNOSIS — R935 Abnormal findings on diagnostic imaging of other abdominal regions, including retroperitoneum: Secondary | ICD-10-CM

## 2021-02-05 ENCOUNTER — Ambulatory Visit: Payer: Medicaid Other | Attending: Family Medicine

## 2021-02-05 ENCOUNTER — Other Ambulatory Visit: Payer: Self-pay

## 2021-02-05 DIAGNOSIS — M6281 Muscle weakness (generalized): Secondary | ICD-10-CM | POA: Diagnosis not present

## 2021-02-05 DIAGNOSIS — R208 Other disturbances of skin sensation: Secondary | ICD-10-CM | POA: Diagnosis present

## 2021-02-05 DIAGNOSIS — R2681 Unsteadiness on feet: Secondary | ICD-10-CM | POA: Diagnosis present

## 2021-02-05 DIAGNOSIS — R278 Other lack of coordination: Secondary | ICD-10-CM | POA: Diagnosis present

## 2021-02-05 DIAGNOSIS — R2689 Other abnormalities of gait and mobility: Secondary | ICD-10-CM | POA: Diagnosis present

## 2021-02-05 NOTE — Therapy (Signed)
[place], [place], [address] Phone: [phone]   Fax:  [phone]  Physical Therapy Evaluation  Patient Details  Name: [name] MRN: [id number] Date of Birth: 12-29-1970 Referring Provider (PT): Hayden Rasmussen,   Encounter Date: 02/05/2021   PT End of Session - 02/05/21 1314    Visit Number 1    Number of Visits 17    Date for PT Re-Evaluation 04/05/21    PT Start Time 1240    PT Stop Time 1315    PT Time Calculation (min) 35 min    Activity Tolerance Patient tolerated treatment well    Behavior During Therapy [facility] for tasks assessed/performed           Past Medical History:  Diagnosis Date  . Bipolar 1 disorder ([place])    followed by psychiatry  . Chronic kidney disease    hx of short term dialysis  . 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 ETOH abuse   . Foot drop, left   . Foot drop, left foot   . Polysubstance abuse ([place])    history of  . Smoker   . Swallowing difficulty    cannot swallow pills  . Teeth decayed   . Vitamin D deficiency     Past Surgical History:  Procedure Laterality Date  . arm surgery Left    compartment syndrome surgeries  . CESAREAN SECTION     fetal distress  . COLONOSCOPY  11/07/2020  . HAND SURGERY     for fracture  . TONSILLECTOMY    . TOOTH EXTRACTION Bilateral 10/26/2018   Procedure: MULTIPLE EXTRACTION # 18, 21, 22, 27, 28;  Surgeon: Diona Browner, DDS;  Location: [facility];  Service: Oral Surgery;  Laterality: Bilateral;    There were no vitals filed for this visit.    Subjective Assessment - 02/05/21 1242    Subjective Patient reports a fall a couple years down the stairs and was unconscious and reports her Lt side was paralyzed from this fall. She has previously completed PT for Lt foot drop, but feels her strength and mobility has decreased. She reports her pinky toe is starting to turn in because she can't move  her toes. She has and AFO, but is not wearing is because it's painful. Patient denies any falls in the past 6 months. She reports pain along the lateral lower leg/ankle currently.    Pertinent History See PMH above    Limitations Lifting;Standing;Walking;House hold activities    How long can you sit comfortably? sitting is fine    How long can you stand comfortably? not really ever comfortable    How long can you walk comfortably? 5 steps    Patient Stated Goals I want to be able to wear shoes that aren't tennis shoes, and walk with normal gait, and decrease pain    Currently in Pain? Yes    Pain Score 3     Pain Location Ankle    Pain Orientation Left    Pain Descriptors / Indicators Dull    Pain Type Chronic pain    Pain Onset More than a month ago    Pain Frequency Constant    Aggravating Factors  walking, standing    Pain Relieving Factors rest              [facility] PT Assessment - 02/05/21 0001      Assessment   Medical Diagnosis Foot  drop, unspecified foot    Referring Provider (PT) Hayden Rasmussen,    Hand Dominance Right    Next MD Visit n/a    Prior Therapy yes      Precautions   Precautions Fall      Restrictions   Weight Bearing Restrictions No      Balance Screen   Has the patient fallen in the past 6 months No      [place] residence    Living Arrangements Non-relatives/Friends    [place] - 2 wheels;Cane - quad;Wheelchair - manual    Additional Comments stairs; handrails on both sides      Prior Function   Vocation Unemployed      Cognition   Overall Cognitive Status Within Functional Limits for tasks assessed      Observation/Other Assessments   Focus on Therapeutic Outcomes (FOTO)  NA MCD      Sensation   Light Touch Impaired by gross assessment      Strength   Right Hip Flexion 4+/5    Right Hip Extension 3+/5    Right Hip ABduction 4/5    Left Hip Flexion 4+/5    Left Hip Extension 3+/5     Left Hip ABduction 3+/5    Right Knee Flexion 5/5    Right Knee Extension 5/5    Left Knee Flexion 3+/5    Left Knee Extension 4+/5    Right Ankle Dorsiflexion 5/5    Right Ankle Plantar Flexion 5/5    Right Ankle Inversion 5/5    Right Ankle Eversion 5/5    Left Ankle Dorsiflexion 3-/5    Left Ankle Plantar Flexion 3+/5   in sitting   Left Ankle Inversion 3-/5    Left Ankle Eversion 3+/5      Ambulation/Gait   Ambulation/Gait Yes    Gait Pattern Step-through pattern;Left hip hike      Balance   Balance Assessed Yes      Static Standing Balance   Static Standing - Comment/# of Minutes 1 sec on Lt; 20 sec on Rt      Standardized Balance Assessment   Standardized Balance Assessment Dynamic Gait Index      Dynamic Gait Index   Level Surface Mild Impairment    Change in Gait Speed Mild Impairment    Gait with Horizontal Head Turns Moderate Impairment    Gait with Vertical Head Turns Normal    Gait and Pivot Turn Moderate Impairment    Step Over Obstacle Moderate Impairment    Step Around Obstacles Normal    Steps Normal    Total Score 16      Functional Gait  Assessment   Gait assessed  No                      Objective measurements completed on examination: See above findings.               PT Education - 02/05/21 1617    Education Details Education on current condition, POC, HEP.    Person(s) Educated Patient    Methods Explanation;Demonstration;Handout;Verbal cues    Comprehension Verbalized understanding;Returned demonstration;Verbal cues required            PT Short Term Goals - 02/05/21 1553      PT SHORT TERM GOAL #1   Title Patient will be independent with initial HEP.    Baseline issued at eval.  Time 3    Period Weeks    Status New    Target Date 02/26/21      PT SHORT TERM GOAL #2   Title Patient will perform SLS on LLE for at least 3 seconds to improve overall balance and safety with ambulation.    Baseline 1  second    Time 4    Period Weeks    Status New    Target Date 03/05/21      PT SHORT TERM GOAL #3   Title Patient will walk without hip hike.    Baseline Lt hip hike present    Time 4    Period Weeks    Status New    Target Date 03/05/21             PT Long Term Goals - 02/05/21 1606      PT LONG TERM GOAL #1   Title Patient will demonstrate at least 4/5 strength in bilateral hip abductors and extensors to improve stability about the chain with walking activity.    Baseline see flowsheet    Time 8    Period Weeks    Status New    Target Date 04/02/21      PT LONG TERM GOAL #2   Title Patinet will improve DGI to at least 19/24  for decreased fall risk    Baseline 16    Time 8    Period Weeks    Status New    Target Date 04/02/21      PT LONG TERM GOAL #3   Title Patient will complete reciprocal hurdle step overs x4 without LOB to improve safety with community ambulation    Baseline LOB with step overs as part of DGI assessment.    Time 8    Period Weeks    Status New    Target Date 04/02/21                  Plan - 02/05/21 1316    Clinical Impression Statement Patient is a 50 y/o female referred to OPPT for Lt foot drop as the result of a fall in 2019. She reports she was previously unable to move the LLE, but the movement has improved since her fall. She demonstrates weakness about the Lt ankle, Lt hamstring, and bilateral hips. Based upon DGI assessment she is at increased risk for falls and has notable gait and balance impairments. Patient will benefit from skilled PT to address above stated deficits in order to reduce risk of future falls and improve functional mobility.    Personal Factors and Comorbidities Time since onset of injury/illness/exacerbation;Past/Current Experience;Comorbidity 3+    Comorbidities bipolar, chronic kidney disease, compartment syndrome    Examination-Activity Limitations Locomotion  Level;Squat;Stairs;Stand;Transfers;Bend;Lift;Carry    Examination-Participation Restrictions Cleaning;Shop;Laundry    Stability/Clinical Decision Making Stable/Uncomplicated    Clinical Decision Making Low    Rehab Potential Good    PT Frequency --   1-2/week   PT Duration 8 weeks    PT Treatment/Interventions ADLs/Self Care Home Management;Cryotherapy;Moist Heat;Gait training;Stair training;Therapeutic activities;Therapeutic exercise;Balance training;Neuromuscular re-education;Patient/family education;Manual techniques    PT Next Visit Plan gait training, LLE strengthening focusing on hip, hamstring, and ankle. static balance, step overs    PT Home Exercise Plan Access Code: [email or web address]    Consulted and Agree with Plan of Care Patient           Patient will benefit from skilled therapeutic intervention in order to improve the following deficits and impairments:  Abnormal gait,Difficulty walking,Decreased activity tolerance,Pain,Decreased balance,Decreased strength  Visit Diagnosis: Muscle weakness (generalized)  Other abnormalities of gait and mobility  Unsteadiness on feet     Problem List Patient Active Problem List   Diagnosis Date Noted  . Smoker 05/06/2016  . Bipolar disorder ([place]) 05/06/2016   Gwendolyn Grant, PT, DPT, ATC 02/05/21 4:56 PM [facility] [address] [place], [place], [address] Phone: [phone]   Fax:  [phone]  Name: [name] MRN: [id number] Date of Birth: October 30, 1971

## 2021-02-19 ENCOUNTER — Other Ambulatory Visit: Payer: Self-pay

## 2021-02-19 ENCOUNTER — Encounter: Payer: Self-pay | Admitting: Rehabilitative and Restorative Service Providers"

## 2021-02-19 ENCOUNTER — Ambulatory Visit: Payer: Medicaid Other | Admitting: Rehabilitative and Restorative Service Providers"

## 2021-02-19 DIAGNOSIS — M6281 Muscle weakness (generalized): Secondary | ICD-10-CM

## 2021-02-19 DIAGNOSIS — R2689 Other abnormalities of gait and mobility: Secondary | ICD-10-CM

## 2021-02-19 DIAGNOSIS — R278 Other lack of coordination: Secondary | ICD-10-CM

## 2021-02-19 DIAGNOSIS — R208 Other disturbances of skin sensation: Secondary | ICD-10-CM

## 2021-02-19 DIAGNOSIS — R2681 Unsteadiness on feet: Secondary | ICD-10-CM

## 2021-02-19 NOTE — Therapy (Signed)
[place], [place], [address] Phone: [phone]   Fax:  [phone]  Physical Therapy Treatment  Patient Details  Name: [name] MRN: [id number] Date of Birth: [birth year] Referring Provider (PT): Hayden Rasmussen,   Encounter Date: 02/19/2021   PT End of Session - 02/19/21 1615    Visit Number 2    Number of Visits 17    Date for PT Re-Evaluation 04/05/21    PT Start Time 0345    PT Stop Time 0430    PT Time Calculation (min) 45 min    Activity Tolerance Patient tolerated treatment well;No increased pain    Behavior During Therapy WFL for tasks assessed/performed           Past Medical History:  Diagnosis Date  . Bipolar 1 disorder ([place])    followed by psychiatry  . Chronic kidney disease    hx of short term dialysis  . 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 ETOH abuse   . Foot drop, left   . Foot drop, left foot   . Polysubstance abuse ([place])    history of  . Smoker   . Swallowing difficulty    cannot swallow pills  . Teeth decayed   . Vitamin D deficiency     Past Surgical History:  Procedure Laterality Date  . arm surgery Left    compartment syndrome surgeries  . CESAREAN SECTION     fetal distress  . COLONOSCOPY  11/07/2020  . HAND SURGERY     for fracture  . TONSILLECTOMY    . TOOTH EXTRACTION Bilateral 10/26/2018   Procedure: MULTIPLE EXTRACTION # 18, 21, 22, 27, 28;  Surgeon: Diona Browner, DDS;  Location: [facility];  Service: Oral Surgery;  Laterality: Bilateral;    There were no vitals filed for this visit.   Subjective Assessment - 02/19/21 1545    Subjective I am doing fine. Just the normal stuff. I haven't been doing my exercises well at home. Been busy with my kid.    Currently in Pain? No/denies    Pain Score 0-No pain                             [facility] Adult PT Treatment/Exercise - 02/19/21 0001      Exercises    Exercises Lumbar;Knee/Hip;Ankle      Lumbar Exercises: Aerobic   Nustep level 3 x 5 min LEs only      Lumbar Exercises: Supine   Other Supine Lumbar Exercises bridge x 15      Knee/Hip Exercises: Standing   Other Standing Knee Exercises squats at counter x 15 with slight R lateral lean with PT verbal and visual cues for correction; R hip abdct x 20 followed by R hamstring curls x 20 to increase L SLS at countertop; L single limb squat at countertop x 15; L iso minisquat at countertop with R leg tapping in and out x 20; standing hamstring stretch with forward trunk lean over countertop 2x30 sec      Knee/Hip Exercises: Supine   Other Supine Knee/Hip Exercises SLR x 15 L LE; SLR/hip abdct combo L LE x 15      Knee/Hip Exercises: Sidelying   Other Sidelying Knee/Hip Exercises hip abdct x 15 L LE      Knee/Hip Exercises: Prone   Other Prone Exercises L glute set with hip ext  x 15, 2 lb L hamstring curl x 20 without cramping      Ankle Exercises: Standing   Other Standing Ankle Exercises standing double heel raises x 20 at counter; double toe raises at counter x 20                    PT Short Term Goals - 02/19/21 1619      PT SHORT TERM GOAL #1   Title Patient will be independent with initial HEP.    Status On-going      PT SHORT TERM GOAL #2   Title Patient will perform SLS on LLE for at least 3 seconds to improve overall balance and safety with ambulation.    Status On-going      PT SHORT TERM GOAL #3   Title Patient will walk without hip hike.    Status On-going             PT Long Term Goals - 02/05/21 1606      PT LONG TERM GOAL #1   Title Patient will demonstrate at least 4/5 strength in bilateral hip abductors and extensors to improve stability about the chain with walking activity.    Baseline see flowsheet    Time 8    Period Weeks    Status New    Target Date 04/02/21      PT LONG TERM GOAL #2   Title Patinet will improve DGI to at least 19/24   for decreased fall risk    Baseline 16    Time 8    Period Weeks    Status New    Target Date 04/02/21      PT LONG TERM GOAL #3   Title Patient will complete reciprocal hurdle step overs x4 without LOB to improve safety with community ambulation    Baseline LOB with step overs as part of DGI assessment.    Time 8    Period Weeks    Status New    Target Date 04/02/21                 Plan - 02/19/21 1615    Clinical Impression Statement Patient is a 50 y/o female referred to OPPT for Lt foot drop as the result of a fall in 2019. She reports she was previously unable to move the LLE, but the movement has improved since her fall. She demonstrates weakness about the Lt ankle, Lt hamstring, and bilateral hips.  Patient will benefit from skilled PT to address above stated deficits in order to reduce risk of future falls and improve functional mobility. pt experienced L LE cramps throughout treatment which lasted < 5 sec but she did need to change position to relieve cramp except with standing exercises. She was able to do all exercises with good form. PT discussed with pt compliance with HEP for maximization of PT benefit. No pain after tx; advised pt she may be sore in her glutes and quads after treatment.    PT Treatment/Interventions ADLs/Self Care Home Management;Cryotherapy;Moist Heat;Gait training;Stair training;Therapeutic activities;Therapeutic exercise;Balance training;Neuromuscular re-education;Patient/family education;Manual techniques    PT Next Visit Plan gait training, LLE strengthening focusing on hip, hamstring, and ankle. static balance, step overs    Consulted and Agree with Plan of Care Patient           Patient will benefit from skilled therapeutic intervention in order to improve the following deficits and impairments:  Abnormal gait,Difficulty walking,Decreased activity tolerance,Pain,Decreased balance,Decreased strength  Visit Diagnosis: Muscle weakness  (generalized)  Other abnormalities of gait and mobility  Unsteadiness on feet  Other lack of coordination  Other disturbances of skin sensation     Problem List Patient Active Problem List   Diagnosis Date Noted  . Smoker 05/06/2016  . Bipolar disorder ([place]) 05/06/2016    America Brown, PT 02/19/2021, 4:33 PM  [facility] [address] [place], [place], [address] Phone: [phone]   Fax:  [phone]  Name: [name] MRN: [id number] Date of Birth: [birth year]

## 2021-02-26 ENCOUNTER — Ambulatory Visit: Payer: Medicaid Other | Attending: Family Medicine | Admitting: Rehabilitative and Restorative Service Providers"

## 2021-02-26 ENCOUNTER — Other Ambulatory Visit: Payer: Self-pay

## 2021-02-26 ENCOUNTER — Encounter: Payer: Self-pay | Admitting: Rehabilitative and Restorative Service Providers"

## 2021-02-26 DIAGNOSIS — R278 Other lack of coordination: Secondary | ICD-10-CM | POA: Diagnosis present

## 2021-02-26 DIAGNOSIS — R2681 Unsteadiness on feet: Secondary | ICD-10-CM | POA: Diagnosis present

## 2021-02-26 DIAGNOSIS — R29818 Other symptoms and signs involving the nervous system: Secondary | ICD-10-CM | POA: Insufficient documentation

## 2021-02-26 DIAGNOSIS — M6281 Muscle weakness (generalized): Secondary | ICD-10-CM

## 2021-02-26 DIAGNOSIS — R208 Other disturbances of skin sensation: Secondary | ICD-10-CM | POA: Insufficient documentation

## 2021-02-26 DIAGNOSIS — R2689 Other abnormalities of gait and mobility: Secondary | ICD-10-CM | POA: Diagnosis present

## 2021-02-26 NOTE — Therapy (Signed)
[place], [place], [address] Phone: [phone]   Fax:  [phone]  Physical Therapy Treatment  Patient Details  Name: [name] MRN: [id number] Date of Birth: 1971-01-17 Referring Provider (PT): Hayden Rasmussen,   Encounter Date: 02/26/2021   PT End of Session - 02/26/21 1421    Visit Number 3    PT Start Time 0218    PT Stop Time 0300    PT Time Calculation (min) 42 min    Activity Tolerance Patient tolerated treatment well;No increased pain    Behavior During Therapy WFL for tasks assessed/performed           Past Medical History:  Diagnosis Date  . Bipolar 1 disorder ([place])    followed by psychiatry  . Chronic kidney disease    hx of short term dialysis  . 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 ETOH abuse   . Foot drop, left   . Foot drop, left foot   . Polysubstance abuse ([place])    history of  . Smoker   . Swallowing difficulty    cannot swallow pills  . Teeth decayed   . Vitamin D deficiency     Past Surgical History:  Procedure Laterality Date  . arm surgery Left    compartment syndrome surgeries  . CESAREAN SECTION     fetal distress  . COLONOSCOPY  11/07/2020  . HAND SURGERY     for fracture  . TONSILLECTOMY    . TOOTH EXTRACTION Bilateral 10/26/2018   Procedure: MULTIPLE EXTRACTION # 18, 21, 22, 27, 28;  Surgeon: Diona Browner, DDS;  Location: [facility];  Service: Oral Surgery;  Laterality: Bilateral;    There were no vitals filed for this visit.   Subjective Assessment - 02/26/21 1421    Subjective Doing fine. Just the normal discomfort. It's OK.    Currently in Pain? Yes    Pain Score 4     Pain Location Ankle    Pain Orientation Left    Pain Descriptors / Indicators Dull    Pain Type Chronic pain    Pain Onset More than a month ago    Pain Frequency Constant    Multiple Pain Sites No                              [facility] Adult PT Treatment/Exercise - 02/26/21 0001      Knee/Hip Exercises: Aerobic   Nustep level 4 x 5 min LEs only with PT reviewing goals and discussing HEP      Knee/Hip Exercises: Standing   Other Standing Knee Exercises L SLS trials at countertop multiple reps with pt able to make 3 sec with PT SBA; Airex squats x 20; L SLS 5x30 sec each; Airex L LE hip dip with R knee flexed x 20; Airex L with R LE stepping backwards on/off of pad x 20; L Airex with R LE stepping sideways off of pad x 20; Airex combo of R retro and lateral step downs x 20; Airex alternating toe raise x 10 for AROM followed by 20 heel raises bil; Airex L toe raise x 20. all performed at the countertop with pt using her hands lightly for balance.      Knee/Hip Exercises: Supine   Other Supine Knee/Hip Exercises bridge x 20 (with pt needing to rest in between for cramps x 1);  ball squeeze x 20 with 5 sec hold with PT educating pt on contraction prior to movement; L anterior tib contraction first and then active DF x 20; SLR x 20 each LE; alternating march x 20; Hamstring stretch 2x30 sec each LE      Knee/Hip Exercises: Prone   Other Prone Exercises alt hip ext with glute set x 20, donkey kick with AROM DF x 20 each side                    PT Short Term Goals - 02/26/21 1422      PT SHORT TERM GOAL #1   Title Patient will be independent with initial HEP.    Status On-going      PT SHORT TERM GOAL #2   Title Patient will perform SLS on LLE for at least 3 seconds to improve overall balance and safety with ambulation.    Status On-going      PT SHORT TERM GOAL #3   Title Patient will walk without hip hike.    Status On-going             PT Long Term Goals - 02/05/21 1606      PT LONG TERM GOAL #1   Title Patient will demonstrate at least 4/5 strength in bilateral hip abductors and extensors to improve stability about the chain with walking activity.    Baseline  see flowsheet    Time 8    Period Weeks    Status New    Target Date 04/02/21      PT LONG TERM GOAL #2   Title Patinet will improve DGI to at least 19/24  for decreased fall risk    Baseline 16    Time 8    Period Weeks    Status New    Target Date 04/02/21      PT LONG TERM GOAL #3   Title Patient will complete reciprocal hurdle step overs x4 without LOB to improve safety with community ambulation    Baseline LOB with step overs as part of DGI assessment.    Time 8    Period Weeks    Status New    Target Date 04/02/21                 Plan - 02/26/21 1422    Clinical Impression Statement Patient is a 50 y/o female referred to OPPT for Lt foot drop as the result of a fall in 2019. She reports moderate compliance with HEP but life gets in the way of consistent compliance.  She demonstrates weakness about the Lt ankle, Lt hamstring, and bilateral hips. Patient will benefit from skilled PT to address above stated deficits in order to reduce risk of future falls and improve functional mobility. pt experienced less L LE this treatment than prior treatments with proper muscle facilitation monitored. She was able to do all exercises with good form. PT discussed with pt compliance with HEP for maximization of PT benefit. No pain after tx; advised pt she may be sore in her glutes and quads after treatment.    PT Treatment/Interventions ADLs/Self Care Home Management;Cryotherapy;Moist Heat;Gait training;Stair training;Therapeutic activities;Therapeutic exercise;Balance training;Neuromuscular re-education;Patient/family education;Manual techniques    PT Next Visit Plan gait training, LLE strengthening focusing on hip, hamstring, and ankle. static balance, step overs    Consulted and Agree with Plan of Care Patient           Patient will benefit from skilled therapeutic  intervention in order to improve the following deficits and impairments:  Abnormal gait,Difficulty walking,Decreased  activity tolerance,Pain,Decreased balance,Decreased strength  Visit Diagnosis: Muscle weakness (generalized)  Other abnormalities of gait and mobility  Unsteadiness on feet  Other lack of coordination  Other disturbances of skin sensation  Other symptoms and signs involving the nervous system     Problem List Patient Active Problem List   Diagnosis Date Noted  . Smoker 05/06/2016  . Bipolar disorder ([place]) 05/06/2016    America Brown, PT, DPT 02/26/2021, 3:01 PM  [facility] [address] [place], [place], [address] Phone: [phone]   Fax:  [phone]  Name: SHAWNTE WINTON MRN: [id number] Date of Birth: 1971-07-11

## 2021-03-05 ENCOUNTER — Other Ambulatory Visit: Payer: Self-pay

## 2021-03-05 ENCOUNTER — Ambulatory Visit: Payer: Medicaid Other

## 2021-03-05 DIAGNOSIS — R2689 Other abnormalities of gait and mobility: Secondary | ICD-10-CM

## 2021-03-05 DIAGNOSIS — M6281 Muscle weakness (generalized): Secondary | ICD-10-CM

## 2021-03-05 DIAGNOSIS — R2681 Unsteadiness on feet: Secondary | ICD-10-CM

## 2021-03-05 NOTE — Therapy (Signed)
[place], [place], [address] Phone: [phone]   Fax:  [phone]  Physical Therapy Treatment/Progress Note  Patient Details  Name: [name] MRN: [id number] Date of Birth: 04/14/1971 Referring Provider (PT): Hayden Rasmussen,   Encounter Date: 03/05/2021   PT End of Session - 03/05/21 1502    Visit Number 4    Number of Visits 17    Date for PT Re-Evaluation 04/05/21    Authorization Type MCD    Authorization - Visit Number 3    Authorization - Number of Visits 3    PT Start Time 1502    PT Stop Time 1543    PT Time Calculation (min) 41 min    Activity Tolerance Patient tolerated treatment well    Behavior During Therapy [facility] for tasks assessed/performed           Past Medical History:  Diagnosis Date  . Bipolar 1 disorder ([place])    followed by psychiatry  . Chronic kidney disease    hx of short term dialysis  . 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 ETOH abuse   . Foot drop, left   . Foot drop, left foot   . Polysubstance abuse ([place])    history of  . Smoker   . Swallowing difficulty    cannot swallow pills  . Teeth decayed   . Vitamin D deficiency     Past Surgical History:  Procedure Laterality Date  . arm surgery Left    compartment syndrome surgeries  . CESAREAN SECTION     fetal distress  . COLONOSCOPY  11/07/2020  . HAND SURGERY     for fracture  . TONSILLECTOMY    . TOOTH EXTRACTION Bilateral 10/26/2018   Procedure: MULTIPLE EXTRACTION # 18, 21, 22, 27, 28;  Surgeon: Diona Browner, DDS;  Location: [facility];  Service: Oral Surgery;  Laterality: Bilateral;    There were no vitals filed for this visit.   Subjective Assessment - 03/05/21 1505    Subjective Patient reports it feels like she can't really move her Lt foot/toes at all. She denies any falls. She reports compliance with HEP.    Currently in Pain? Yes    Pain Score 2     Pain  Location Leg    Pain Orientation Left;Lower    Pain Descriptors / Indicators Dull    Pain Type Chronic pain    Pain Onset More than a month ago              [facility] PT Assessment - 03/05/21 0001      Ambulation/Gait   Gait Comments Lt lateral trunk lean, foot drop Lt                         [facility] Adult PT Treatment/Exercise - 03/05/21 0001      Neuro Re-ed    Neuro Re-ed Details  SLS stance RLE 30 sec, LLE trials of 1-3 seconds; tandem 3 x 30 sec RLE in rear 1x 10 sec then 1 x 20 sec then 1 x 30 sec LLE in rear, romberg on foam eyes closed 3 x 30 sec, tandem on foam 2 x30 sec each      Lumbar Exercises: Standing   Other Standing Lumbar Exercises heel/toe raises 2 x 10      Knee/Hip Exercises: Standing   Hip Abduction 10 reps    Abduction  Limitations x 2    Hip Extension 10 reps    Extension Limitations x 2    Other Standing Knee Exercises forward step overs focusing on maintain neutral trunk 2 x 10 each    Other Standing Knee Exercises marching 2 x 10; hamstring curl 2 x 10 1#                    PT Short Term Goals - 03/05/21 1508      PT SHORT TERM GOAL #1   Title Patient will be independent with initial HEP.    Status Achieved      PT SHORT TERM GOAL #2   Title Patient will perform SLS on LLE for at least 3 seconds to improve overall balance and safety with ambulation.    Baseline 3 seconds with multiple attempts    Status Achieved      PT SHORT TERM GOAL #3   Title Patient will walk without hip hike.    Baseline no hip hike present, lateral trunk flexion and foot drop present on the Lt    Status Achieved             PT Long Term Goals - 02/05/21 1606      PT LONG TERM GOAL #1   Title Patient will demonstrate at least 4/5 strength in bilateral hip abductors and extensors to improve stability about the chain with walking activity.    Baseline see flowsheet    Time 8    Period Weeks    Status New    Target Date 04/02/21      PT  LONG TERM GOAL #2   Title Patinet will improve DGI to at least 19/24  for decreased fall risk    Baseline 16    Time 8    Period Weeks    Status New    Target Date 04/02/21      PT LONG TERM GOAL #3   Title Patient will complete reciprocal hurdle step overs x4 without LOB to improve safety with community ambulation    Baseline LOB with step overs as part of DGI assessment.    Time 8    Period Weeks    Status New    Target Date 04/02/21                 Plan - 03/05/21 1503    Clinical Impression Statement Patient is making good functional progress having met established short term goals. Long term goals not assessed today given time frame established to meet these goals. Able to progress LE strengthening in standing  with patient requiring light UE support to maintain balance and maintain neutral spine. Patient did well with balance training with ability to appropriately implement hip/ankle strategy to maintain balance during narrow based and uneven surface balance activity. She will benefit from continuing with POC established at initial eval to address lingering strength, balance and gait impairments in order to optimize function.    PT Frequency --   1-2/week   PT Duration 8 weeks    PT Treatment/Interventions ADLs/Self Care Home Management;Cryotherapy;Moist Heat;Gait training;Stair training;Therapeutic activities;Therapeutic exercise;Balance training;Neuromuscular re-education;Patient/family education;Manual techniques    PT Next Visit Plan gait training, LLE strengthening focusing on hip, hamstring, and ankle. static balance, step overs    PT Home Exercise Plan Access Code: [email or web address]    Consulted and Agree with Plan of Care Patient           Patient will benefit from skilled  therapeutic intervention in order to improve the following deficits and impairments:  Abnormal gait,Difficulty walking,Decreased activity tolerance,Pain,Decreased balance,Decreased strength  Visit  Diagnosis: Muscle weakness (generalized)  Other abnormalities of gait and mobility  Unsteadiness on feet     Problem List Patient Active Problem List   Diagnosis Date Noted  . Smoker 05/06/2016  . Bipolar disorder ([place]) 05/06/2016   Gwendolyn Grant, PT, DPT, ATC 03/05/21 3:52 PM  [facility] [address] [place], [place], [address] Phone: [phone]   Fax:  [phone]  Name: [name] MRN: [id number] Date of Birth: Jul 15, 1971

## 2021-03-12 ENCOUNTER — Other Ambulatory Visit: Payer: Self-pay

## 2021-03-12 ENCOUNTER — Ambulatory Visit: Payer: Medicaid Other

## 2021-03-12 DIAGNOSIS — M6281 Muscle weakness (generalized): Secondary | ICD-10-CM | POA: Diagnosis not present

## 2021-03-12 DIAGNOSIS — R2681 Unsteadiness on feet: Secondary | ICD-10-CM

## 2021-03-12 DIAGNOSIS — R2689 Other abnormalities of gait and mobility: Secondary | ICD-10-CM

## 2021-03-12 NOTE — Therapy (Signed)
[place], [place], [address] Phone: [phone]   Fax:  [phone]  Physical Therapy Treatment  Patient Details  Name: [name] MRN: [id number] Date of Birth: May 10, 1971 Referring Provider (PT): Hayden Rasmussen,   Encounter Date: 03/12/2021   PT End of Session - 03/12/21 1454    Visit Number 5    Number of Visits 17    Date for PT Re-Evaluation 04/05/21    Authorization Type MCD    Authorization Time Period 4/20-6/14    Authorization - Visit Number 1    Authorization - Number of Visits 8    PT Start Time 7253   patient late   PT Stop Time 1532    PT Time Calculation (min) 38 min    Activity Tolerance Patient tolerated treatment well    Behavior During Therapy [facility] for tasks assessed/performed           Past Medical History:  Diagnosis Date  . Bipolar 1 disorder ([name])    followed by psychiatry  . Chronic kidney disease    hx of short term dialysis  . Compartment syndrome ([name])    left arm  . ETOH abuse   . Foot drop, left   . Foot drop, left foot   . Polysubstance abuse ([name])    history of  . Smoker   . Swallowing difficulty    cannot swallow pills  . Teeth decayed   . Vitamin D deficiency     Past Surgical History:  Procedure Laterality Date  . arm surgery Left    compartment syndrome surgeries  . CESAREAN SECTION     fetal distress  . COLONOSCOPY  11/07/2020  . HAND SURGERY     for fracture  . TONSILLECTOMY    . TOOTH EXTRACTION Bilateral 10/26/2018   Procedure: MULTIPLE EXTRACTION # 18, 21, 22, 27, 28;  Surgeon: [name], DDS;  Location: [facility];  Service: Oral Surgery;  Laterality: Bilateral;    There were no vitals filed for this visit.   Subjective Assessment - 03/12/21 1456    Subjective "I've had a lot of doctor appointments this week. I'm really Vitamin D deficient and I'll be getting B12 shots. My pain has been ok, not terrible.  Ortho prescribed me a TENS unit and told me to start taping my toe."    Currently in Pain? Yes    Pain Score 2     Pain Location Toe (Comment which one)   2   Pain Orientation Left    Pain Descriptors / Indicators [place]    Pain Type Chronic pain    Pain Onset More than a month ago    Pain Frequency Intermittent                             [facility] Adult PT Treatment/Exercise - 03/12/21 0001      Neuro Re-ed    Neuro Re-ed Details  balance board 2 x10 A/P and lateral, marching on airex 20 reps,tandem eyes closed multile trials each side      Lumbar Exercises: Aerobic   Nustep level 4 5 minutes UE/LE      Knee/Hip Exercises: Supine   Bridges with Clamshell 10 reps   x 2 green band     Knee/Hip Exercises: Sidelying   Hip ABduction 10 reps    Hip ABduction Limitations x2    Clams 2 x  10 green band      Ankle Exercises: Seated   Other Seated Ankle Exercises resisted ankle dorsflexion, inversion and eversion yellow band 1 x 10 each LLE      Ankle Exercises: Standing   Heel Raises 20 reps   on airex                 PT Education - 03/12/21 1550    Education Details Updated HEP.    Person(s) Educated Patient    Methods Explanation;Verbal cues;Handout;Demonstration;Tactile cues    Comprehension Verbalized understanding;Returned demonstration;Verbal cues required            PT Short Term Goals - 03/05/21 1508      PT SHORT TERM GOAL #1   Title Patient will be independent with initial HEP.    Status Achieved      PT SHORT TERM GOAL #2   Title Patient will perform SLS on LLE for at least 3 seconds to improve overall balance and safety with ambulation.    Baseline 3 seconds with multiple attempts    Status Achieved      PT SHORT TERM GOAL #3   Title Patient will walk without hip hike.    Baseline no hip hike present, lateral trunk flexion and foot drop present on the Lt    Status Achieved             PT Long Term Goals - 02/05/21 1606       PT LONG TERM GOAL #1   Title Patient will demonstrate at least 4/5 strength in bilateral hip abductors and extensors to improve stability about the chain with walking activity.    Baseline see flowsheet    Time 8    Period Weeks    Status New    Target Date 04/02/21      PT LONG TERM GOAL #2   Title Patinet will improve DGI to at least 19/24  for decreased fall risk    Baseline 16    Time 8    Period Weeks    Status New    Target Date 04/02/21      PT LONG TERM GOAL #3   Title Patient will complete reciprocal hurdle step overs x4 without LOB to improve safety with community ambulation    Baseline LOB with step overs as part of DGI assessment.    Time 8    Period Weeks    Status New    Target Date 04/02/21                 Plan - 03/12/21 1501    Clinical Impression Statement Able to progress to ankle strengthening with patient able to perform resisted ankle dorsiflexion,inversion, and eversion through partial range on the Lt ankle reporting the most difficulty with ankle dorsiflexion. Continued with gross LE strengthening and updated HEP to include further hip and ankle strengthening. Continued static balance training with patient having difficulty maintaining tandem stance with eyes closed bilaterally. Patient tolerated session well today without increased pain reported.    PT Frequency --   1-2/week   PT Duration 8 weeks    PT Treatment/Interventions ADLs/Self Care Home Management;Cryotherapy;Moist Heat;Gait training;Stair training;Therapeutic activities;Therapeutic exercise;Balance training;Neuromuscular re-education;Patient/family education;Manual techniques    PT Next Visit Plan gait training, LLE strengthening focusing on hip, hamstring, and ankle. static balance, step overs    PT Home Exercise Plan Access Code: [email or web address]    Consulted and Agree with Plan of Care Patient  Patient will benefit from skilled therapeutic intervention in order to improve the  following deficits and impairments:  Abnormal gait,Difficulty walking,Decreased activity tolerance,Pain,Decreased balance,Decreased strength  Visit Diagnosis: Muscle weakness (generalized)  Other abnormalities of gait and mobility  Unsteadiness on feet     Problem List Patient Active Problem List   Diagnosis Date Noted  . Smoker 05/06/2016  . Bipolar disorder ([name]) 05/06/2016  Gwendolyn Grant, PT, DPT, ATC 03/12/21 4:00 PM [facility] [address] [place], [place], [address] Phone: [phone]   Fax:  [phone]  Name: MACHEL VIOLANTE MRN: [id number] Date of Birth: Jul 20, 1971

## 2021-03-19 ENCOUNTER — Other Ambulatory Visit: Payer: Self-pay

## 2021-03-19 ENCOUNTER — Ambulatory Visit: Payer: Medicaid Other

## 2021-03-19 DIAGNOSIS — R2681 Unsteadiness on feet: Secondary | ICD-10-CM

## 2021-03-19 DIAGNOSIS — R2689 Other abnormalities of gait and mobility: Secondary | ICD-10-CM

## 2021-03-19 DIAGNOSIS — M6281 Muscle weakness (generalized): Secondary | ICD-10-CM | POA: Diagnosis not present

## 2021-03-19 NOTE — Therapy (Signed)
[place], [place], [address] Phone: [phone]   Fax:  [phone]  Physical Therapy Treatment  Patient Details  Name: [name] MRN: [id number] Date of Birth: 07-28-1971 Referring Provider (PT): Hayden Rasmussen,   Encounter Date: 03/19/2021   PT End of Session - 03/19/21 1238    Visit Number 6    Number of Visits 17    Date for PT Re-Evaluation 04/05/21    Authorization Type MCD    Authorization Time Period 4/20-6/14    Authorization - Visit Number 2    Authorization - Number of Visits 8    PT Start Time 1238   patient late   PT Stop Time 1316    PT Time Calculation (min) 38 min    Activity Tolerance Patient tolerated treatment well    Behavior During Therapy [facility] for tasks assessed/performed           Past Medical History:  Diagnosis Date  . Bipolar 1 disorder ([place])    followed by psychiatry  . Chronic kidney disease    hx of short term dialysis  . 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 ETOH abuse   . Foot drop, left   . Foot drop, left foot   . Polysubstance abuse ([place])    history of  . Smoker   . Swallowing difficulty    cannot swallow pills  . Teeth decayed   . Vitamin D deficiency     Past Surgical History:  Procedure Laterality Date  . arm surgery Left    compartment syndrome surgeries  . CESAREAN SECTION     fetal distress  . COLONOSCOPY  11/07/2020  . HAND SURGERY     for fracture  . TONSILLECTOMY    . TOOTH EXTRACTION Bilateral 10/26/2018   Procedure: MULTIPLE EXTRACTION # 18, 21, 22, 27, 28;  Surgeon: Diona Browner, DDS;  Location: [place];  Service: Oral Surgery;  Laterality: Bilateral;    There were no vitals filed for this visit.   Subjective Assessment - 03/19/21 1241    Subjective "I'm doing good, no pain right now. Haven't been doing my exercises this week."    Currently in Pain? No/denies    Pain Onset More than a month ago                              [facility] Adult PT Treatment/Exercise - 03/19/21 0001      Neuro Re-ed    Neuro Re-ed Details  SL cone taps tall cone 2 x 10 each; bosu mini squat 3 x 30 sec      Lumbar Exercises: Aerobic   Nustep level 5; 5 minutes UE/LE      Knee/Hip Exercises: Machines for Strengthening   Cybex Knee Flexion Lt HS curl 15 lbs 2 x 15    Total Gym Leg Press 1 x 10; 40 lbs      Knee/Hip Exercises: Standing   Other Standing Knee Exercises forward step overs small cone 2 x 10 each, lateral step overs small cone 20 reps, lateral step overs hurdle 20 reps                    PT Short Term Goals - 03/05/21 1508      PT SHORT TERM GOAL #1   Title Patient will be independent with initial HEP.    Status  Achieved      PT SHORT TERM GOAL #2   Title Patient will perform SLS on LLE for at least 3 seconds to improve overall balance and safety with ambulation.    Baseline 3 seconds with multiple attempts    Status Achieved      PT SHORT TERM GOAL #3   Title Patient will walk without hip hike.    Baseline no hip hike present, lateral trunk flexion and foot drop present on the Lt    Status Achieved             PT Long Term Goals - 02/05/21 1606      PT LONG TERM GOAL #1   Title Patient will demonstrate at least 4/5 strength in bilateral hip abductors and extensors to improve stability about the chain with walking activity.    Baseline see flowsheet    Time 8    Period Weeks    Status New    Target Date 04/02/21      PT LONG TERM GOAL #2   Title Patinet will improve DGI to at least 19/24  for decreased fall risk    Baseline 16    Time 8    Period Weeks    Status New    Target Date 04/02/21      PT LONG TERM GOAL #3   Title Patient will complete reciprocal hurdle step overs x4 without LOB to improve safety with community ambulation    Baseline LOB with step overs as part of DGI assessment.    Time 8    Period Weeks    Status New     Target Date 04/02/21                 Plan - 03/19/21 1241    Clinical Impression Statement Continued addressing gait mechanics with patient requiring cues for appropriate heel strike and to decrease circumduction swing during step overs on the Lt with ability to demonstrate appropriate hip/knee flexion, though only occasionally able to demo heel strike. Able to further progress SL dynamic balance training with patient requiring occasional UE support during SLS on the Lt. Patient tolerated session well today without reports of pain.    PT Frequency --   1-2/week   PT Duration 8 weeks    PT Treatment/Interventions ADLs/Self Care Home Management;Cryotherapy;Moist Heat;Gait training;Stair training;Therapeutic activities;Therapeutic exercise;Balance training;Neuromuscular re-education;Patient/family education;Manual techniques    PT Next Visit Plan gait training, LLE strengthening focusing on hip, hamstring, and ankle. static balance, step overs    PT Home Exercise Plan Access Code: [email or web address]    Consulted and Agree with Plan of Care Patient           Patient will benefit from skilled therapeutic intervention in order to improve the following deficits and impairments:  Abnormal gait,Difficulty walking,Decreased activity tolerance,Pain,Decreased balance,Decreased strength  Visit Diagnosis: Muscle weakness (generalized)  Other abnormalities of gait and mobility  Unsteadiness on feet     Problem List Patient Active Problem List   Diagnosis Date Noted  . Smoker 05/06/2016  . Bipolar disorder ([place]) 05/06/2016   Gwendolyn Grant, PT, DPT, ATC 03/19/21 2:17 PM [facility] [address] [place], [place], [address] Phone: [phone]   Fax:  [phone]  Name: [name] MRN: [id number] Date of Birth: 1970-11-25

## 2021-03-26 ENCOUNTER — Other Ambulatory Visit: Payer: Self-pay

## 2021-03-26 ENCOUNTER — Ambulatory Visit: Payer: Medicaid Other | Attending: Family Medicine

## 2021-03-26 DIAGNOSIS — M6281 Muscle weakness (generalized): Secondary | ICD-10-CM | POA: Diagnosis present

## 2021-03-26 DIAGNOSIS — R2681 Unsteadiness on feet: Secondary | ICD-10-CM | POA: Diagnosis present

## 2021-03-26 DIAGNOSIS — R2689 Other abnormalities of gait and mobility: Secondary | ICD-10-CM | POA: Diagnosis present

## 2021-03-26 NOTE — Therapy (Signed)
[place], [place], [address] Phone: [phone]   Fax:  [phone]  Physical Therapy Treatment  Patient Details  Name: [name] MRN: [id number] Date of Birth: 11-08-1971 Referring Provider (PT): Hayden Rasmussen,   Encounter Date: 03/26/2021   PT End of Session - 03/26/21 1449    Visit Number 7    Number of Visits 17    Date for PT Re-Evaluation 04/05/21    Authorization Type MCD    Authorization Time Period 4/20-6/14    Authorization - Visit Number 3    Authorization - Number of Visits 8    PT Start Time 9371    PT Stop Time 1530    PT Time Calculation (min) 41 min    Activity Tolerance Patient tolerated treatment well    Behavior During Therapy [facility] for tasks assessed/performed           Past Medical History:  Diagnosis Date  . Bipolar 1 disorder ([place])    followed by psychiatry  . Chronic kidney disease    hx of short term dialysis  . 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 ETOH abuse   . Foot drop, left   . Foot drop, left foot   . Polysubstance abuse ([place])    history of  . Smoker   . Swallowing difficulty    cannot swallow pills  . Teeth decayed   . Vitamin D deficiency     Past Surgical History:  Procedure Laterality Date  . arm surgery Left    compartment syndrome surgeries  . CESAREAN SECTION     fetal distress  . COLONOSCOPY  11/07/2020  . HAND SURGERY     for fracture  . TONSILLECTOMY    . TOOTH EXTRACTION Bilateral 10/26/2018   Procedure: MULTIPLE EXTRACTION # 18, 21, 22, 27, 28;  Surgeon: Diona Browner, DDS;  Location: [place];  Service: Oral Surgery;  Laterality: Bilateral;    There were no vitals filed for this visit.   Subjective Assessment - 03/26/21 1449    Subjective "I'm doing pretty good. My pain isn't terrible right now, but has been more than usual." She reports inconsistently completing HEP.    Currently in Pain? Yes     Pain Score 4     Pain Location Foot    Pain Orientation Left    Pain Descriptors / Indicators Dull    Pain Type Chronic pain    Pain Onset More than a month ago    Pain Frequency Intermittent              [facility] PT Assessment - 03/26/21 0001      Ambulation/Gait   Ambulation/Gait Yes    Ambulation Distance (Feet) 185 Feet    Assistive device --   AFO on Lt   Gait Comments stepping over obstacles (hurdles) with and without AFO, no LOB                         [facility] Adult PT Treatment/Exercise - 03/26/21 0001      Neuro Re-ed    Neuro Re-ed Details  SL cone taps tall cone 1 x 10 each, tandem walks 2  x20 ft, opposite touchdown 3 sets each      Lumbar Exercises: Aerobic   Nustep level 5; 5 minutes UE/LE      Knee/Hip Exercises: Standing   Other Standing Knee Exercises marching  2 x 20      Knee/Hip Exercises: Seated   Other Seated Knee/Hip Exercises stool scoots 120 ft      Ankle Exercises: Standing   Heel Walk (Round Trip) 20 ft    Toe Walk (Round Trip) 49f    Other Standing Ankle Exercises heel/toe rocks 20 reps                  PT Education - 03/26/21 1531    Education Details Educated on building tolerance to wear of AFO. Encouraged to complete prescribed HEP.    Person(s) Educated Patient    Methods Explanation    Comprehension Verbalized understanding            PT Short Term Goals - 03/05/21 1508      PT SHORT TERM GOAL #1   Title Patient will be independent with initial HEP.    Status Achieved      PT SHORT TERM GOAL #2   Title Patient will perform SLS on LLE for at least 3 seconds to improve overall balance and safety with ambulation.    Baseline 3 seconds with multiple attempts    Status Achieved      PT SHORT TERM GOAL #3   Title Patient will walk without hip hike.    Baseline no hip hike present, lateral trunk flexion and foot drop present on the Lt    Status Achieved             PT Long Term Goals - 03/26/21 1534       PT LONG TERM GOAL #1   Title Patient will demonstrate at least 4/5 strength in bilateral hip abductors and extensors to improve stability about the chain with walking activity.    Baseline see flowsheet    Time 8    Period Weeks    Status Unable to assess      PT LONG TERM GOAL #2   Title Patinet will improve DGI to at least 19/24  for decreased fall risk    Baseline 16    Time 8    Period Weeks    Status Unable to assess      PT LONG TERM GOAL #3   Title Patient will complete reciprocal hurdle step overs x4 without LOB to improve safety with community ambulation    Baseline able to complete without LOB 03/26/21    Time 8    Period Weeks    Status Achieved                 Plan - 03/26/21 1531    Clinical Impression Statement Patient arrived with AFO reporting not using it for the psat year due to increased foot discomfort when wearing. With AFO donned she demonstrates normalized gait pattern and reports feeling more stable or no discomfort. She was able to perform reciprocal step overs on hurdles without LOB while wearing AFO and also without the AFO, having met this long term functional goal. Continued with SL dynamic balance training with patient requiring occasional implementation of reaching strategy during SL activity on the LLE. Overall good tolerance to today's session without increased pain.    PT Frequency --   1-2/week   PT Duration 8 weeks    PT Treatment/Interventions ADLs/Self Care Home Management;Cryotherapy;Moist Heat;Gait training;Stair training;Therapeutic activities;Therapeutic exercise;Balance training;Neuromuscular re-education;Patient/family education;Manual techniques    PT Next Visit Plan re-eval gait training, LLE strengthening focusing on hip, hamstring, and ankle. static balance, step overs    PT  Home Exercise Plan Access Code: [email or web address]    Consulted and Agree with Plan of Care Patient           Patient will benefit from skilled therapeutic  intervention in order to improve the following deficits and impairments:  Abnormal gait,Difficulty walking,Decreased activity tolerance,Pain,Decreased balance,Decreased strength  Visit Diagnosis: Muscle weakness (generalized)  Other abnormalities of gait and mobility  Unsteadiness on feet     Problem List Patient Active Problem List   Diagnosis Date Noted  . Smoker 05/06/2016  . Bipolar disorder ([place]) 05/06/2016  Gwendolyn Grant, PT, DPT, ATC 03/26/21 3:37 PM  [facility] [address] [place], [place], [address] Phone: [phone]   Fax:  [phone]  Name: [name] MRN: [id number] Date of Birth: Jul 19, 1971

## 2021-04-02 ENCOUNTER — Other Ambulatory Visit: Payer: Self-pay

## 2021-04-02 ENCOUNTER — Ambulatory Visit: Payer: Medicaid Other

## 2021-04-02 DIAGNOSIS — M6281 Muscle weakness (generalized): Secondary | ICD-10-CM

## 2021-04-02 DIAGNOSIS — R2689 Other abnormalities of gait and mobility: Secondary | ICD-10-CM

## 2021-04-02 NOTE — Therapy (Signed)
[place], [place], [address] Phone: [phone]   Fax:  [phone]  Physical Therapy Treatment/Discharge  Patient Details  Name: [name] MRN: [id number] Date of Birth: Apr 22, 1971 Referring Provider (PT): Hayden Rasmussen,   Encounter Date: 04/02/2021   PT End of Session - 04/02/21 1453    Visit Number 8    Number of Visits 17    Date for PT Re-Evaluation 04/05/21    Authorization Type MCD    Authorization Time Period 4/20-6/14    Authorization - Visit Number 4    Authorization - Number of Visits 8    PT Start Time 6962   patient late   PT Stop Time 1529    PT Time Calculation (min) 34 min    Activity Tolerance Patient tolerated treatment well    Behavior During Therapy [facility] for tasks assessed/performed           Past Medical History:  Diagnosis Date  . Bipolar 1 disorder ([place])    followed by psychiatry  . Chronic kidney disease    hx of short term dialysis  . 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 ETOH abuse   . Foot drop, left   . Foot drop, left foot   . Polysubstance abuse ([place])    history of  . Smoker   . Swallowing difficulty    cannot swallow pills  . Teeth decayed   . Vitamin D deficiency     Past Surgical History:  Procedure Laterality Date  . arm surgery Left    compartment syndrome surgeries  . CESAREAN SECTION     fetal distress  . COLONOSCOPY  11/07/2020  . HAND SURGERY     for fracture  . TONSILLECTOMY    . TOOTH EXTRACTION Bilateral 10/26/2018   Procedure: MULTIPLE EXTRACTION # 18, 21, 22, 27, 28;  Surgeon: Diona Browner, DDS;  Location: [facility];  Service: Oral Surgery;  Laterality: Bilateral;    There were no vitals filed for this visit.   Subjective Assessment - 04/02/21 1456    Subjective "I'm doing ok. Pain isn't too terrible."    Currently in Pain? Yes    Pain Score 3     Pain Location Foot    Pain Orientation Left     Pain Descriptors / Indicators Dull    Pain Type Chronic pain    Pain Onset More than a month ago    Pain Frequency Constant    Aggravating Factors  walking, standing    Pain Relieving Factors rest              [facility] PT Assessment - 04/02/21 0001      Strength   Right Hip Flexion 5/5    Right Hip Extension 4/5    Right Hip ABduction 5/5    Left Hip Flexion 4+/5    Left Hip Extension 4-/5    Left Hip ABduction 4-/5    Right Knee Flexion 5/5    Right Knee Extension 5/5    Left Knee Flexion 4-/5    Left Knee Extension 4+/5    Left Ankle Dorsiflexion 3-/5    Left Ankle Plantar Flexion 4-/5   seated   Left Ankle Inversion 3-/5    Left Ankle Eversion 3+/5      Static Standing Balance   Static Standing - Comment/# of Minutes 5 sec on Lt; 30 sec Rt      Dynamic  Gait Index   Level Surface Mild Impairment    Change in Gait Speed Normal    Gait with Horizontal Head Turns Mild Impairment    Gait with Vertical Head Turns Mild Impairment    Gait and Pivot Turn Normal    Step Over Obstacle Normal    Step Around Obstacles Normal    Steps Normal    Total Score 21                         [facility] Adult PT Treatment/Exercise - 04/02/21 0001      Self-Care   Self-Care Other Self-Care Comments    Other Self-Care Comments  see patient education      Knee/Hip Exercises: Seated   Long Arc Quad 10 reps    Long Arc Quad Limitations red band bilateral    Hamstring Curl 10 reps    Hamstring Limitations red band; bilateral      Knee/Hip Exercises: Supine   Bridges with Clamshell 10 reps   blue     Knee/Hip Exercises: Sidelying   Hip ABduction 10 reps    Hip ABduction Limitations bilateral    Clams 1 x 10 blue band bilateral      Knee/Hip Exercises: Prone   Hip Extension 10 reps    Hip Extension Limitations bilateral      Ankle Exercises: Seated   Other Seated Ankle Exercises heel/toe rocks 1 x 10                  PT Education - 04/02/21 1519     Education Details D/C education. Education on re-assessment findings. Updated HEP.    Person(s) Educated Patient    Methods Explanation;Verbal cues;Handout    Comprehension Verbalized understanding;Returned demonstration            PT Short Term Goals - 03/05/21 1508      PT SHORT TERM GOAL #1   Title Patient will be independent with initial HEP.    Status Achieved      PT SHORT TERM GOAL #2   Title Patient will perform SLS on LLE for at least 3 seconds to improve overall balance and safety with ambulation.    Baseline 3 seconds with multiple attempts    Status Achieved      PT SHORT TERM GOAL #3   Title Patient will walk without hip hike.    Baseline no hip hike present, lateral trunk flexion and foot drop present on the Lt    Status Achieved             PT Long Term Goals - 04/02/21 1520      PT LONG TERM GOAL #1   Title Patient will demonstrate at least 4/5 strength in bilateral hip abductors and extensors to improve stability about the chain with walking activity.    Baseline see flowsheet    Time 8    Period Weeks    Status Partially Met      PT LONG TERM GOAL #2   Title Patinet will improve DGI to at least 19/24  for decreased fall risk    Baseline see flowsheet    Time 8    Period Weeks    Status Achieved      PT LONG TERM GOAL #3   Title Patient will complete reciprocal hurdle step overs x4 without LOB to improve safety with community ambulation    Baseline able to complete without LOB 03/26/21  Time 8    Period Weeks    Status Achieved                 Plan - 04/02/21 1511    Clinical Impression Statement Patient has made good functional progress demonstrating improvements in balance, strength, and gait stability since start of care. She has met the majority of her established functional goals and is pleased with her overall progress in PT. She has lingering weakness throughout the LLE, though demonstrates indepedence with advanced home program  to further progress her strength. She is therefore appropriate for discharge at this time to advanced home program.    PT Frequency --    PT Duration --    PT Treatment/Interventions ADLs/Self Care Home Management;Cryotherapy;Moist Heat;Gait training;Stair training;Therapeutic activities;Therapeutic exercise;Balance training;Neuromuscular re-education;Patient/family education;Manual techniques    PT Next Visit Plan --    PT Home Exercise Plan Access Code: [email or web address]    Consulted and Agree with Plan of Care Patient           Patient will benefit from skilled therapeutic intervention in order to improve the following deficits and impairments:  Abnormal gait,Difficulty walking,Decreased activity tolerance,Pain,Decreased balance,Decreased strength  Visit Diagnosis: Muscle weakness (generalized)  Other abnormalities of gait and mobility     Problem List Patient Active Problem List   Diagnosis Date Noted  . Smoker 05/06/2016  . Bipolar disorder ([place]) 05/06/2016  PHYSICAL THERAPY DISCHARGE SUMMARY  Visits from Start of Care: 8  Current functional level related to goals / functional outcomes: See above   Remaining deficits: See above   Education / Equipment: See above   Plan: Patient agrees to discharge.  Patient goals were partially met. Patient is being discharged due to being pleased with the current functional level.  ?????         Gwendolyn Grant, PT, DPT, ATC 04/02/21 3:41 PM  [facility] [address] [place], [place], [address] Phone: [phone]   Fax:  [phone]  Name: [name] MRN: [id number] Date of Birth: 05/10/1971

## 2021-06-04 ENCOUNTER — Encounter (HOSPITAL_COMMUNITY): Payer: Self-pay | Admitting: Emergency Medicine

## 2021-06-04 ENCOUNTER — Ambulatory Visit (INDEPENDENT_AMBULATORY_CARE_PROVIDER_SITE_OTHER): Payer: Medicaid Other

## 2021-06-04 ENCOUNTER — Other Ambulatory Visit: Payer: Self-pay

## 2021-06-04 ENCOUNTER — Ambulatory Visit (HOSPITAL_COMMUNITY)
Admission: EM | Admit: 2021-06-04 | Discharge: 2021-06-04 | Disposition: A | Discharge status: Home / Self Care | Payer: Medicaid Other | Attending: Medical Oncology | Admitting: Medical Oncology

## 2021-06-04 DIAGNOSIS — W19XXXA Unspecified fall, initial encounter: Secondary | ICD-10-CM

## 2021-06-04 DIAGNOSIS — M79642 Pain in left hand: Secondary | ICD-10-CM

## 2021-06-04 DIAGNOSIS — M79672 Pain in left foot: Secondary | ICD-10-CM

## 2021-06-04 MED ORDER — KETOROLAC TROMETHAMINE 60 MG/2ML IM SOLN
60.0000 mg | Freq: Once | INTRAMUSCULAR | Status: AC
  Administered 2021-06-04: 60 mg via INTRAMUSCULAR

## 2021-06-04 MED ORDER — KETOROLAC TROMETHAMINE 60 MG/2ML IM SOLN
INTRAMUSCULAR | Status: AC
  Filled 2021-06-04: qty 2

## 2021-06-04 NOTE — ED Notes (Signed)
Called 2 times in the lobby, pt not answered.

## 2021-06-04 NOTE — ED Notes (Signed)
Pt was called, states she is coming, she is in her car.

## 2021-06-04 NOTE — ED Provider Notes (Signed)
[place]    CSN: [id number] Arrival date & time: 06/04/21  1340      History   Chief Complaint Chief Complaint  Patient presents with   Fall    HPI [name] is a 50 y.o. female.   HPI  Fall: Patient reports that yesterday she was in her bedroom when she felt that her blood sugar may have gone a bit low and she felt a little dizzy.  She states that she fell forward onto her carpet.  She states that she does not know if she passed out or hit her head.  She reports that she does know that she woke up with left hand and foot pain.  This has not improved.  Currently, and today, she denies any headache, dizziness, double vision, ringing in ears, neck stiffness, neuro changes or weakness, nausea or vomiting. She has not taken anything for symptoms.   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   ETOH abuse    Foot drop, left    Foot drop, left foot    Polysubstance abuse ([place])    history of   Smoker    Swallowing difficulty    cannot swallow pills   Teeth decayed    Vitamin D deficiency     Patient Active Problem List   Diagnosis Date Noted   Smoker 05/06/2016   Bipolar disorder ([place]) 05/06/2016    Past Surgical History:  Procedure Laterality Date   arm surgery Left    compartment syndrome surgeries   CESAREAN SECTION     fetal distress   COLONOSCOPY  11/07/2020   HAND SURGERY     for fracture   TONSILLECTOMY     TOOTH EXTRACTION Bilateral 10/26/2018   Procedure: MULTIPLE EXTRACTION # 18, 21, 22, 27, 28;  Surgeon: Diona Browner, DDS;  Location: [facility];  Service: Oral Surgery;  Laterality: Bilateral;    OB History     Gravida  3   Para  2   Term      Preterm      AB  1   Living  2      SAB      IAB      Ectopic      Multiple      Live Births                Home Medications    Prior to Admission medications   Medication Sig Start Date End Date Taking? Authorizing Provider  FLUoxetine (PROZAC) 20 MG tablet Take 20 mg by mouth daily.   Yes [provider]  traZODone (DESYREL) 50 MG tablet Take 50 mg by mouth at bedtime.   Yes [provider]  VRAYLAR capsule Take 3 mg by mouth daily. 06/06/20  Yes [provider]  baclofen (LIORESAL) 10 MG tablet Take 10 mg by mouth 3 (three) times daily. Patient not taking: Reported on 01/17/2021    [provider]  dicyclomine (BENTYL) 20 MG tablet Take 1 tablet (20 mg total) by mouth in the morning and at bedtime. Patient not taking: Reported on 01/17/2021 11/18/20 01/17/21  Doran Stabler, MD    Family History Family History  Problem Relation Age of Onset   Cancer Mother  OVARIAN, NON-HODGKINS   Colon cancer Neg Hx    Pancreatic cancer Neg Hx    Esophageal cancer Neg Hx    Rectal cancer Neg Hx    Stomach cancer Neg Hx     Social History Social History   Tobacco Use   Smoking status: Every Day    Packs/day: 1.00    Pack years: 0.00    Types: Cigarettes   Smokeless tobacco: Never  Vaping Use   Vaping Use: Never used  Substance Use Topics   Alcohol use: Yes    Comment: beer 2-4 per day   Drug use: Not Currently    Types: Cocaine, Marijuana, "Crack" cocaine    Comment: LSD- last 1 week ago, 10/25/18 unsure the last time she used      Allergies   Patient has no known allergies.   Review of Systems Review of Systems  As stated above in HPI Physical Exam Triage Vital Signs ED Triage Vitals  Enc Vitals Group     BP 06/04/21 1613 116/73     Pulse Rate 06/04/21 1613 86     Resp 06/04/21 1613 20     Temp 06/04/21 1613 98.2 F (36.8 C)     Temp Source 06/04/21 1613 Oral     SpO2 06/04/21 1613 100 %     Weight --      Height --      Head Circumference --      Peak Flow --      Pain Score 06/04/21 1609 7     Pain Loc --      Pain  Edu? --      Excl. in [place]? --    No data found.  Updated Vital Signs BP 116/73 (BP Location: Right Arm)   Pulse 86   Temp 98.2 F (36.8 C) (Oral)   Resp 20   LMP  (LMP Unknown)   SpO2 100%   Physical Exam Vitals and nursing note reviewed.  Constitutional:      General: She is not in acute distress.    Appearance: Normal appearance. She is not ill-appearing, toxic-appearing or diaphoretic.  HENT:     Head: Normocephalic and atraumatic.  Eyes:     Extraocular Movements: Extraocular movements intact.     Pupils: Pupils are equal, round, and reactive to light.  Musculoskeletal:        General: Swelling (left lateral hand) and tenderness (left lateral hand and left mid foot) present.     Cervical back: Neck supple.     Comments: There is swelling of the lateral left hand with positive fracture testing. Grip strength decreased due to pain of the left hand. ROM fingers intact.   Skin:    General: Skin is warm.     Capillary Refill: Capillary refill takes less than 2 seconds.     Findings: Bruising (left lateral hand) present.  Neurological:     Mental Status: She is alert and oriented to person, place, and time.     Cranial Nerves: No cranial nerve deficit.     Sensory: No sensory deficit.     Motor: No weakness.     Coordination: Coordination normal.     Deep Tendon Reflexes: Reflexes normal.     UC Treatments / Results  Labs (all labs ordered are listed, but only abnormal results are displayed) Labs Reviewed - No data to display  EKG   Radiology No results found.  Procedures Procedures (including critical care time)  Medications Ordered  in UC Medications - No data to display  Initial Impression / Assessment and Plan / UC Course  I have reviewed the triage vital signs and the nursing notes.  Pertinent labs & imaging results that were available during my care of the patient were reviewed by me and considered in my medical decision making (see chart for  details).     New.  The events surrounding the incident are vague and patient is a slightly poor historian regarding these events which seems more intentional than due to memory difficulty.  Nonetheless we are going to obtain imaging to assess for any fractures of her hand or foot.  We discussed red flags and precautions regarding the potential for her hitting her head.  I am not sure that she hit her head or instead does not wish to discuss the events regarding her injuries. Toradol for pain today in office followed by OTC tylenol/ibuprofen as directed by bottle and tolerated by patient PRN given her history of substance abuse to reduce risk of relapse/death.  Final Clinical Impressions(s) / UC Diagnoses   Final diagnoses:  None   Discharge Instructions   None    ED Prescriptions   None    PDMP not reviewed this encounter.   Hughie Closs, Hershal Coria 06/04/21 2016

## 2021-06-04 NOTE — ED Triage Notes (Addendum)
Patient fell last night.  Bruise to left hand, swelling .  Pain in hand when moving left little finger.  Attempts to make a fist with left hand is painful  Pain in left foot.   No visible swelling or bruising  Patient speaks of nerve damage from illness 2 years ago

## 2021-06-25 ENCOUNTER — Ambulatory Visit: Payer: Medicaid Other | Attending: Family Medicine

## 2021-06-25 ENCOUNTER — Other Ambulatory Visit: Payer: Self-pay

## 2021-06-25 DIAGNOSIS — R2689 Other abnormalities of gait and mobility: Secondary | ICD-10-CM

## 2021-06-25 DIAGNOSIS — M25632 Stiffness of left wrist, not elsewhere classified: Secondary | ICD-10-CM | POA: Diagnosis present

## 2021-06-25 DIAGNOSIS — M79672 Pain in left foot: Secondary | ICD-10-CM | POA: Diagnosis present

## 2021-06-25 DIAGNOSIS — R208 Other disturbances of skin sensation: Secondary | ICD-10-CM | POA: Diagnosis present

## 2021-06-25 DIAGNOSIS — R278 Other lack of coordination: Secondary | ICD-10-CM | POA: Diagnosis present

## 2021-06-25 DIAGNOSIS — R29818 Other symptoms and signs involving the nervous system: Secondary | ICD-10-CM | POA: Insufficient documentation

## 2021-06-25 DIAGNOSIS — M6281 Muscle weakness (generalized): Secondary | ICD-10-CM

## 2021-06-25 DIAGNOSIS — M25642 Stiffness of left hand, not elsewhere classified: Secondary | ICD-10-CM | POA: Insufficient documentation

## 2021-06-25 DIAGNOSIS — R2681 Unsteadiness on feet: Secondary | ICD-10-CM | POA: Diagnosis present

## 2021-06-25 DIAGNOSIS — M79602 Pain in left arm: Secondary | ICD-10-CM | POA: Diagnosis present

## 2021-06-25 NOTE — Therapy (Signed)
[place], [place], [address] Phone: [phone]   Fax:  [phone]  Physical Therapy Evaluation  Patient Details  Name: [name] MRN: [id number] Date of Birth: Jul 17, 1971 Referring Provider (PT): Erle Crocker   Encounter Date: 06/25/2021   PT End of Session - 06/25/21 1155     Visit Number 1    Number of Visits 9    Date for PT Re-Evaluation 08/20/21    Authorization Type [place] [facility]    Authorization Time Period Pending authorization    PT Start Time 1050    PT Stop Time 1130    PT Time Calculation (min) 40 min    Activity Tolerance Patient tolerated treatment well    Behavior During Therapy WFL for tasks assessed/performed             Past Medical History:  Diagnosis Date   Bipolar 1 disorder ([place])    followed by psychiatry   Chronic kidney disease    hx of short term dialysis   Compartment syndrome ([place])    left arm   ETOH abuse    Foot drop, left    Foot drop, left foot    Polysubstance abuse ([place])    history of   Smoker    Swallowing difficulty    cannot swallow pills   Teeth decayed    Vitamin D deficiency     Past Surgical History:  Procedure Laterality Date   arm surgery Left    compartment syndrome surgeries   CESAREAN SECTION     fetal distress   COLONOSCOPY  11/07/2020   HAND SURGERY     for fracture   TONSILLECTOMY     TOOTH EXTRACTION Bilateral 10/26/2018   Procedure: MULTIPLE EXTRACTION # 18, 21, 22, 27, 28;  Surgeon: Diona Browner, DDS;  Location: [facility];  Service: Oral Surgery;  Laterality: Bilateral;    There were no vitals filed for this visit.    Subjective Assessment - 06/25/21 1056     Subjective Pt reports to clinic with primary c/o L foot pain and parasthesia following a fall 2 years ago during which she was unconscious for a couple of days, followed by a month-long hospitalization to treat L UE and LE rhabdomyolysis  and compartment syndrome. She also received PT for this problem which helped to improve symptoms. Her foot pain was exacerbated on 06/04/2021 when she fell again; she also experienced a L 5th metacarpal fx during this fall. She reports that her pain has overall improved since the initial injury, but currently, she experiences sharp pain in her L foot and N/T in her lower leg. She also reports a hx of lumbar spinal stenosis and sciatic nerve damage from her initial injury. She adds that she has L drop-foot; she has an AFO, but does not regularly use it.    Limitations Lifting;Standing;Walking;House hold activities    How long can you sit comfortably? 20 minutes until L LE N/T increases    How long can you stand comfortably? 8 minutes    How long can you walk comfortably? 10 minutes    Diagnostic tests 06/04/2021: DG L foot: IMPRESSION:  No acute bony findings.    Patient Stated Goals Learn how to utilize personal TENS unit, "get normal feeling back," walking without balance deficits    Currently in Pain? Yes    Pain Score 3     Pain Location Foot    Pain Orientation Left  Pain Descriptors / Indicators Dull    Pain Type Chronic pain    Pain Onset More than a month ago    Pain Frequency Intermittent    Aggravating Factors  Walking, standing, prolonged sitting    Pain Relieving Factors laying down    Multiple Pain Sites Yes    Pain Score 3    Pain Location Back    Pain Orientation Lower    Pain Descriptors / Indicators Aching    Pain Type Chronic pain    Pain Radiating Towards L LE    Pain Onset More than a month ago    Pain Frequency Intermittent                [facility] PT Assessment - 06/25/21 0001       Assessment   Medical Diagnosis L foot pain, drop foot    Referring Provider (PT) Melony Overly R    Onset Date/Surgical Date 06/26/19    Hand Dominance Right    Next MD Visit 06/30/2021    Prior Therapy Yes, in past for drop foot      Precautions   Precautions None       Restrictions   Weight Bearing Restrictions No      Balance Screen   Has the patient fallen in the past 6 months Yes    How many times? 5    Has the patient had a decrease in activity level because of a fear of falling?  Yes    Is the patient reluctant to leave their home because of a fear of falling?  No      Home Environment   Living Environment Private residence    Living Arrangements Children    Available Help at Discharge Family    Type of Power to enter    Entrance Stairs-Number of Steps 1    Home Layout Two level    Alternate Level Stairs-Number of Steps 13    Alternate Level Stairs-Rails Right;Left;Can reach both    [place] Crutches;Walker - 2 wheels;Wheelchair - manual      Prior Function   Level of Independence Independent    Leisure Spending time with son      Sensation   Light Touch Impaired Detail    Light Touch Impaired Details Impaired LLE   L2-S1 50% diminished on L vs R     Squat   Comments 60%      Single Leg Stance   Comments R: WNL x15 sec; L: Unable to perform      AROM   Right Ankle Dorsiflexion 15    Right Ankle Plantar Flexion 60    Right Ankle Inversion 35    Right Ankle Eversion 25    Left Ankle Dorsiflexion -20    Left Ankle Plantar Flexion 51    Left Ankle Inversion 12    Left Ankle Eversion 11      PROM   Left Ankle Dorsiflexion 0    Left Ankle Plantar Flexion 55    Left Ankle Inversion 20    Left Ankle Eversion 20      Strength   Right Hip Flexion 4/5    Right Hip Extension 4/5    Right Hip ABduction 4/5    Left Hip Flexion 3+/5    Left Hip Extension 3/5    Left Hip ABduction 3/5    Right Knee Flexion 4+/5    Right Knee Extension 4+/5  Left Knee Flexion 3+/5    Left Knee Extension 3+/5    Right Ankle Dorsiflexion 4+/5    Right Ankle Plantar Flexion 4/5    Right Ankle Inversion 4/5    Right Ankle Eversion 4+/5    Left Ankle Dorsiflexion 3-/5    Left Ankle Plantar Flexion 2/5    Left  Ankle Inversion 3-/5    Left Ankle Eversion 3-/5      Transfers   Five time sit to stand comments  18sec                        Objective measurements completed on examination: See above findings.               PT Education - 06/25/21 1155     Education Details Educated on potential underlying physiology behind her sxs, as well as proper form when performing HEP.    Person(s) Educated Patient    Methods Explanation;Handout;Demonstration    Comprehension Verbalized understanding;Returned demonstration              PT Short Term Goals - 06/25/21 1556       PT SHORT TERM GOAL #1   Title The pt will report understanding and adherence to her HEP in order to promote independence in the management of her primary impairments    Baseline HEP given at eval    Time 4    Period Weeks    Status New    Target Date 07/23/21      PT SHORT TERM GOAL #2   Title -    Baseline -      PT SHORT TERM GOAL #3   Title -    Baseline -      PT SHORT TERM GOAL #4   Title -    Baseline -               PT Long Term Goals - 06/25/21 1558       PT LONG TERM GOAL #1   Title Pt will demonstrate BIL global hip strength =4+/5 or greater in order to progress LE strengthening regimen without limitation.    Baseline 3/5 to 4/5 in all tested hip musculature    Time 8    Period Weeks    Status New    Target Date 08/20/21      PT LONG TERM GOAL #2   Title Pt will demonstrate L global ankle MMT of 4/5 or greater in order to promote WNL functional gait.    Baseline L ankle MMT 2/5 to 3-/5    Time 8    Period Weeks    Status New    Target Date 08/20/21      PT LONG TERM GOAL #3   Title Pt will improve 5xSTS to </= 13 seconds in order to demonstrate decreased fall risk.    Baseline 18sec    Time 8    Period Weeks    Status New    Target Date 08/20/21      PT LONG TERM GOAL #4   Title Pt will report ability to stand >25 minutes with <3/10 pain in order to  wash dishes without limitation.    Baseline unable to stand longer than 8 minutes without need for seated rest    Time 8    Period Weeks    Status New  Plan - 06/25/21 1316     Clinical Impression Statement The pt is a 50yo F who presents with primary c/o L foot pain, weakness, and parasthesia starting about 2 years ago following a fall which lead to compartment syndrome and rhabdomyolysis in her L LE. Upon assessment, her primary impairments include L global LE weakness with heightened impairments in the ankle, mild R global LE weakness, balance deficits, sensory deficits in the L LE, and diminished L ankle AROM. She will benefit from skilled PT to address these impairments and return to her functional baseline with less limitation.    Personal Factors and Comorbidities Time since onset of injury/illness/exacerbation;Past/Current Experience;Comorbidity 3+    Comorbidities bipolar, chronic kidney disease, compartment syndrome    Examination-Activity Limitations Locomotion Level;Squat;Stairs;Stand;Transfers;Bend;Lift;Carry    Examination-Participation Restrictions Cleaning;Shop;Laundry    Stability/Clinical Decision Making Evolving/Moderate complexity    Clinical Decision Making Moderate    Rehab Potential Fair    PT Frequency 1x / week    PT Duration 8 weeks    PT Treatment/Interventions ADLs/Self Care Home Management;Cryotherapy;Moist Heat;Gait training;Stair training;Therapeutic activities;Therapeutic exercise;Balance training;Neuromuscular re-education;Patient/family education;Manual techniques;Taping;Passive range of motion;Joint Manipulations;Splinting;Electrical Stimulation;Biofeedback;Aquatic Therapy    PT Next Visit Plan Assess DGI vs. BERG, progress LE strengthening, instruct pt on how to use her TENS unit    PT Home Exercise Plan [id number]    Consulted and Agree with Plan of Care Patient             Patient will benefit from skilled therapeutic  intervention in order to improve the following deficits and impairments:  Abnormal gait, Difficulty walking, Decreased activity tolerance, Pain, Decreased balance, Decreased strength, Impaired sensation, Decreased mobility, Decreased range of motion  Visit Diagnosis: Muscle weakness (generalized)  Other abnormalities of gait and mobility  Unsteadiness on feet  Other lack of coordination  Other disturbances of skin sensation  Pain in left foot     Problem List Patient Active Problem List   Diagnosis Date Noted   Smoker 05/06/2016   Bipolar disorder ([place]) 05/06/2016    Vanessa Calaveras, PT, DPT 06/25/21 4:05 PM   [facility] [address] [place], [place], [address] Phone: [phone]   Fax:  [phone]  Name: ROSALIND KOLLIN MRN: [id number] Date of Birth: Aug 19, 1971

## 2021-06-25 NOTE — Patient Instructions (Signed)
  [id number]

## 2021-07-02 ENCOUNTER — Other Ambulatory Visit: Payer: Self-pay

## 2021-07-02 ENCOUNTER — Ambulatory Visit: Payer: Medicaid Other

## 2021-07-02 DIAGNOSIS — R2681 Unsteadiness on feet: Secondary | ICD-10-CM

## 2021-07-02 DIAGNOSIS — R278 Other lack of coordination: Secondary | ICD-10-CM

## 2021-07-02 DIAGNOSIS — M79672 Pain in left foot: Secondary | ICD-10-CM

## 2021-07-02 DIAGNOSIS — R2689 Other abnormalities of gait and mobility: Secondary | ICD-10-CM

## 2021-07-02 DIAGNOSIS — M6281 Muscle weakness (generalized): Secondary | ICD-10-CM

## 2021-07-02 DIAGNOSIS — R208 Other disturbances of skin sensation: Secondary | ICD-10-CM

## 2021-07-02 NOTE — Patient Instructions (Signed)
  [id number]

## 2021-07-02 NOTE — Therapy (Signed)
[place], [place], [address] Phone: [phone]   Fax:  [phone]  Physical Therapy Treatment  Patient Details  Name: [name] MRN: [id number] Date of Birth: [birth year] Referring Provider (PT): Erle Crocker   Encounter Date: 07/02/2021   PT End of Session - 07/02/21 1215     Visit Number 2    Number of Visits 9    Date for PT Re-Evaluation 08/20/21    Authorization Type [place] [facility]    Authorization Time Period Authorized for 3 visits from 07/02/21-07/22/21    Authorization - Visit Number 1    Authorization - Number of Visits 3    PT Start Time 1130    PT Stop Time 1215    PT Time Calculation (min) 45 min    Equipment Utilized During Treatment Gait belt    Activity Tolerance Patient tolerated treatment well    Behavior During Therapy WFL for tasks assessed/performed             Past Medical History:  Diagnosis Date   Bipolar 1 disorder ([place])    followed by psychiatry   Chronic kidney disease    hx of short term dialysis   Compartment syndrome ([place])    left arm   ETOH abuse    Foot drop, left    Foot drop, left foot    Polysubstance abuse ([place])    history of   Smoker    Swallowing difficulty    cannot swallow pills   Teeth decayed    Vitamin D deficiency     Past Surgical History:  Procedure Laterality Date   arm surgery Left    compartment syndrome surgeries   CESAREAN SECTION     fetal distress   COLONOSCOPY  11/07/2020   HAND SURGERY     for fracture   TONSILLECTOMY     TOOTH EXTRACTION Bilateral 10/26/2018   Procedure: MULTIPLE EXTRACTION # 18, 21, 22, 27, 28;  Surgeon: Diona Browner, DDS;  Location: [facility];  Service: Oral Surgery;  Laterality: Bilateral;    There were no vitals filed for this visit.   Subjective Assessment - 07/02/21 1134     Subjective Pt reports feeling well today, adding that she has been doing her HEP daily. She  also reports that she is set to receive a bone density test later today.    Currently in Pain? No/denies    Pain Score 0-No pain                               [facility] Adult PT Treatment/Exercise - 07/02/21 0001       Modalities   Modalities Electrical Stimulation      Electrical Stimulation   Electrical Stimulation Location L anterior tibialis    Electrical Stimulation Action EMS    Electrical Stimulation Parameters pt's portable TENS/ EMS unit, program 1, '10Hz'$  each current    Paramedic   Education Provided Other (comment)   Usage of personal TENS/ EMS device     Ankle Exercises: Standing   SLS 3x30sec on Airex pad with UE support    Heel Raises Other (comment)   2x20 on Airex pad with UE support   Toe Raise Other (comment)   2x20 on Airex pad with UE support   Other Standing Ankle Exercises Forward/ backward marching on [place]  side of Bosu ball 2x10 each direction                    PT Education - 07/02/21 1215     Education Details pt educated on the proper usage of her personal TENS/ EMS device, as well as proper form with new exercises.    Person(s) Educated Patient    Methods Explanation;Demonstration;Handout    Comprehension Returned demonstration;Verbalized understanding              PT Short Term Goals - 06/25/21 1556       PT SHORT TERM GOAL #1   Title The pt will report understanding and adherence to her HEP in order to promote independence in the management of her primary impairments    Baseline HEP given at eval    Time 4    Period Weeks    Status New    Target Date 07/23/21      PT SHORT TERM GOAL #2   Title -    Baseline -      PT SHORT TERM GOAL #3   Title -    Baseline -      PT SHORT TERM GOAL #4   Title -    Baseline -               PT Long Term Goals - 06/25/21 1558       PT LONG TERM GOAL #1   Title Pt will demonstrate BIL global hip  strength =4+/5 or greater in order to progress LE strengthening regimen without limitation.    Baseline 3/5 to 4/5 in all tested hip musculature    Time 8    Period Weeks    Status New    Target Date 08/20/21      PT LONG TERM GOAL #2   Title Pt will demonstrate L global ankle MMT of 4/5 or greater in order to promote WNL functional gait.    Baseline L ankle MMT 2/5 to 3-/5    Time 8    Period Weeks    Status New    Target Date 08/20/21      PT LONG TERM GOAL #3   Title Pt will improve 5xSTS to </= 13 seconds in order to demonstrate decreased fall risk.    Baseline 18sec    Time 8    Period Weeks    Status New    Target Date 08/20/21      PT LONG TERM GOAL #4   Title Pt will report ability to stand >25 minutes with <3/10 pain in order to wash dishes without limitation.    Baseline unable to stand longer than 8 minutes without need for seated rest    Time 8    Period Weeks    Status New                   Plan - 07/02/21 1242     Clinical Impression Statement Pt responded well to all treatment today, demonstrating good form and no increase in pain with all selected exercises. She brings her personal TENS unit and asks the PT to help her to use it. The PT offered instruction on how to use the device, and the device was utilized during the treatment session to facilitate L anterior tibialis activation. She will continue to benefit from skilled PT to address her primary impairments and return to her prior level of function.    Personal Factors and Comorbidities  Time since onset of injury/illness/exacerbation;Past/Current Experience;Comorbidity 3+    Comorbidities bipolar, chronic kidney disease, compartment syndrome    Examination-Activity Limitations Locomotion Level;Squat;Stairs;Stand;Transfers;Bend;Lift;Carry    Examination-Participation Restrictions Cleaning;Shop;Laundry    Stability/Clinical Decision Making Evolving/Moderate complexity    Clinical Decision Making  Moderate    Rehab Potential Fair    PT Frequency 1x / week    PT Duration 8 weeks    PT Treatment/Interventions ADLs/Self Care Home Management;Cryotherapy;Moist Heat;Gait training;Stair training;Therapeutic activities;Therapeutic exercise;Balance training;Neuromuscular re-education;Patient/family education;Manual techniques;Taping;Passive range of motion;Joint Manipulations;Splinting;Electrical Stimulation;Biofeedback;Aquatic Therapy    PT Next Visit Plan Assess DGI vs. BERG, progress LE strengthening with focus on L anterior tibialis    PT Home Exercise Plan [id number]    Consulted and Agree with Plan of Care Patient             Patient will benefit from skilled therapeutic intervention in order to improve the following deficits and impairments:  Abnormal gait, Difficulty walking, Decreased activity tolerance, Pain, Decreased balance, Decreased strength, Impaired sensation, Decreased mobility, Decreased range of motion  Visit Diagnosis: Muscle weakness (generalized)  Other abnormalities of gait and mobility  Unsteadiness on feet  Other lack of coordination  Other disturbances of skin sensation  Pain in left foot     Problem List Patient Active Problem List   Diagnosis Date Noted   Smoker 05/06/2016   Bipolar disorder ([place]) 05/06/2016    Vanessa [place], PT, DPT 07/02/21 12:50 PM   [facility] [address] [place], [place], [address] Phone: [phone]   Fax:  [phone]  Name: GENNELL FRANCA MRN: [id number] Date of Birth: [birth year]

## 2021-07-09 ENCOUNTER — Other Ambulatory Visit: Payer: Self-pay

## 2021-07-09 ENCOUNTER — Ambulatory Visit: Payer: Medicaid Other

## 2021-07-09 DIAGNOSIS — M79602 Pain in left arm: Secondary | ICD-10-CM

## 2021-07-09 DIAGNOSIS — M6281 Muscle weakness (generalized): Secondary | ICD-10-CM

## 2021-07-09 DIAGNOSIS — M25642 Stiffness of left hand, not elsewhere classified: Secondary | ICD-10-CM

## 2021-07-09 DIAGNOSIS — R208 Other disturbances of skin sensation: Secondary | ICD-10-CM

## 2021-07-09 DIAGNOSIS — R2681 Unsteadiness on feet: Secondary | ICD-10-CM

## 2021-07-09 DIAGNOSIS — M79672 Pain in left foot: Secondary | ICD-10-CM

## 2021-07-09 DIAGNOSIS — M25632 Stiffness of left wrist, not elsewhere classified: Secondary | ICD-10-CM

## 2021-07-09 DIAGNOSIS — R29818 Other symptoms and signs involving the nervous system: Secondary | ICD-10-CM

## 2021-07-09 DIAGNOSIS — R278 Other lack of coordination: Secondary | ICD-10-CM

## 2021-07-09 DIAGNOSIS — R2689 Other abnormalities of gait and mobility: Secondary | ICD-10-CM

## 2021-07-09 NOTE — Therapy (Signed)
[place], [place], [address] Phone: [phone]   Fax:  [phone]  Physical Therapy Treatment  Patient Details  Name: [name] MRN: [id number] Date of Birth: 1971-04-18 Referring Provider (PT): Erle Crocker   Encounter Date: 07/09/2021   PT End of Session - 07/09/21 1150     Visit Number 3    Number of Visits 9    Date for PT Re-Evaluation 08/20/21    Authorization Type [place] [facility]    Authorization Time Period Authorized for 3 visits from 07/02/21-07/22/21    Authorization - Visit Number 2    Authorization - Number of Visits 3    PT Start Time 1130    PT Stop Time 1215    PT Time Calculation (min) 45 min    Equipment Utilized During Treatment Gait belt    Activity Tolerance Patient tolerated treatment well    Behavior During Therapy WFL for tasks assessed/performed             Past Medical History:  Diagnosis Date   Bipolar 1 disorder ([place])    followed by psychiatry   Chronic kidney disease    hx of short term dialysis   Compartment syndrome ([place])    left arm   ETOH abuse    Foot drop, left    Foot drop, left foot    Polysubstance abuse ([place])    history of   Smoker    Swallowing difficulty    cannot swallow pills   Teeth decayed    Vitamin D deficiency     Past Surgical History:  Procedure Laterality Date   arm surgery Left    compartment syndrome surgeries   CESAREAN SECTION     fetal distress   COLONOSCOPY  11/07/2020   HAND SURGERY     for fracture   TONSILLECTOMY     TOOTH EXTRACTION Bilateral 10/26/2018   Procedure: MULTIPLE EXTRACTION # 18, 21, 22, 27, 28;  Surgeon: Diona Browner, DDS;  Location: [facility];  Service: Oral Surgery;  Laterality: Bilateral;    There were no vitals filed for this visit.   Subjective Assessment - 07/09/21 1126     Subjective Pt reports feeling well today, adding that she has been adherent to her HEP,  performing some of her exercises with her TENS unit.    Currently in Pain? No/denies    Pain Score 0-No pain                               [facility] Adult PT Treatment/Exercise - 07/09/21 0001       Ambulation/Gait   Ambulation/Gait Yes    Ambulation/Gait Assistance 5: Supervision    Ambulation Distance (Feet) 90 Feet    Gait Comments Stepping over hurdles in // bars with personal EMS unit on L anterior tibialis x3 laps      Neuro Re-ed    Neuro Re-ed Details  Pt's personal EMS device utilized with exercises requiring PF and DF      Knee/Hip Exercises: Standing   Forward Lunges Other (comment)   Alternating with lead foot on Bosu ball 2x10 BIL   Lateral Step Up Step Height: 4";Other (comment)   To L with personal EMS device on L anterior tibialis 3x15   SLS On Airex pad in // bars 3x30sec BIL      Ankle Exercises: Standing   Heel Raises Other (  comment)   Heel raises with personal EMS unit on L calf musculature, default settings, 3x15   Toe Raise --   Heel raises/ toe raises with personal EMS unit on L anterior tibialis, default settings, 3x15                   PT Education - 07/09/21 1149     Education Details pt offered additional education on the proper usage of her personal TENS/ EMS device, as well as proper form with new exercises.    Person(s) Educated Patient    Methods Explanation;Demonstration;Handout    Comprehension Verbalized understanding;Returned demonstration              PT Short Term Goals - 06/25/21 1556       PT SHORT TERM GOAL #1   Title The pt will report understanding and adherence to her HEP in order to promote independence in the management of her primary impairments    Baseline HEP given at eval    Time 4    Period Weeks    Status New    Target Date 07/23/21      PT SHORT TERM GOAL #2   Title -    Baseline -      PT SHORT TERM GOAL #3   Title -    Baseline -      PT SHORT TERM GOAL #4   Title -     Baseline -               PT Long Term Goals - 06/25/21 1558       PT LONG TERM GOAL #1   Title Pt will demonstrate BIL global hip strength =4+/5 or greater in order to progress LE strengthening regimen without limitation.    Baseline 3/5 to 4/5 in all tested hip musculature    Time 8    Period Weeks    Status New    Target Date 08/20/21      PT LONG TERM GOAL #2   Title Pt will demonstrate L global ankle MMT of 4/5 or greater in order to promote WNL functional gait.    Baseline L ankle MMT 2/5 to 3-/5    Time 8    Period Weeks    Status New    Target Date 08/20/21      PT LONG TERM GOAL #3   Title Pt will improve 5xSTS to </= 13 seconds in order to demonstrate decreased fall risk.    Baseline 18sec    Time 8    Period Weeks    Status New    Target Date 08/20/21      PT LONG TERM GOAL #4   Title Pt will report ability to stand >25 minutes with <3/10 pain in order to wash dishes without limitation.    Baseline unable to stand longer than 8 minutes without need for seated rest    Time 8    Period Weeks    Status New                   Plan - 07/09/21 1210     Clinical Impression Statement Pt responded well to all treatment today, demonstrating good form and no increase in pain with all selected exercises. She brings her personal TENS unit, which was utilized throughout the session to assist anterior and posterior tibialis activation with exercises requireing ankle PF and DF. She will continue to benefit from skilled PT to address her  primary impairments and return to her prior level of function.    Personal Factors and Comorbidities Time since onset of injury/illness/exacerbation;Past/Current Experience;Comorbidity 3+    Comorbidities bipolar, chronic kidney disease, compartment syndrome    Examination-Activity Limitations Locomotion Level;Squat;Stairs;Stand;Transfers;Bend;Lift;Carry    Examination-Participation Restrictions Cleaning;Shop;Laundry     Stability/Clinical Decision Making Evolving/Moderate complexity    Clinical Decision Making Moderate    Rehab Potential Fair    PT Frequency 1x / week    PT Duration 8 weeks    PT Treatment/Interventions ADLs/Self Care Home Management;Cryotherapy;Moist Heat;Gait training;Stair training;Therapeutic activities;Therapeutic exercise;Balance training;Neuromuscular re-education;Patient/family education;Manual techniques;Taping;Passive range of motion;Joint Manipulations;Splinting;Electrical Stimulation;Biofeedback;Aquatic Therapy    PT Next Visit Plan Assess DGI vs. BERG, progress LE strengthening with focus on L anterior tibialis    PT Home Exercise Plan [id number]    Consulted and Agree with Plan of Care Patient             Patient will benefit from skilled therapeutic intervention in order to improve the following deficits and impairments:  Abnormal gait, Difficulty walking, Decreased activity tolerance, Pain, Decreased balance, Decreased strength, Impaired sensation, Decreased mobility, Decreased range of motion  Visit Diagnosis: Muscle weakness (generalized)  Other abnormalities of gait and mobility  Unsteadiness on feet  Other lack of coordination  Other disturbances of skin sensation  Pain in left foot  Other symptoms and signs involving the nervous system  Stiffness of left hand, not elsewhere classified  Stiffness of left wrist, not elsewhere classified  Pain in left arm     Problem List Patient Active Problem List   Diagnosis Date Noted   Smoker 05/06/2016   Bipolar disorder ([place]) 05/06/2016    Vanessa [place], PT, DPT 07/09/21 12:14 PM   [facility] [address] [place], [place], [address] Phone: [phone]   Fax:  [phone]  Name: [name] MRN: [id number] Date of Birth: [birth year]

## 2021-07-09 NOTE — Patient Instructions (Signed)
  [id number]

## 2021-07-13 ENCOUNTER — Other Ambulatory Visit: Payer: Self-pay

## 2021-07-13 ENCOUNTER — Ambulatory Visit (HOSPITAL_COMMUNITY)
Admission: EM | Admit: 2021-07-13 | Discharge: 2021-07-13 | Disposition: A | Discharge status: Home / Self Care | Payer: Medicaid Other

## 2021-07-13 ENCOUNTER — Encounter (HOSPITAL_COMMUNITY): Payer: Self-pay

## 2021-07-13 DIAGNOSIS — M25561 Pain in right knee: Secondary | ICD-10-CM

## 2021-07-13 NOTE — Discharge Instructions (Addendum)
I have low suspicion of injury to the bone, ligament or tendon, please follow up if pain persistent for 2 weeks  You may take ibuprofen 400-600 mg or tylenol 650 mg every 6 hours for pain   You may use ice or heat in 15 minute intervals over area for comfort  As pain lessens continue to try to apply weight to elg and walk around  Watch area for signs of infection such as increased pain ,increased swelling, drainage, fever or chills

## 2021-07-13 NOTE — ED Triage Notes (Signed)
Pt states she had a fall yesterday and has pain in her right knee. Patient is limping.

## 2021-07-14 NOTE — ED Provider Notes (Signed)
[facility]    CSN: [id number] Arrival date & time: 07/13/21  1650      History   Chief Complaint Chief Complaint  Patient presents with   Leg Pain    HPI [name] is a 50 y.o. female.   Patient presents with abrasion and tenderness to right knee after falling yesterday and landing directly onto the knee.  Range of motion intact.  Painful to bear weight, limp while walking.  Denies numbness, tingling.  Has not attempted treatment.  History of frequent falls, being treated for muscle weakness and is in physical therapy.  Past Medical History:  Diagnosis Date   Bipolar 1 disorder ([place])    followed by psychiatry   Chronic kidney disease    hx of short term dialysis   Compartment syndrome ([place])    left arm   ETOH abuse    Foot drop, left    Foot drop, left foot    Polysubstance abuse ([place])    history of   Smoker    Swallowing difficulty    cannot swallow pills   Teeth decayed    Vitamin D deficiency     Patient Active Problem List   Diagnosis Date Noted   Smoker 05/06/2016   Bipolar disorder ([place]) 05/06/2016    Past Surgical History:  Procedure Laterality Date   arm surgery Left    compartment syndrome surgeries   CESAREAN SECTION     fetal distress   COLONOSCOPY  11/07/2020   HAND SURGERY     for fracture   TONSILLECTOMY     TOOTH EXTRACTION Bilateral 10/26/2018   Procedure: MULTIPLE EXTRACTION # 18, 21, 22, 27, 28;  Surgeon: Diona Browner, DDS;  Location: [facility];  Service: Oral Surgery;  Laterality: Bilateral;    OB History     Gravida  3   Para  2   Term      Preterm      AB  1   Living  2      SAB      IAB      Ectopic      Multiple      Live Births               Home Medications    Prior to Admission medications   Medication Sig Start Date End Date Taking? Authorizing Provider  FLUoxetine (PROZAC) 20 MG tablet Take 20 mg by mouth daily.    [provider]  traZODone  (DESYREL) 50 MG tablet Take 50 mg by mouth at bedtime.    [provider]  VRAYLAR capsule Take 3 mg by mouth daily. 06/06/20   [provider]    Family History Family History  Problem Relation Age of Onset   Cancer Mother        OVARIAN, NON-HODGKINS   Colon cancer Neg Hx    Pancreatic cancer Neg Hx    Esophageal cancer Neg Hx    Rectal cancer Neg Hx    Stomach cancer Neg Hx     Social History Social History   Tobacco Use   Smoking status: Every Day    Packs/day: 1.00    Types: Cigarettes   Smokeless tobacco: Never  Vaping Use   Vaping Use: Never used  Substance Use Topics   Alcohol use: Yes    Comment: beer 2-4 per day   Drug use: Not Currently    Types: Cocaine, Marijuana, "Crack" cocaine  Comment: LSD- last 1 week ago, 10/25/18 unsure the last time she used      Allergies   Patient has no known allergies.   Review of Systems Review of Systems  Constitutional: Negative.   Respiratory: Negative.    Cardiovascular: Negative.   Musculoskeletal: Negative.   Skin: Negative.   Neurological: Negative.     Physical Exam Triage Vital Signs ED Triage Vitals  Enc Vitals Group     BP 07/13/21 1720 115/78     Pulse Rate 07/13/21 1720 88     Resp 07/13/21 1720 18     Temp 07/13/21 1720 98.2 F (36.8 C)     Temp Source 07/13/21 1720 Oral     SpO2 07/13/21 1720 98 %     Weight --      Height --      Head Circumference --      Peak Flow --      Pain Score 07/13/21 1721 6     Pain Loc --      Pain Edu? --      Excl. in [place]? --    No data found.  Updated Vital Signs BP 115/78 (BP Location: Left Arm)   Pulse 88   Temp 98.2 F (36.8 C) (Oral)   Resp 18   LMP  (LMP Unknown)   SpO2 98%   Visual Acuity Right Eye Distance:   Left Eye Distance:   Bilateral Distance:    Right Eye Near:   Left Eye Near:    Bilateral Near:     Physical Exam Constitutional:      Appearance: Normal appearance.  Eyes:     Extraocular Movements:  Extraocular movements intact.  Pulmonary:     Effort: Pulmonary effort is normal.  Neurological:     Mental Status: She is alert and oriented to person, place, and time. Mental status is at baseline.  Psychiatric:        Mood and Affect: Mood normal.        Behavior: Behavior normal.     UC Treatments / Results  Labs (all labs ordered are listed, but only abnormal results are displayed) Labs Reviewed - No data to display  EKG   Radiology No results found.  Procedures Procedures (including critical care time)  Medications Ordered in UC Medications - No data to display  Initial Impression / Assessment and Plan / UC Course  I have reviewed the triage vital signs and the nursing notes.  Pertinent labs & imaging results that were available during my care of the patient were reviewed by me and considered in my medical decision making (see chart for details).  Acute pain of right knee  History of injury to the bone, tendons, will defer x-ray at this time, etiology of symptoms most likely related to soft tissue swelling caused by fall, discussed with patient, in agreement with plan of care, will use over-the-counter medications for pain management, ice and heat for comfort has upcoming appointment next week with her orthopedic specialist and plans to have them reevaluate at that time Cute pain of right knee,,Final Clinical Impressions(s) / UC Diagnoses   Final diagnoses:  Acute pain of right knee     Discharge Instructions      I have low suspicion of injury to the bone, ligament or tendon, please follow up if pain persistent for 2 weeks  You may take ibuprofen 400-600 mg or tylenol 650 mg every 6 hours for pain   You may  use ice or heat in 15 minute intervals over area for comfort  As pain lessens continue to try to apply weight to elg and walk around  Watch area for signs of infection such as increased pain ,increased swelling, drainage, fever or chills        ED  Prescriptions   None    PDMP not reviewed this encounter.   Hans [place], NP 07/14/21 1143

## 2021-07-17 ENCOUNTER — Other Ambulatory Visit: Payer: Self-pay

## 2021-07-17 ENCOUNTER — Ambulatory Visit: Payer: Medicaid Other

## 2021-07-17 DIAGNOSIS — R278 Other lack of coordination: Secondary | ICD-10-CM

## 2021-07-17 DIAGNOSIS — R2689 Other abnormalities of gait and mobility: Secondary | ICD-10-CM

## 2021-07-17 DIAGNOSIS — M6281 Muscle weakness (generalized): Secondary | ICD-10-CM

## 2021-07-17 DIAGNOSIS — R208 Other disturbances of skin sensation: Secondary | ICD-10-CM

## 2021-07-17 DIAGNOSIS — R2681 Unsteadiness on feet: Secondary | ICD-10-CM

## 2021-07-17 DIAGNOSIS — M79672 Pain in left foot: Secondary | ICD-10-CM

## 2021-07-17 NOTE — Therapy (Signed)
[place], [place], [address] Phone: [phone]   Fax:  [phone]  Physical Therapy Treatment  Progress Note Reporting Period 06/25/2021 to 07/17/2021  See note below for Objective Data and Assessment of Progress/Goals.      Patient Details  Name: [name] MRN: [id number] Date of Birth: 01/15/1971 Referring Provider (PT): Erle Crocker   Encounter Date: 07/17/2021   PT End of Session - 07/17/21 1738     Visit Number 4    Number of Visits 9    Date for PT Re-Evaluation 08/20/21    Authorization Type [place] MCD    Authorization Time Period Authorized for 3 visits from 07/02/21-07/22/21    Authorization - Visit Number 3    Authorization - Number of Visits 3    PT Start Time 1700    PT Stop Time 1745    PT Time Calculation (min) 45 min    Equipment Utilized During Treatment Gait belt    Activity Tolerance Patient tolerated treatment well    Behavior During Therapy WFL for tasks assessed/performed             Past Medical History:  Diagnosis Date   Bipolar 1 disorder ([place])    followed by psychiatry   Chronic kidney disease    hx of short term dialysis   Compartment syndrome ([place])    left arm   ETOH abuse    Foot drop, left    Foot drop, left foot    Polysubstance abuse ([place])    history of   Smoker    Swallowing difficulty    cannot swallow pills   Teeth decayed    Vitamin D deficiency     Past Surgical History:  Procedure Laterality Date   arm surgery Left    compartment syndrome surgeries   CESAREAN SECTION     fetal distress   COLONOSCOPY  11/07/2020   HAND SURGERY     for fracture   TONSILLECTOMY     TOOTH EXTRACTION Bilateral 10/26/2018   Procedure: MULTIPLE EXTRACTION # 18, 21, 22, 27, 28;  Surgeon: Diona Browner, DDS;  Location: [facility];  Service: Oral Surgery;  Laterality: Bilateral;    There were no vitals filed for this visit.    Subjective Assessment - 07/17/21 1654     Subjective Pt reports falling on Monday and being evaluated in the ED, who cleared her for fx or other concerning injuries. She states she just had some bruising and abrasion of her L knee. She reports not being as diligent this week with her HEP due to outstanding circumstances.    How long can you sit comfortably? 20 minutes until L LE N/T increases    How long can you stand comfortably? 8 minutes    How long can you walk comfortably? 10 minutes    Currently in Pain? Yes    Pain Score 4     Pain Location Ankle    Pain Orientation Left    Pain Descriptors / Indicators Aching    Pain Type Chronic pain                [facility] PT Assessment - 07/17/21 0001       AROM   Left Ankle Dorsiflexion -3    Left Ankle Plantar Flexion 51    Left Ankle Inversion 12    Left Ankle Eversion 11      Strength   Right Hip Flexion  4/5    Right Hip Extension 4/5    Right Hip ABduction 4+/5    Left Hip Flexion 3+/5    Left Hip Extension 3/5    Left Hip ABduction 3/5    Right Knee Flexion 4+/5    Right Knee Extension 4+/5    Left Knee Flexion 3+/5    Left Knee Extension 3+/5    Right Ankle Dorsiflexion 5/5    Right Ankle Plantar Flexion 4+/5    Right Ankle Inversion 4+/5    Right Ankle Eversion 5/5    Left Ankle Dorsiflexion 3/5    Left Ankle Plantar Flexion 2+/5    Left Ankle Inversion 3-/5    Left Ankle Eversion 3-/5      Transfers   Five time sit to stand comments  13sec                           [facility] Adult PT Treatment/Exercise - 07/17/21 0001       Ambulation/Gait   Ambulation/Gait Yes    Ambulation/Gait Assistance 5: Supervision    Ambulation Distance (Feet) 180 Feet    Gait Comments Stepping over hurdles in // bars with reciprocal gait pattern x3 laps      Ankle Exercises: Standing   SLS 3x30sec on L on each side of Bosu ball (inversion and eversion stance)    Other Standing Ankle Exercises Step up on Bosu ball  with contralateral knee raise 3x10 BIL in // bars    Other Standing Ankle Exercises Standing ankle pumps 3x20 in between step-up sets                    PT Education - 07/17/21 1737     Education Details Pt educated on ways to progress her independent HEP.    Person(s) Educated Patient    Methods Explanation    Comprehension Verbalized understanding              PT Short Term Goals - 07/17/21 1711       PT SHORT TERM GOAL #1   Title The pt will report understanding and adherence to her HEP in order to promote independence in the management of her primary impairments    Baseline Pt reports adherence to HEP    Time 4    Period Weeks    Status Achieved    Target Date 07/23/21      PT SHORT TERM GOAL #2   Title -    Baseline -      PT SHORT TERM GOAL #3   Title -    Baseline -      PT SHORT TERM GOAL #4   Title -    Baseline -               PT Long Term Goals - 07/17/21 1712       PT LONG TERM GOAL #1   Title Pt will demonstrate BIL global hip strength =4+/5 or greater in order to progress LE strengthening regimen without limitation.    Baseline 3/5 to 4/5 in all tested hip musculature    Time 8    Period Weeks    Status On-going      PT LONG TERM GOAL #2   Title Pt will demonstrate L global ankle MMT of 4/5 or greater in order to promote WNL functional gait.    Baseline See flowsheet    Time 8  Period Weeks    Status On-going      PT LONG TERM GOAL #3   Title Pt will improve 5xSTS to </= 13 seconds in order to demonstrate decreased fall risk.    Baseline 13sec (07/17/2021)    Time 8    Period Weeks    Status Achieved      PT LONG TERM GOAL #4   Title Pt will report ability to stand >25 minutes with <3/10 pain in order to wash dishes without limitation.    Baseline unable to stand longer than 8 minutes without need for seated rest    Time 8    Period Weeks    Status On-going    Target Date 08/20/21      PT LONG TERM GOAL #5    Title Pt will achieve 5 degrees of L ankle dorsiflexion AROM in order to clear the foot during gait.    Baseline -20d at eval, -3d on 07/17/2021    Time 4    Period Weeks    Status New    Target Date 08/20/21                   Plan - 07/17/21 1738     Clinical Impression Statement Pt responded well to all treatment today, demonstrating good form and no increase in pain with all selected exercises. Upon re-assessment of objective data, pt has made improvements in 5xSTS, meeting her LTG of 13 seconds already, as well as making meager improvements in LE strength. She will continue to benefit from skilled PT to address her primary impairments and return to her prior level of function.    Personal Factors and Comorbidities Time since onset of injury/illness/exacerbation;Past/Current Experience;Comorbidity 3+    Comorbidities bipolar, chronic kidney disease, compartment syndrome    Examination-Activity Limitations Locomotion Level;Squat;Stairs;Stand;Transfers;Bend;Lift;Carry    Examination-Participation Restrictions Cleaning;Shop;Laundry    Stability/Clinical Decision Making Evolving/Moderate complexity    Clinical Decision Making Moderate    Rehab Potential Fair    PT Frequency 1x / week    PT Duration 8 weeks    PT Treatment/Interventions ADLs/Self Care Home Management;Cryotherapy;Moist Heat;Gait training;Stair training;Therapeutic activities;Therapeutic exercise;Balance training;Neuromuscular re-education;Patient/family education;Manual techniques;Taping;Passive range of motion;Joint Manipulations;Splinting;Electrical Stimulation;Biofeedback;Aquatic Therapy    PT Next Visit Plan Assess DGI vs. BERG, progress LE strengthening with focus on L anterior tibialis    PT Home Exercise Plan [id number]    Consulted and Agree with Plan of Care Patient             Patient will benefit from skilled therapeutic intervention in order to improve the following deficits and impairments:  Abnormal  gait, Difficulty walking, Decreased activity tolerance, Pain, Decreased balance, Decreased strength, Impaired sensation, Decreased mobility, Decreased range of motion  Visit Diagnosis: Muscle weakness (generalized)  Other abnormalities of gait and mobility  Unsteadiness on feet  Other lack of coordination  Other disturbances of skin sensation  Pain in left foot     Problem List Patient Active Problem List   Diagnosis Date Noted   Smoker 05/06/2016   Bipolar disorder ([place]) 05/06/2016    Vanessa Hopkins, PT, DPT 07/17/21 5:43 PM   [facility] [address] [place], [place], [address] Phone: [phone]   Fax:  [phone]  Name: [name] MRN: [id number] Date of Birth: [birth year]

## 2021-07-17 NOTE — Patient Instructions (Signed)
Pt instructed to continue her previously prescribed HEP.

## 2021-07-24 ENCOUNTER — Other Ambulatory Visit: Payer: Self-pay

## 2021-07-24 ENCOUNTER — Ambulatory Visit: Payer: Medicaid Other | Attending: Family Medicine

## 2021-07-24 DIAGNOSIS — R278 Other lack of coordination: Secondary | ICD-10-CM | POA: Diagnosis present

## 2021-07-24 DIAGNOSIS — M79672 Pain in left foot: Secondary | ICD-10-CM | POA: Diagnosis present

## 2021-07-24 DIAGNOSIS — R208 Other disturbances of skin sensation: Secondary | ICD-10-CM | POA: Diagnosis present

## 2021-07-24 DIAGNOSIS — R2681 Unsteadiness on feet: Secondary | ICD-10-CM | POA: Diagnosis present

## 2021-07-24 DIAGNOSIS — R2689 Other abnormalities of gait and mobility: Secondary | ICD-10-CM | POA: Diagnosis present

## 2021-07-24 DIAGNOSIS — M25642 Stiffness of left hand, not elsewhere classified: Secondary | ICD-10-CM | POA: Diagnosis present

## 2021-07-24 DIAGNOSIS — M6281 Muscle weakness (generalized): Secondary | ICD-10-CM | POA: Insufficient documentation

## 2021-07-24 DIAGNOSIS — M79602 Pain in left arm: Secondary | ICD-10-CM | POA: Insufficient documentation

## 2021-07-24 DIAGNOSIS — M25632 Stiffness of left wrist, not elsewhere classified: Secondary | ICD-10-CM | POA: Insufficient documentation

## 2021-07-24 NOTE — Patient Instructions (Signed)
  [id number]

## 2021-07-24 NOTE — Therapy (Signed)
[place], [place], [address] Phone: [phone]   Fax:  [phone]  Physical Therapy Treatment  Patient Details  Name: [name] MRN: [id number] Date of Birth: [birth year] Referring Provider (PT): Erle Crocker   Encounter Date: 07/24/2021   PT End of Session - 07/24/21 1406     Visit Number 5    Number of Visits 9    Date for PT Re-Evaluation 08/20/21    Authorization Type [place] MCD    Authorization Time Period Authorized for 8 visits from 07/24/2021 to 09/17/2021    Authorization - Visit Number 4    Authorization - Number of Visits 11    PT Start Time 1400    PT Stop Time 1445    PT Time Calculation (min) 45 min    Equipment Utilized During Treatment Gait belt    Activity Tolerance Patient tolerated treatment well    Behavior During Therapy WFL for tasks assessed/performed             Past Medical History:  Diagnosis Date   Bipolar 1 disorder ([place])    followed by psychiatry   Chronic kidney disease    hx of short term dialysis   Compartment syndrome ([place])    left arm   ETOH abuse    Foot drop, left    Foot drop, left foot    Polysubstance abuse ([place])    history of   Smoker    Swallowing difficulty    cannot swallow pills   Teeth decayed    Vitamin D deficiency     Past Surgical History:  Procedure Laterality Date   arm surgery Left    compartment syndrome surgeries   CESAREAN SECTION     fetal distress   COLONOSCOPY  11/07/2020   HAND SURGERY     for fracture   TONSILLECTOMY     TOOTH EXTRACTION Bilateral 10/26/2018   Procedure: MULTIPLE EXTRACTION # 18, 21, 22, 27, 28;  Surgeon: Diona Browner, DDS;  Location: [facility];  Service: Oral Surgery;  Laterality: Bilateral;    There were no vitals filed for this visit.   Subjective Assessment - 07/24/21 1402     Subjective Pt reports not being adherent to her HEP this week due to having company.  She reports she has started experienced L plantar foot pain and tingling shooting through her big toe. She also reports not using her TENS unit this week.    Currently in Pain? Yes    Pain Score 4     Pain Location Foot    Pain Orientation Left    Pain Descriptors / Indicators Burning;Aching    Pain Type Chronic pain                               [facility] Adult PT Treatment/Exercise - 07/24/21 0001       Knee/Hip Exercises: Machines for Strengthening   Cybex Leg Press Heel raise bounce 3x30 at 40#    Hip Cybex Hib abduction and extension at 25# 2x10 BIL each      Knee/Hip Exercises: Standing   Lateral Step Up Step Height: 4";Other (comment)   2x15 BIL     Ankle Exercises: Standing   Other Standing Ankle Exercises Forward lunge with foot rolling onto Bosu ball 2x10 BIL    Other Standing Ankle Exercises Knee drives into 3-sec SLS 3x10 BIL  PT Education - 07/24/21 1429     Education Details Pt educated on proper form with new exercises.    Person(s) Educated Patient    Methods Explanation;Demonstration;Handout    Comprehension Verbalized understanding;Returned demonstration              PT Short Term Goals - 07/17/21 1711       PT SHORT TERM GOAL #1   Title The pt will report understanding and adherence to her HEP in order to promote independence in the management of her primary impairments    Baseline Pt reports adherence to HEP    Time 4    Period Weeks    Status Achieved    Target Date 07/23/21      PT SHORT TERM GOAL #2   Title -    Baseline -      PT SHORT TERM GOAL #3   Title -    Baseline -      PT SHORT TERM GOAL #4   Title -    Baseline -               PT Long Term Goals - 07/17/21 1712       PT LONG TERM GOAL #1   Title Pt will demonstrate BIL global hip strength =4+/5 or greater in order to progress LE strengthening regimen without limitation.    Baseline 3/5 to 4/5 in all tested hip  musculature    Time 8    Period Weeks    Status On-going      PT LONG TERM GOAL #2   Title Pt will demonstrate L global ankle MMT of 4/5 or greater in order to promote WNL functional gait.    Baseline See flowsheet    Time 8    Period Weeks    Status On-going      PT LONG TERM GOAL #3   Title Pt will improve 5xSTS to </= 13 seconds in order to demonstrate decreased fall risk.    Baseline 13sec (07/17/2021)    Time 8    Period Weeks    Status Achieved      PT LONG TERM GOAL #4   Title Pt will report ability to stand >25 minutes with <3/10 pain in order to wash dishes without limitation.    Baseline unable to stand longer than 8 minutes without need for seated rest    Time 8    Period Weeks    Status On-going    Target Date 08/20/21      PT LONG TERM GOAL #5   Title Pt will achieve 5 degrees of L ankle dorsiflexion AROM in order to clear the foot during gait.    Baseline -20d at eval, -3d on 07/17/2021    Time 4    Period Weeks    Status New    Target Date 08/20/21                   Plan - 07/24/21 1426     Clinical Impression Statement Pt responded well to all treatment today, demonstrating good form and no increase in pain with all selected exercises. She demonstrates improved functional hip and ankle strength with selected interventions today. She will continue to benefit from skilled PT to address her primary impairments and return to her prior level of function.    Personal Factors and Comorbidities Time since onset of injury/illness/exacerbation;Past/Current Experience;Comorbidity 3+    Comorbidities bipolar, chronic kidney disease, compartment syndrome  Examination-Activity Limitations Locomotion Level;Squat;Stairs;Stand;Transfers;Bend;Lift;Carry    Examination-Participation Restrictions Cleaning;Shop;Laundry    Stability/Clinical Decision Making Evolving/Moderate complexity    Clinical Decision Making Moderate    Rehab Potential Fair    PT Frequency 1x /  week    PT Duration 8 weeks    PT Treatment/Interventions ADLs/Self Care Home Management;Cryotherapy;Moist Heat;Gait training;Stair training;Therapeutic activities;Therapeutic exercise;Balance training;Neuromuscular re-education;Patient/family education;Manual techniques;Taping;Passive range of motion;Joint Manipulations;Splinting;Electrical Stimulation;Biofeedback;Aquatic Therapy    PT Next Visit Plan Assess DGI vs. BERG, progress LE strengthening with focus on L anterior tibialis    PT Home Exercise Plan [id number]    Consulted and Agree with Plan of Care Patient             Patient will benefit from skilled therapeutic intervention in order to improve the following deficits and impairments:  Abnormal gait, Difficulty walking, Decreased activity tolerance, Pain, Decreased balance, Decreased strength, Impaired sensation, Decreased mobility, Decreased range of motion  Visit Diagnosis: Muscle weakness (generalized)  Other abnormalities of gait and mobility  Unsteadiness on feet  Other lack of coordination  Other disturbances of skin sensation  Pain in left foot     Problem List Patient Active Problem List   Diagnosis Date Noted   Smoker 05/06/2016   Bipolar disorder ([place]) 05/06/2016    Vanessa [place], PT, DPT 07/24/21 2:39 PM   [facility] [address] [place], [place], [address] Phone: [phone]   Fax:  [phone]  Name: YAMILE NOIA MRN: [id number] Date of Birth: 1971/09/16

## 2021-08-05 ENCOUNTER — Ambulatory Visit: Payer: Medicaid Other

## 2021-08-05 ENCOUNTER — Other Ambulatory Visit: Payer: Self-pay

## 2021-08-05 DIAGNOSIS — R208 Other disturbances of skin sensation: Secondary | ICD-10-CM

## 2021-08-05 DIAGNOSIS — M79672 Pain in left foot: Secondary | ICD-10-CM

## 2021-08-05 DIAGNOSIS — R2681 Unsteadiness on feet: Secondary | ICD-10-CM

## 2021-08-05 DIAGNOSIS — M6281 Muscle weakness (generalized): Secondary | ICD-10-CM | POA: Diagnosis not present

## 2021-08-05 DIAGNOSIS — R2689 Other abnormalities of gait and mobility: Secondary | ICD-10-CM

## 2021-08-05 DIAGNOSIS — R278 Other lack of coordination: Secondary | ICD-10-CM

## 2021-08-05 NOTE — Therapy (Signed)
[place], [place], [address] Phone: [phone]   Fax:  [phone]  Physical Therapy Treatment  Patient Details  Name: [name] MRN: [id number] Date of Birth: 06-11-1971 Referring Provider (PT): Erle Crocker   Encounter Date: 08/05/2021   PT End of Session - 08/05/21 [id number]     Visit Number 6    Number of Visits 9    Date for PT Re-Evaluation 08/20/21    Authorization Type [place] MCD    Authorization Time Period Authorized for 8 visits from 07/24/2021 to 09/17/2021    Authorization - Visit Number 5    Authorization - Number of Visits 11    PT Start Time [id number]    PT Stop Time 1700    PT Time Calculation (min) 45 min    Activity Tolerance Patient tolerated treatment well    Behavior During Therapy WFL for tasks assessed/performed             Past Medical History:  Diagnosis Date   Bipolar 1 disorder ([place])    followed by psychiatry   Chronic kidney disease    hx of short term dialysis   Compartment syndrome ([place])    left arm   ETOH abuse    Foot drop, left    Foot drop, left foot    Polysubstance abuse ([place])    history of   Smoker    Swallowing difficulty    cannot swallow pills   Teeth decayed    Vitamin D deficiency     Past Surgical History:  Procedure Laterality Date   arm surgery Left    compartment syndrome surgeries   CESAREAN SECTION     fetal distress   COLONOSCOPY  11/07/2020   HAND SURGERY     for fracture   TONSILLECTOMY     TOOTH EXTRACTION Bilateral 10/26/2018   Procedure: MULTIPLE EXTRACTION # 18, 21, 22, 27, 28;  Surgeon: Diona Browner, DDS;  Location: [facility];  Service: Oral Surgery;  Laterality: Bilateral;    There were no vitals filed for this visit.   Subjective Assessment - 08/05/21 1615     Subjective Pt reports not being adherent to her HEP over the past week due to being busy with current court hearings. She reports  trying to wear the AFO over the week, but says she cannot tolerate it for longer than 15-20 minutes.    Currently in Pain? No/denies    Pain Score 0-No pain                [facility] PT Assessment - 08/05/21 0001       Standardized Balance Assessment   Standardized Balance Assessment Berg Balance Test      Berg Balance Test   Sit to Stand Able to stand without using hands and stabilize independently    Standing Unsupported Able to stand safely 2 minutes    Sitting with Back Unsupported but Feet Supported on Floor or Stool Able to sit safely and securely 2 minutes    Stand to Sit Sits safely with minimal use of hands    Transfers Able to transfer safely, minor use of hands    Standing Unsupported with Eyes Closed Able to stand 10 seconds safely    Standing Unsupported with Feet Together Able to place feet together independently and stand 1 minute safely    From Standing, Reach Forward with Outstretched Arm Can reach confidently >25 cm (10")  From Standing Position, Pick up Object from [place] to pick up shoe safely and easily    From Standing Position, Turn to Look Behind Over each Shoulder Looks behind from both sides and weight shifts well    Turn 360 Degrees Able to turn 360 degrees safely in 4 seconds or less    Standing Unsupported, Alternately Place Feet on Step/Stool Able to stand independently and safely and complete 8 steps in 20 seconds    Standing Unsupported, One Foot in [place] to place foot tandem independently and hold 30 seconds    Standing on One Leg Able to lift leg independently and hold > 10 seconds    Total Score 56                           [facility] Adult PT Treatment/Exercise - 08/05/21 0001       Ambulation/Gait   Ambulation/Gait Yes    Ambulation/Gait Assistance 6: Modified independent (Device/Increase time)    Ambulation Distance (Feet) 180 Feet    Assistive device Parallel bars    Gait Comments Instructed to over-exagerate rolling  mechanic during stance      Knee/Hip Exercises: Machines for Strengthening   Cybex Leg Press Heel raise bounce 3x30 at 60#    Total Gym Leg Press SL leg press 60# 2x10 BIL    Hip Cybex Hib abduction and extension at 37.5## 2x10 BIL each      Ankle Exercises: Standing   Heel Raises Other (comment)   SL heel/ toe rocks on Airex pad 2x20   Other Standing Ankle Exercises SL supination/ pronation on Airex pad in // bars 2x10 BIL    Other Standing Ankle Exercises Knee drives into 3-sec SLS on Airex pad 3x10 BIL                       PT Short Term Goals - 07/17/21 1711       PT SHORT TERM GOAL #1   Title The pt will report understanding and adherence to her HEP in order to promote independence in the management of her primary impairments    Baseline Pt reports adherence to HEP    Time 4    Period Weeks    Status Achieved    Target Date 07/23/21      PT SHORT TERM GOAL #2   Title -    Baseline -      PT SHORT TERM GOAL #3   Title -    Baseline -      PT SHORT TERM GOAL #4   Title -    Baseline -               PT Long Term Goals - 07/17/21 1712       PT LONG TERM GOAL #1   Title Pt will demonstrate BIL global hip strength =4+/5 or greater in order to progress LE strengthening regimen without limitation.    Baseline 3/5 to 4/5 in all tested hip musculature    Time 8    Period Weeks    Status On-going      PT LONG TERM GOAL #2   Title Pt will demonstrate L global ankle MMT of 4/5 or greater in order to promote WNL functional gait.    Baseline See flowsheet    Time 8    Period Weeks    Status On-going      PT LONG  TERM GOAL #3   Title Pt will improve 5xSTS to </= 13 seconds in order to demonstrate decreased fall risk.    Baseline 13sec (07/17/2021)    Time 8    Period Weeks    Status Achieved      PT LONG TERM GOAL #4   Title Pt will report ability to stand >25 minutes with <3/10 pain in order to wash dishes without limitation.    Baseline unable  to stand longer than 8 minutes without need for seated rest    Time 8    Period Weeks    Status On-going    Target Date 08/20/21      PT LONG TERM GOAL #5   Title Pt will achieve 5 degrees of L ankle dorsiflexion AROM in order to clear the foot during gait.    Baseline -20d at eval, -3d on 07/17/2021    Time 4    Period Weeks    Status New    Target Date 08/20/21                   Plan - 08/05/21 1645     Clinical Impression Statement Pt responded well to all treatment today, demonstrating good form and no increase in pain with all selected exercises. She demonstrates continued improved functional hip and ankle strength with selected interventions today, as well as good functional balance, as indicated by her perfect BERG score. She will continue to benefit from skilled PT to address her primary impairments and return to her prior level of function.    Personal Factors and Comorbidities Time since onset of injury/illness/exacerbation;Past/Current Experience;Comorbidity 3+    Comorbidities bipolar, chronic kidney disease, compartment syndrome    Examination-Activity Limitations Locomotion Level;Squat;Stairs;Stand;Transfers;Bend;Lift;Carry    Examination-Participation Restrictions Cleaning;Shop;Laundry    Stability/Clinical Decision Making Evolving/Moderate complexity    Clinical Decision Making Moderate    Rehab Potential Fair    PT Frequency 1x / week    PT Duration 8 weeks    PT Treatment/Interventions ADLs/Self Care Home Management;Cryotherapy;Moist Heat;Gait training;Stair training;Therapeutic activities;Therapeutic exercise;Balance training;Neuromuscular re-education;Patient/family education;Manual techniques;Taping;Passive range of motion;Joint Manipulations;Splinting;Electrical Stimulation;Biofeedback;Aquatic Therapy    PT Next Visit Plan progress LE strengthening with focus on L anterior tibialis    PT Home Exercise Plan [id number]    Consulted and Agree with Plan of Care  Patient             Patient will benefit from skilled therapeutic intervention in order to improve the following deficits and impairments:  Abnormal gait, Difficulty walking, Decreased activity tolerance, Pain, Decreased balance, Decreased strength, Impaired sensation, Decreased mobility, Decreased range of motion  Visit Diagnosis: Muscle weakness (generalized)  Other abnormalities of gait and mobility  Unsteadiness on feet  Other lack of coordination  Other disturbances of skin sensation  Pain in left foot     Problem List Patient Active Problem List   Diagnosis Date Noted   Smoker 05/06/2016   Bipolar disorder ([place]) 05/06/2016    Vanessa [place], PT, DPT 08/05/21 4:56 PM   [facility] [facility] [address] [place], [place], [address] Phone: [phone]   Fax:  [phone]  Name: TAKELA GANTER MRN: [id number] Date of Birth: May 05, 1971

## 2021-08-14 ENCOUNTER — Other Ambulatory Visit: Payer: Self-pay

## 2021-08-14 ENCOUNTER — Ambulatory Visit: Payer: Medicaid Other

## 2021-08-14 DIAGNOSIS — M79672 Pain in left foot: Secondary | ICD-10-CM

## 2021-08-14 DIAGNOSIS — R2681 Unsteadiness on feet: Secondary | ICD-10-CM

## 2021-08-14 DIAGNOSIS — R208 Other disturbances of skin sensation: Secondary | ICD-10-CM

## 2021-08-14 DIAGNOSIS — R278 Other lack of coordination: Secondary | ICD-10-CM

## 2021-08-14 DIAGNOSIS — R2689 Other abnormalities of gait and mobility: Secondary | ICD-10-CM

## 2021-08-14 DIAGNOSIS — M6281 Muscle weakness (generalized): Secondary | ICD-10-CM | POA: Diagnosis not present

## 2021-08-14 NOTE — Patient Instructions (Signed)
  [id number]

## 2021-08-14 NOTE — Therapy (Signed)
[place], [place], [address] Phone: [phone]   Fax:  [phone]  Physical Therapy Treatment  Patient Details  Name: [name] MRN: [id number] Date of Birth: 1971/03/08 Referring Provider (PT): Erle Crocker   Encounter Date: 08/14/2021   PT End of Session - 08/14/21 1630     Visit Number 7    Number of Visits 9    Date for PT Re-Evaluation 08/20/21    Authorization Type [place] MCD    Authorization Time Period Authorized for 8 visits from 07/24/2021 to 09/17/2021    Authorization - Visit Number 3    Authorization - Number of Visits 8    PT Start Time 0355    PT Stop Time 1700    PT Time Calculation (min) 45 min    Equipment Utilized During Treatment Gait belt    Activity Tolerance Patient tolerated treatment well    Behavior During Therapy WFL for tasks assessed/performed             Past Medical History:  Diagnosis Date   Bipolar 1 disorder ([place])    followed by psychiatry   Chronic kidney disease    hx of short term dialysis   Compartment syndrome ([place])    left arm   ETOH abuse    Foot drop, left    Foot drop, left foot    Polysubstance abuse ([place])    history of   Smoker    Swallowing difficulty    cannot swallow pills   Teeth decayed    Vitamin D deficiency     Past Surgical History:  Procedure Laterality Date   arm surgery Left    compartment syndrome surgeries   CESAREAN SECTION     fetal distress   COLONOSCOPY  11/07/2020   HAND SURGERY     for fracture   TONSILLECTOMY     TOOTH EXTRACTION Bilateral 10/26/2018   Procedure: MULTIPLE EXTRACTION # 18, 21, 22, 27, 28;  Surgeon: Diona Browner, DDS;  Location: [facility];  Service: Oral Surgery;  Laterality: Bilateral;    There were no vitals filed for this visit.   Subjective Assessment - 08/14/21 1620     Subjective Pt reports varied HEP adherence. She reports experiencing improved balance  and L ankle ROM since starting PT.    Limitations Lifting;Standing;Walking;House hold activities    How long can you sit comfortably? 20 minutes until L LE N/T increases    How long can you stand comfortably? 25 minutes    How long can you walk comfortably? 30 minutes    Diagnostic tests 06/04/2021: DG L foot: IMPRESSION:  No acute bony findings.    Patient Stated Goals Learn how to utilize personal TENS unit, "get normal feeling back," walking without balance deficits    Currently in Pain? No/denies    Pain Score 0-No pain                               [facility] Adult PT Treatment/Exercise - 08/14/21 0001       Knee/Hip Exercises: Machines for Strengthening   Hip Cybex Hib abduction and extension at 42.5## 2x10 BIL each      Ankle Exercises: Stretches   Gastroc Stretch 2 reps;60 seconds   Standing at wall     Ankle Exercises: Machines for Strengthening   Cybex Leg Press Bouncing heel raises 3x30 at 60#  Ankle Exercises: Standing   Other Standing Ankle Exercises Squat hops 3x10    Other Standing Ankle Exercises SL heel rocks on edge of Bosu ball 2x10 BIL each side of Bosu ball                     PT Education - 08/14/21 1629     Education Details Updated HEP    Person(s) Educated Patient    Methods Explanation;Demonstration;Handout    Comprehension Verbalized understanding;Returned demonstration              PT Short Term Goals - 07/17/21 1711       PT SHORT TERM GOAL #1   Title The pt will report understanding and adherence to her HEP in order to promote independence in the management of her primary impairments    Baseline Pt reports adherence to HEP    Time 4    Period Weeks    Status Achieved    Target Date 07/23/21      PT SHORT TERM GOAL #2   Title -    Baseline -      PT SHORT TERM GOAL #3   Title -    Baseline -      PT SHORT TERM GOAL #4   Title -    Baseline -               PT Long Term Goals - 07/17/21  1712       PT LONG TERM GOAL #1   Title Pt will demonstrate BIL global hip strength =4+/5 or greater in order to progress LE strengthening regimen without limitation.    Baseline 3/5 to 4/5 in all tested hip musculature    Time 8    Period Weeks    Status On-going      PT LONG TERM GOAL #2   Title Pt will demonstrate L global ankle MMT of 4/5 or greater in order to promote WNL functional gait.    Baseline See flowsheet    Time 8    Period Weeks    Status On-going      PT LONG TERM GOAL #3   Title Pt will improve 5xSTS to </= 13 seconds in order to demonstrate decreased fall risk.    Baseline 13sec (07/17/2021)    Time 8    Period Weeks    Status Achieved      PT LONG TERM GOAL #4   Title Pt will report ability to stand >25 minutes with <3/10 pain in order to wash dishes without limitation.    Baseline unable to stand longer than 8 minutes without need for seated rest    Time 8    Period Weeks    Status On-going    Target Date 08/20/21      PT LONG TERM GOAL #5   Title Pt will achieve 5 degrees of L ankle dorsiflexion AROM in order to clear the foot during gait.    Baseline -20d at eval, -3d on 07/17/2021    Time 4    Period Weeks    Status New    Target Date 08/20/21                   Plan - 08/14/21 1650     Clinical Impression Statement Pt responded well to all treatment today, demonstrating good form and no increase in pain with all selected exercises. She continues to demonstrate good improvement of functional ability as indicated  by her being able to perform squat jumps with good plantarflexion force production. She will continue to benefit from skilled PT to address her primary impairments and return to her prior level of function.    Personal Factors and Comorbidities Time since onset of injury/illness/exacerbation;Past/Current Experience;Comorbidity 3+    Comorbidities bipolar, chronic kidney disease, compartment syndrome    Examination-Activity  Limitations Locomotion Level;Squat;Stairs;Stand;Transfers;Bend;Lift;Carry    Examination-Participation Restrictions Cleaning;Shop;Laundry    Stability/Clinical Decision Making Evolving/Moderate complexity    Clinical Decision Making Moderate    Rehab Potential Fair    PT Frequency 1x / week    PT Duration 8 weeks    PT Treatment/Interventions ADLs/Self Care Home Management;Cryotherapy;Moist Heat;Gait training;Stair training;Therapeutic activities;Therapeutic exercise;Balance training;Neuromuscular re-education;Patient/family education;Manual techniques;Taping;Passive range of motion;Joint Manipulations;Splinting;Electrical Stimulation;Biofeedback;Aquatic Therapy    PT Next Visit Plan progress LE strengthening with focus on L anterior tibialis, reassess goals/ consider D/C if continued progress made    PT Home Exercise Plan [id number]    Consulted and Agree with Plan of Care Patient             Patient will benefit from skilled therapeutic intervention in order to improve the following deficits and impairments:  Abnormal gait, Difficulty walking, Decreased activity tolerance, Pain, Decreased balance, Decreased strength, Impaired sensation, Decreased mobility, Decreased range of motion  Visit Diagnosis: Muscle weakness (generalized)  Other abnormalities of gait and mobility  Unsteadiness on feet  Other lack of coordination  Other disturbances of skin sensation  Pain in left foot     Problem List Patient Active Problem List   Diagnosis Date Noted   Smoker 05/06/2016   Bipolar disorder ([place]) 05/06/2016    Vanessa Mosheim, PT, DPT 08/14/21 4:56 PM   [facility] [address] [place], [place], [address] Phone: [phone]   Fax:  [phone]  Name: [name] MRN: [id number] Date of Birth: [birth year]

## 2021-08-18 ENCOUNTER — Ambulatory Visit: Payer: Medicaid Other

## 2021-08-18 ENCOUNTER — Other Ambulatory Visit: Payer: Self-pay

## 2021-08-18 DIAGNOSIS — R208 Other disturbances of skin sensation: Secondary | ICD-10-CM

## 2021-08-18 DIAGNOSIS — M79602 Pain in left arm: Secondary | ICD-10-CM

## 2021-08-18 DIAGNOSIS — R2681 Unsteadiness on feet: Secondary | ICD-10-CM

## 2021-08-18 DIAGNOSIS — M6281 Muscle weakness (generalized): Secondary | ICD-10-CM | POA: Diagnosis not present

## 2021-08-18 DIAGNOSIS — R2689 Other abnormalities of gait and mobility: Secondary | ICD-10-CM

## 2021-08-18 DIAGNOSIS — M25642 Stiffness of left hand, not elsewhere classified: Secondary | ICD-10-CM

## 2021-08-18 DIAGNOSIS — M25632 Stiffness of left wrist, not elsewhere classified: Secondary | ICD-10-CM

## 2021-08-18 DIAGNOSIS — M79672 Pain in left foot: Secondary | ICD-10-CM

## 2021-08-18 DIAGNOSIS — R278 Other lack of coordination: Secondary | ICD-10-CM

## 2021-08-18 NOTE — Therapy (Signed)
[place], [place], [address] Phone: [phone]   Fax:  [phone]  Physical Therapy Treatment/ Discharge Summary  Patient Details  Name: KAMSIYOCHUKWU BUIST MRN: [id number] Date of Birth: 20-Jun-1971 Referring Provider (PT): Erle Crocker   Encounter Date: 08/18/2021   PT End of Session - 08/18/21 1021     Visit Number 8    Number of Visits 9    Date for PT Re-Evaluation 08/20/21    Authorization Type [place] MCD    Authorization Time Period Authorized for 8 visits from 07/24/2021 to 09/17/2021    Authorization - Visit Number 4    Authorization - Number of Visits 8    PT Start Time 1000    PT Stop Time 1025    PT Time Calculation (min) 25 min    Activity Tolerance Patient tolerated treatment well    Behavior During Therapy [facility] for tasks assessed/performed             Past Medical History:  Diagnosis Date   Bipolar 1 disorder ([place])    followed by psychiatry   Chronic kidney disease    hx of short term dialysis   Compartment syndrome ([place])    left arm   ETOH abuse    Foot drop, left    Foot drop, left foot    Polysubstance abuse ([place])    history of   Smoker    Swallowing difficulty    cannot swallow pills   Teeth decayed    Vitamin D deficiency     Past Surgical History:  Procedure Laterality Date   arm surgery Left    compartment syndrome surgeries   CESAREAN SECTION     fetal distress   COLONOSCOPY  11/07/2020   HAND SURGERY     for fracture   TONSILLECTOMY     TOOTH EXTRACTION Bilateral 10/26/2018   Procedure: MULTIPLE EXTRACTION # 18, 21, 22, 27, 28;  Surgeon: Diona Browner, DDS;  Location: [facility];  Service: Oral Surgery;  Laterality: Bilateral;    There were no vitals filed for this visit.   Subjective Assessment - 08/18/21 1003     Subjective Pt reports regular HEP adherence, as well as wearing her AFO. She reports feeling like she has made  excellent progress in this round of PT and has a better understanding of her underlying pathology. She reports feeling ready to be discharged to independently progress her HEP.    Pertinent History See PMH above    Limitations Lifting;Standing;Walking;House hold activities    How long can you sit comfortably? 20 minutes until L LE N/T increases    How long can you stand comfortably? 25 minutes    How long can you walk comfortably? 30 minutes    Diagnostic tests 06/04/2021: DG L foot: IMPRESSION:  No acute bony findings.    Currently in Pain? No/denies    Pain Score 0-No pain                [facility] PT Assessment - 08/18/21 0001       AROM   Left Ankle Dorsiflexion 0    Left Ankle Plantar Flexion 52    Left Ankle Inversion 16    Left Ankle Eversion 20      Strength   Right Hip Flexion 5/5    Right Hip Extension 4+/5    Right Hip ABduction 5/5    Left Hip Flexion 4/5    Left Hip  Extension 4/5    Left Hip ABduction 4/5    Right Knee Flexion 5/5    Right Knee Extension 5/5    Left Knee Flexion 5/5    Left Knee Extension 5/5    Right Ankle Dorsiflexion 5/5    Right Ankle Plantar Flexion 5/5    Right Ankle Inversion 5/5    Right Ankle Eversion 5/5    Left Ankle Dorsiflexion 4/5    Left Ankle Plantar Flexion 4+/5    Left Ankle Inversion 4/5    Left Ankle Eversion 4/5                                    PT Education - 08/18/21 1020     Education Details Pt educated on progress made in PT so far utilizing objective measures. Educated on importance of continued HEP adherence.    Person(s) Educated Patient    Methods Explanation    Comprehension Verbalized understanding              PT Short Term Goals - 07/17/21 1711       PT SHORT TERM GOAL #1   Title The pt will report understanding and adherence to her HEP in order to promote independence in the management of her primary impairments    Baseline Pt reports adherence to HEP    Time 4     Period Weeks    Status Achieved    Target Date 07/23/21      PT SHORT TERM GOAL #2   Title -    Baseline -      PT SHORT TERM GOAL #3   Title -    Baseline -      PT SHORT TERM GOAL #4   Title -    Baseline -               PT Long Term Goals - 08/18/21 1027       PT LONG TERM GOAL #1   Title Pt will demonstrate BIL global hip strength =4+/5 or greater in order to progress LE strengthening regimen without limitation.    Baseline 4/5 to 5/5 in all tested hip musculature (08/18/2021)    Time 8    Period Weeks    Status Partially Met      PT LONG TERM GOAL #2   Title Pt will demonstrate L global ankle MMT of 4/5 or greater in order to promote WNL functional gait.    Baseline Achieved    Time 8    Period Weeks    Status Achieved      PT LONG TERM GOAL #3   Title Pt will improve 5xSTS to </= 13 seconds in order to demonstrate decreased fall risk.    Baseline 13sec (07/17/2021)    Time 8    Period Weeks    Status Achieved      PT LONG TERM GOAL #4   Title Pt will report ability to stand >25 minutes with <3/10 pain in order to wash dishes without limitation.    Baseline Pt reports being able to stand 25 minutes without pain.    Time 8    Period Weeks    Status Achieved      PT LONG TERM GOAL #5   Title Pt will achieve 5 degrees of L ankle dorsiflexion AROM in order to clear the foot during gait.    Baseline -20d  at eval, -3d on 07/17/2021, 0d on 08/18/2021    Time 4    Period Weeks    Status Partially Met                   Plan - 08/18/21 1021     Clinical Impression Statement Upon re-assessment of objective measures, the pt has made excellent progress in L ankle strength and AROM, as well as BIL global hip strength and functional standing/ walking ability. She has met or nearly met all of her rehab goals. Due to this and the pt feeling independent in the management of her sxs, she is discharged from PT at this time.    PT Next Visit Plan Pt is  discharged from PT at this time.    PT Home Exercise Plan [id number]    Consulted and Agree with Plan of Care Patient             Patient will benefit from skilled therapeutic intervention in order to improve the following deficits and impairments:     Visit Diagnosis: Muscle weakness (generalized)  Other abnormalities of gait and mobility  Unsteadiness on feet  Other lack of coordination  Other disturbances of skin sensation  Pain in left foot  Stiffness of left hand, not elsewhere classified  Stiffness of left wrist, not elsewhere classified  Pain in left arm     Problem List Patient Active Problem List   Diagnosis Date Noted   Smoker 05/06/2016   Bipolar disorder [facility]) 05/06/2016     [facility] [place], [place], [address] Phone: [phone]   Fax:  [phone]  Name: SOPHY MESLER MRN: [id number] Date of Birth: [birth year]  PHYSICAL THERAPY DISCHARGE SUMMARY  Visits from Start of Care: 8  Current functional level related to goals / functional outcomes: Pt has met or partially met all of her rehab goals.   Remaining deficits: Mild weakness in L ankle/ hip   Education / Equipment: HEP   Patient agrees to discharge. Patient goals were met. Patient is being discharged due to meeting the stated rehab goals.  Vanessa McKee, PT, DPT 08/18/21 10:35 AM

## 2021-11-10 ENCOUNTER — Other Ambulatory Visit: Payer: Self-pay | Admitting: Family Medicine

## 2021-11-10 DIAGNOSIS — Z1231 Encounter for screening mammogram for malignant neoplasm of breast: Secondary | ICD-10-CM

## 2021-12-11 ENCOUNTER — Ambulatory Visit
Admission: RE | Admit: 2021-12-11 | Discharge: 2021-12-11 | Disposition: A | Discharge status: Home / Self Care | Payer: Medicare Other | Source: Ambulatory Visit | Attending: Family Medicine | Admitting: Family Medicine

## 2021-12-11 DIAGNOSIS — Z1231 Encounter for screening mammogram for malignant neoplasm of breast: Secondary | ICD-10-CM

## 2021-12-12 ENCOUNTER — Other Ambulatory Visit: Payer: Self-pay | Admitting: Family Medicine

## 2021-12-12 DIAGNOSIS — R928 Other abnormal and inconclusive findings on diagnostic imaging of breast: Secondary | ICD-10-CM

## 2021-12-26 ENCOUNTER — Encounter: Payer: Self-pay | Admitting: Cardiology

## 2021-12-26 ENCOUNTER — Ambulatory Visit: Payer: Medicare HMO | Admitting: Cardiology

## 2021-12-26 ENCOUNTER — Inpatient Hospital Stay: Payer: Medicare HMO

## 2021-12-26 ENCOUNTER — Other Ambulatory Visit: Payer: Self-pay

## 2021-12-26 VITALS — BP 105/73 | HR 80 | Temp 98.2°F | Resp 16 | Ht 61.0 in | Wt 109.2 lb

## 2021-12-26 DIAGNOSIS — Z720 Tobacco use: Secondary | ICD-10-CM

## 2021-12-26 DIAGNOSIS — R0789 Other chest pain: Secondary | ICD-10-CM

## 2021-12-26 DIAGNOSIS — R002 Palpitations: Secondary | ICD-10-CM

## 2021-12-26 DIAGNOSIS — R0609 Other forms of dyspnea: Secondary | ICD-10-CM

## 2021-12-26 NOTE — Progress Notes (Signed)
Primary Physician/Referring:  Hayden Rasmussen, MD  Patient ID: [name], female    DOB: 1971-06-20, 51 y.o.   MRN: [id number]  Chief Complaint  Patient presents with   Palpitations   New Patient (Initial Visit)    Referred by Horald Pollen, MD   HPI:    [name]  is a 51 y.o. Caucasian female who I had last seen in 2016 for palpitations, atypical chest pain, short PR interval on the EKG, who had essentially normal treadmill stress test in 2015 and a normal echocardiogram in 2014.  She called our office to make an appointment as she was having more frequent episodes of palpitations, described as sudden onset and sudden offset.  I reviewed her prior EKG from 2014 and 2015, she clearly had short PR interval with delta wave.  Past medical history significant for bipolar disorder, polysubstance abuse including cocaine, tobacco use disorder, smokes a pack of cigarettes a day and excess alcohol use, drinks about 6 beers or more a day.  Patient states that for the past 2 to 3 months she has been having frequent episodes of palpitations that are sudden in onset and offset lasting anywhere from few minutes to sometimes 30 to 40 minutes.  She does not feel well during these episodes.  She was also complaining of chest pain and feels like a band across her chest, worse when she takes a deep breath or presses on her chest.  No hemoptysis, no leg edema.  No recent long travel.  Past Medical History:  Diagnosis Date   Bipolar 1 disorder ([place])    followed by psychiatry   Chronic kidney disease    hx of short term dialysis   Compartment syndrome ([place])    left arm   ETOH abuse    Foot drop, left    Foot drop, left foot    Polysubstance abuse ([place])    history of   Smoker    Swallowing difficulty    cannot swallow pills   Teeth decayed    Vitamin D deficiency    Past Surgical History:  Procedure Laterality Date   arm surgery Left    compartment syndrome surgeries    CESAREAN SECTION     fetal distress   COLONOSCOPY  11/07/2020   HAND SURGERY     for fracture   TONSILLECTOMY     TOOTH EXTRACTION Bilateral 10/26/2018   Procedure: MULTIPLE EXTRACTION # 18, 21, 22, 27, 28;  Surgeon: Diona Browner, DDS;  Location: [facility];  Service: Oral Surgery;  Laterality: Bilateral;   Family History  Problem Relation Age of Onset   Cancer Mother        OVARIAN, NON-HODGKINS   Colon cancer Neg Hx    Pancreatic cancer Neg Hx    Esophageal cancer Neg Hx    Rectal cancer Neg Hx    Stomach cancer Neg Hx     Social History   Tobacco Use   Smoking status: Every Day    Packs/day: 1.00    Years: 40.00    Pack years: 40.00    Types: Cigarettes   Smokeless tobacco: Never  Substance Use Topics   Alcohol use: Yes    Comment: beer 2-4 per day   Marital Status: Single  ROS  Review of Systems  Cardiovascular:  Positive for dyspnea on exertion and palpitations. Negative for chest pain and leg swelling.  Respiratory:  Positive for cough.   Objective  Blood pressure 105/73,  pulse 80, temperature 98.2 F (36.8 C), temperature source Temporal, resp. rate 16, height 5\' 1"  (1.549 m), weight 109 lb 3.2 oz (49.5 kg), SpO2 94 %. Body mass index is 20.63 kg/m.  Vitals with BMI 12/26/2021 07/13/2021 06/04/2021  Height 5\' 1"  - -  Weight 109 lbs 3 oz - -  BMI 67.89 - -  Systolic 381 017 510  Diastolic 73 78 73  Pulse 80 88 86    Physical Exam Constitutional:      Appearance: She is underweight.  Neck:     Vascular: No carotid bruit or JVD.  Cardiovascular:     Rate and Rhythm: Normal rate and regular rhythm.     Pulses: Intact distal pulses.     Heart sounds: Normal heart sounds. No murmur heard.   No gallop.  Pulmonary:     Effort: Pulmonary effort is normal.     Breath sounds: Normal breath sounds.  Abdominal:     General: Bowel sounds are normal.     Palpations: Abdomen is soft.  Musculoskeletal:     Right lower leg: No edema.     Left lower  leg: No edema.     Medications and allergies  No Known Allergies   Medication list after today's encounter   Current Outpatient Medications  Medication Instructions   FLUoxetine (PROZAC) 20 mg, Oral, Daily   traZODone (DESYREL) 150 mg, Oral, Daily at bedtime   Vraylar 3 mg, Oral, Daily    Laboratory examination:   External labs:   23 labs 07/11/2019:  Ethanol level 18, elevated.  Sodium 140, potassium 3.7, serum glucose 87 mg, BUN 7, creatinine 0.72, LFTs normal.  A1c 5.1%.  Hb 14.1/HCT 41.0, platelets 290, normal indicis.  Urine drug screen positive for cocaine.  Radiology:     Cardiac Studies:   Echo- 03/16/13 1.Left ventricular cavity is normal in size.   Normal global wall motion.   Normal systolic global function.   Calculated EF 66%. 2.Tricuspid valve structurally normal.   Mild tricuspid regurgitation.  Treadmill Exercise stress 11/05/2014: Indications: Assessment of Chest Pain. Conclusions: Negative for ischemia. Normal exercise tolerence. The patient exercised according to the [facility]   protocol, Total time recorded    9    Min.    22    sec. achieving a max heart rate of 180  which was 101% of MPHR for age and  10.3 METS of work. Baseline NIBP was . Peak NIBP was 100/60 MaxSysp was: 152 MaxDiasp was: 60. The baseline ECG showed NSR, P-pulmonale. IRBBB. During exercise there was no ST-T changes of ischemia. Symptoms: MPHR achieved (100%). Mild dyspnea. Arrhythmia: None. Continue primary prevention  Event monitor 30 days 07/30/2015: Predominant rhythm is normal sinus rhythm.  Symptoms of palpitation correlated with normal sinus rhythm. 3 auto detected event revealed sinus tachycardia/SVT at rate of 142 bpm, 169 bpm and 156 bpm.  EKG:   EKG 12/26/2021: Normal sinus rhythm at rate of 81 bpm, incomplete right bundle branch block. Compared to 03/03/2013, short PR interval with delta wave in the lateral leads is not present.  Also short PR interval and delta wave not  noted on 10/22/2014. Assessment     ICD-10-CM   1. Palpitations  R00.2 EKG 12-Lead    LONG TERM MONITOR (3-14 DAYS)    2. Chest pain, musculoskeletal  R07.89     3. Tobacco use  Z72.0     4. Dyspnea on exertion  R06.09 [facility] ECHOCARDIOGRAM COMPLETE  There are no discontinued medications.  No orders of the defined types were placed in this encounter.  Orders Placed This Encounter  Procedures   LONG TERM MONITOR (3-14 DAYS)    Standing Status:   Future    Number of Occurrences:   1    Order Specific Question:   Where should this test be performed?    Answer:   [facility]    Order Specific Question:   Does the patient have an implanted cardiac device?    Answer:   No    Order Specific Question:   Prescribed days of wear    Answer:   16    Order Specific Question:   Type of enrollment    Answer:   Clinic Enrollment   EKG 12-Lead   [facility] ECHOCARDIOGRAM COMPLETE    Standing Status:   Future    Standing Expiration Date:   12/26/2022   Recommendations:   [name] is a 51 y.o. Caucasian female who I had last seen in 2016 for palpitations, atypical chest pain, short PR interval on the EKG, who had essentially normal treadmill stress test in 2015 and a normal echocardiogram in 2014.  She called our office to make an appointment as she was having more frequent episodes of palpitations, described as sudden onset and sudden offset.  I reviewed her prior EKG from 2014 and 2015, she clearly had short PR interval with delta wave.  Past medical history significant for bipolar disorder, polysubstance abuse including cocaine, tobacco use disorder, smokes a pack of cigarettes a day and excess alcohol use, drinks about 6 beers or more a day.  Will perform extended EKG monitoring for 2 weeks and have like to see her back then.  Her chest pain symptoms are clearly musculoskeletal and reproducible.  She is also been diagnosed with severe osteoporosis.  She is still smoking about a  pack of cigarettes a day, smoking cessation discussed.  She is drinking anywhere from 4-6 and sometimes more beers a day, previously alcoholic, again discussed with her regarding reducing alcohol intake but more importantly complete cessation of smoking.  She is still using cocaine, last use was about a month ago.  I do not have her recent labs from PCP, but I am sure she probably has had thyroid function testing and if not I would recommend that she obtain TSH.  I would like to see her back in 3 to 4 weeks for follow-up.    Adrian Prows, MD, [facility] 12/26/2021, [place] PM Office: [phone]

## 2021-12-29 ENCOUNTER — Ambulatory Visit: Payer: Medicare HMO

## 2021-12-29 ENCOUNTER — Other Ambulatory Visit: Payer: Self-pay

## 2021-12-29 DIAGNOSIS — R0609 Other forms of dyspnea: Secondary | ICD-10-CM

## 2022-01-01 ENCOUNTER — Other Ambulatory Visit: Payer: Self-pay

## 2022-01-01 ENCOUNTER — Ambulatory Visit
Admission: RE | Admit: 2022-01-01 | Discharge: 2022-01-01 | Disposition: A | Discharge status: Home / Self Care | Payer: Medicare HMO | Source: Ambulatory Visit | Attending: Family Medicine | Admitting: Family Medicine

## 2022-01-01 DIAGNOSIS — R928 Other abnormal and inconclusive findings on diagnostic imaging of breast: Secondary | ICD-10-CM

## 2022-01-15 ENCOUNTER — Telehealth: Payer: Self-pay | Admitting: Cardiology

## 2022-01-15 NOTE — Telephone Encounter (Signed)
Patient says she had to mail back a heart monitor, and she has not received the new one in the mail yet. She says if someone can give her the 1-800 # then she would call and inquire.

## 2022-01-26 ENCOUNTER — Ambulatory Visit: Payer: Medicare HMO | Admitting: Cardiology

## 2022-02-05 ENCOUNTER — Encounter: Payer: Self-pay | Admitting: Cardiology

## 2022-02-05 ENCOUNTER — Ambulatory Visit: Payer: Medicare HMO | Admitting: Cardiology

## 2022-02-05 ENCOUNTER — Other Ambulatory Visit: Payer: Self-pay

## 2022-02-05 VITALS — BP 117/78 | HR 101 | Temp 98.7°F | Resp 16 | Ht 61.0 in | Wt 108.8 lb

## 2022-02-05 MED ORDER — ATORVASTATIN CALCIUM 10 MG PO TABS: 10.0000 mg | ORAL_TABLET | Freq: Every day | ORAL | 1 refills | Status: DC

## 2022-02-05 NOTE — Patient Instructions (Signed)
PAC = Premature atrial complexes: These arise from the upper chamber of the heart.  These are very common and are not dangerous.  Extra skipped beat coming from the upper chamber (atrium) and mostly are life altering (nuisance) than life threatening. ? ?There may not be any specific reasons for this, however patients with excessive caffeine, stimulants, anxiety, lack of sleep, alcohol or thyroid problems can have these episodes.  ? ?

## 2022-02-05 NOTE — Progress Notes (Signed)
? ?Primary Physician/Referring:  Hayden Rasmussen, MD ? ?Patient ID: [name], female    DOB: 01-22-1971, 51 y.o.   MRN: [id number] ? ?Chief Complaint  ?Patient presents with  ? Results  ?  monitor  ? Palpitations  ? Follow-up  ? ?HPI:   ? ?[name]  is a 51 y.o. Caucasian female who I had last seen in 2016 for palpitations, atypical chest pain, short PR interval on the EKG, who had essentially normal treadmill stress test in 2015 and a normal echocardiogram in 2014.  She called our office to make an appointment as she was having more frequent episodes of palpitations, described as sudden onset and sudden offset.  I reviewed her prior EKG from 2014 and 2015, she clearly had short PR interval with delta wave.  Past medical history significant for bipolar disorder, polysubstance abuse including cocaine, tobacco use disorder, smokes a pack of cigarettes a day and excess alcohol use, drinks about 6 beers or more a day. ? ?Patient states that for the past 2 to 3 months she has been having frequent episodes of palpitations that are sudden in onset and offset lasting anywhere from few minutes to sometimes 30 to 40 minutes.  She does not feel well during these episodes. ? ?She was also complaining of chest pain and feels like a band across her chest, worse when she takes a deep breath or presses on her chest.  No hemoptysis, no leg edema.  No recent long travel. ? ?Past Medical History:  ?Diagnosis Date  ? Bipolar 1 disorder ([place])   ? followed by psychiatry  ? Chronic kidney disease   ? hx of short term dialysis  ? Compartment syndrome ([place])   ? left arm  ? ETOH abuse   ? Foot drop, left   ? Foot drop, left foot   ? Polysubstance abuse ([place])   ? history of  ? Smoker   ? Swallowing difficulty   ? cannot swallow pills  ? Teeth decayed   ? Vitamin D deficiency   ? ?Past Surgical History:  ?Procedure Laterality Date  ? arm surgery Left   ? compartment syndrome surgeries  ? CESAREAN SECTION    ? fetal  distress  ? COLONOSCOPY  11/07/2020  ? HAND SURGERY    ? for fracture  ? TONSILLECTOMY    ? TOOTH EXTRACTION Bilateral 10/26/2018  ? Procedure: MULTIPLE EXTRACTION # 18, 21, 22, 27, 28;  Surgeon: Diona Browner, DDS;  Location: [facility];  Service: Oral Surgery;  Laterality: Bilateral;  ? ?Family History  ?Problem Relation Age of Onset  ? Cancer Mother   ?     OVARIAN, NON-HODGKINS  ? Heart attack Brother 68  ? Heart attack Maternal Aunt   ? Heart attack Paternal Grandmother   ?     All 4 brothers also had HA and died  ? Colon cancer Neg Hx   ? Pancreatic cancer Neg Hx   ? Esophageal cancer Neg Hx   ? Rectal cancer Neg Hx   ? Stomach cancer Neg Hx   ?  ?Social History  ? ?Tobacco Use  ? Smoking status: Every Day  ?  Packs/day: 1.00  ?  Years: 40.00  ?  Pack years: 40.00  ?  Types: Cigarettes  ? Smokeless tobacco: Never  ?Substance Use Topics  ? Alcohol use: Yes  ?  Comment: beer 2-4 per day  ? ?Marital Status: Single  ?ROS  ?Review of Systems  ?  Cardiovascular:  Positive for dyspnea on exertion and palpitations. Negative for chest pain and leg swelling.  ?Respiratory:  Positive for cough.   ?Objective  ?Blood pressure 117/78, pulse (!) 101, temperature 98.7 ?F (37.1 ?C), temperature source Temporal, resp. rate 16, height '5\' 1"'$  (1.549 m), weight 108 lb 12.8 oz (49.4 kg), SpO2 96 %. Body mass index is 20.56 kg/m?.  ?Vitals with BMI 02/05/2022 12/26/2021 07/13/2021  ?Height '5\' 1"'$  '5\' 1"'$  -  ?Weight 108 lbs 13 oz 109 lbs 3 oz -  ?BMI 20.57 20.64 -  ?Systolic 275 170 017  ?Diastolic 78 73 78  ?Pulse 101 80 88  ?  ?Physical Exam ?Constitutional:   ?   Appearance: She is underweight.  ?Neck:  ?   Vascular: No carotid bruit or JVD.  ?Cardiovascular:  ?   Rate and Rhythm: Normal rate and regular rhythm.  ?   Pulses: Intact distal pulses.  ?   Heart sounds: Normal heart sounds. No murmur heard. ?  No gallop.  ?Pulmonary:  ?   Effort: Pulmonary effort is normal.  ?   Breath sounds: Normal breath sounds.  ?Abdominal:  ?    General: Bowel sounds are normal.  ?   Palpations: Abdomen is soft.  ?Musculoskeletal:  ?   Right lower leg: No edema.  ?   Left lower leg: No edema.  ?  ? ?Medications and allergies  ?No Known Allergies  ? ?Medication list after today's encounter  ? ?Current Outpatient Medications  ?Medication Instructions  ? atorvastatin (LIPITOR) 10 mg, Oral, Daily  ? FLUoxetine (PROZAC) 20 mg, Oral, Daily  ? traZODone (DESYREL) 150 mg, Oral, Daily at bedtime  ? Vraylar 3 mg, Oral, Daily  ? ? ?Laboratory examination:  ? ?External labs:  ? ?23 labs 07/11/2019: ? ?Ethanol level 18, elevated. ? ?Sodium 140, potassium 3.7, serum glucose 87 mg, BUN 7, creatinine 0.72, LFTs normal. ? ?A1c 5.1%. ? ?Hb 14.1/HCT 41.0, platelets 290, normal indicis. ? ?Urine drug screen positive for cocaine. ? ?Radiology:  ? ? ? ?Cardiac Studies:  ? ?Echo- 03/16/13 ?1.Left ventricular cavity is normal in size.   Normal global wall motion.   Normal systolic global function.   Calculated EF 66%. ?2.Tricuspid valve structurally normal.   Mild tricuspid regurgitation. ? ?Treadmill Exercise stress 11/05/2014: Indications: Assessment of Chest Pain. ?Conclusions: Negative for ischemia. Normal exercise tolerence. The patient exercised according to the [facility]   protocol, Total time recorded    9    Min.    22    sec. achieving a max heart rate of 180  which was 101% of MPHR for age and  10.3 METS of work. Baseline NIBP was . Peak NIBP was 100/60 MaxSysp was: 152 MaxDiasp was: 60. The baseline ECG showed NSR, P-pulmonale. IRBBB. During exercise there was no ST-T changes of ischemia. Symptoms: MPHR achieved (100%). Mild dyspnea. Arrhythmia: None. Continue primary prevention ? ?Zio Patch Extended out patient EKG monitoring 9 days starting 12/26/2021: ?Predominant rhythm is normal sinus rhythm.  Minimum heart rate 58, maximum heart rate 158 bpm during brief atrial tachycardia. ?Longest atrial tachycardia was 12.4 seconds at the rate of 158 bpm. ?Isolated PACs and PVCs were  noted. ?Patient events reveal normal sinus rhythm and artifact.  There was no atrial fibrillation, no high degree AV block.  ?No significant change from 07/30/2015. ? ?PCV ECHOCARDIOGRAM COMPLETE 12/29/2021  ?Left ventricle cavity is normal in size and wall thickness. Normal global wall motion. Normal LV systolic function with EF  50%. Normal diastolic filling pattern. ?Mild tricuspid regurgitation. ?No evidence of pulmonary hypertension. ?   ? ?EKG:  ? ?EKG 12/26/2021: Normal sinus rhythm at rate of 81 bpm, incomplete right bundle branch block. ?Compared to 03/03/2013, short PR interval with delta wave in the lateral leads is not present.  Also short PR interval and delta wave not noted on 10/22/2014. ?Assessment  ? ?  ICD-10-CM   ?1. Palpitations  R00.2   ?  ?2. Tobacco use  Z72.0 atorvastatin (LIPITOR) 10 MG tablet  ?  ?3. Family history of premature CAD Brother with 39 Y  Z82.49   ?  ?  ?There are no discontinued medications.  ?Meds ordered this encounter  ?Medications  ? atorvastatin (LIPITOR) 10 MG tablet  ?  Sig: Take 1 tablet (10 mg total) by mouth daily.  ?  Dispense:  90 tablet  ?  Refill:  1  ? ?No orders of the defined types were placed in this encounter. ? ?Recommendations:  ? ?[name] is a 51 y.o. Caucasian female who I had last seen in 2016 for palpitations, atypical chest pain, who had essentially normal treadmill stress test in 2015 and a normal echocardiogram in 2014.  She called our office to make an appointment as she was having more frequent episodes of palpitations, described as sudden onset and sudden offset.   Past medical history significant for bipolar disorder, polysubstance abuse including cocaine, tobacco use disorder, smokes a pack of cigarettes a day and excess alcohol use, drinks about 6 beers or more a day.  Her brother recently passed away at home suddenly and was presumed to have acute MI and weakness by his wife, he was 42 years of age. ? ?I reviewed her prior EKG from  2014 and 2015, she clearly had short PR interval with delta wave but present EKG is normal.  I also reviewed the results of the extended EKG monitoring, symptoms correlated with brief atrial tachycardia and

## 2022-02-16 NOTE — Progress Notes (Signed)
Labs 01/16/2022: ? ?Total cholesterol 215, triglycerides 93, HDL 94, LDL 102.  Non-HDL cholesterol 121. ? ?Hb 14.7/HCT 42.4, platelets 268, normal indicis. ? ?Sodium 139, potassium 4.0, BUN 12, creatinine 0.82, LFTs normal.  eGFR 83 mL.  Vitamin D60.7.  A1c 5.5%.  TSH normal at 2.42.

## 2022-05-17 ENCOUNTER — Other Ambulatory Visit: Payer: Self-pay | Admitting: Cardiology

## 2022-05-17 DIAGNOSIS — Z720 Tobacco use: Secondary | ICD-10-CM

## 2022-06-03 ENCOUNTER — Ambulatory Visit: Payer: Medicare HMO

## 2022-06-03 NOTE — Therapy (Signed)
OUTPATIENT PHYSICAL THERAPY EVALUATION   Patient Name: [name] MRN: [id number] DOB:[birth year], 51 y.o., female Today's Date: 06/04/2022   PT End of Session - 06/04/22 1346     Visit Number 1    Number of Visits 8    Date for PT Re-Evaluation 07/30/22    Authorization Type [employer] MCR    Progress Note Due on Visit 10    PT Start Time 1130    PT Stop Time 1215    PT Time Calculation (min) 45 min    Activity Tolerance Patient tolerated treatment well    Behavior During Therapy WFL for tasks assessed/performed             Past Medical History:  Diagnosis Date   Bipolar 1 disorder ([facility])    followed by psychiatry   Chronic kidney disease    hx of short term dialysis   Compartment syndrome ([facility])    left arm   ETOH abuse    Foot drop, left    Foot drop, left foot    Polysubstance abuse ([facility])    history of   Smoker    Swallowing difficulty    cannot swallow pills   Teeth decayed    Vitamin D deficiency    Past Surgical History:  Procedure Laterality Date   arm surgery Left    compartment syndrome surgeries   CESAREAN SECTION     fetal distress   COLONOSCOPY  11/07/2020   HAND SURGERY     for fracture   TONSILLECTOMY     TOOTH EXTRACTION Bilateral 10/26/2018   Procedure: MULTIPLE EXTRACTION # 18, 21, 22, 27, 28;  Surgeon: Ocie Doyne, DDS;  Location: [facility];  Service: Oral Surgery;  Laterality: Bilateral;   Patient Active Problem List   Diagnosis Date Noted   Smoker 05/06/2016   Bipolar disorder ([facility]) 05/06/2016    PCP: Dois Davenport, MD  REFERRING PROVIDER: Dois Davenport, MD  REFERRING DIAG: Drop foot evaluate, falls  THERAPY DIAG:  Pain in left ankle and joints of left foot  Muscle weakness (generalized)  Other abnormalities of gait and mobility  Rationale for Evaluation and Treatment Rehabilitation  ONSET DATE: Injury first occurred in 2019   SUBJECTIVE:  SUBJECTIVE STATEMENT: Patient reports she feels  she is still having drop foot on the left side, she has had PT in the past but now she feels she is regressing a little bit. She has had a few falls since last therapy and most recent was about 6 months ago where she broke her left arm. She does have pain mostly on the bottom of her foot, and she has to walk with a shoe on or else if she steps on something it is excruciating. 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 Patient also has difficulty with stairs at home and with any long distance walking such as when grocery shopping.  PERTINENT HISTORY: Bipolar, CKD, left drop foot  PAIN:  Are you having pain? Yes:  NPRS scale: 0/10 (pain increased with walking) Pain location: Left foot (mostly bottom of foot) Pain description: Constant, stabbing Aggravating factors: Standing, walking, stepping on objects without shoes on Relieving factors: Medication  PRECAUTIONS: Fall  WEIGHT BEARING RESTRICTIONS No  FALLS:  Has  patient fallen in last 6 months? Yes. Number of falls 1  LIVING ENVIRONMENT: Lives with: lives with their family Lives in: House/apartment Stairs: Yes: Internal: 1 flight steps; can reach both  OCCUPATION: Works taking care of someone  PLOF: Independent  PATIENT GOALS: Pain relief and improve walking   OBJECTIVE:  PATIENT SURVEYS:  FOTO 48% functional status  COGNITION: Overall cognitive status: Within functional limits for tasks assessed     SENSATION: Patient reports sensation changes of left ankle and foot  MUSCLE LENGTH: Calf flexibility deficit  PALPATION: Patient reports generalized left ankle and foot tenderness primarily to plantar aspect of forefoot  LOWER EXTREMITY ROM:  Active ROM Right eval Left eval  Ankle dorsiflexion - -18 deg PROM 0 deg  Ankle plantarflexion - 60   Ankle inversion - 15  Ankle eversion - 20   LOWER EXTREMITY MMT:  MMT Right eval Left eval  Hip flexion  -  Hip extension  -  Hip abduction  -  Knee flexion  -  Knee extension  -  Ankle dorsiflexion 5 2  Ankle plantarflexion 5 3  Ankle inversion 5 3-  Ankle eversion 5 4   FUNCTIONAL TESTS:  Single leg stance: Right - 30+ seconds, Left - 4 seconds  GAIT: Distance walked: 50 ft Assistive device utilized: None Level of assistance: Complete Independence Comments: Slight steppage gait on left due to drop foot   TODAY'S TREATMENT: Longsitting calf stretch with strap 2 x 30 sec Longsitting ankle eversion and plantarflexion with yellow x 10 Seated figure-4 ankle inversion with yellow x 10 Seated ankle dorsiflexion with yellow x 10 Tandem stance x 30 sec each SLS x 10 sec   PATIENT EDUCATION:  Education details: Exam findings, POC, HEP Person educated: Patient Education method: Explanation, Demonstration, Tactile cues, Verbal cues, and Handouts Education comprehension: verbalized understanding, returned demonstration, verbal cues required, tactile cues required, and needs further education  HOME EXERCISE PROGRAM: Access Code: [email or web address]   ASSESSMENT: CLINICAL IMPRESSION: Patient is a 51 y.o. female who was seen today for physical therapy evaluation and treatment for left foot drop and previous falls. She demonstrates gross limitations in left ankle motion and strength resulting in gait and impairments. She reports difficulty and increased pain with walking and being on her feet which limits her work and ability to perform daily tasks.    OBJECTIVE IMPAIRMENTS Abnormal gait, decreased activity tolerance, decreased balance, decreased ROM, decreased strength, impaired flexibility, impaired sensation, and pain.   ACTIVITY LIMITATIONS standing, squatting, stairs, locomotion level, and caring for others  PARTICIPATION LIMITATIONS: meal prep, cleaning, laundry, shopping,  community activity, and occupation  PERSONAL FACTORS Fitness, Past/current experiences, Social background, and Time since onset of injury/illness/exacerbation are also affecting patient's functional outcome.   REHAB POTENTIAL: Fair  CLINICAL DECISION MAKING: Stable/uncomplicated  EVALUATION COMPLEXITY: Low   GOALS: Goals reviewed with patient? Yes  SHORT TERM GOALS: Target date: 07/02/2022   Patient will be I with initial HEP in order to progress with therapy. Baseline: HEP provided at eval Goal status: INITIAL  2.  PT will review FOTO with patient by 3rd visit in order to understand expected progress and outcome with therapy. Baseline: Assessed at evaluation Goal status: INITIAL  3.  Patient will demonstrate -10 deg left ankle DF AROM in order to improve gait and reduce fall risk with walking due to catching toe. Baseline: -18 deg left ankle DF AROM Goal status: INITIAL  LONG TERM GOALS: Target date: 07/30/2022   Patient will be I  with final HEP to maintain progress from PT. Baseline: HEP provided at eval Goal status: INITIAL  2.  Patient will report >/= 60% status on FOTO to indicate improved functional ability. Baseline: 48% Goal status: INITIAL  3.  Patient will demonstrate left ankle DF strength 3/5 MMT in order to improve ability to lift foot while negotiating stairs. Baseline: Left ankle DF strength 2/5 MMT Goal status: INITIAL  4.  Patient will demonstrate left ankle DF AROM to 0 deg in order to normalize gait to improve ability to walking around grocery store and reduce risk for future falls. Baseline: -18 deg left ankle DF AROM Goal status: INITIAL  5.  Patient will exhibit left single leg stance >/= 10 sec in order to indicate improved balance and reduce fall risk due to ankle instability. Baseline: Left single leg stance 4 sec Goal status: INITIAL   PLAN: PT FREQUENCY: 1x/week  PT DURATION: 8 weeks  PLANNED INTERVENTIONS: Therapeutic exercises,  Therapeutic activity, Neuromuscular re-education, Balance training, Gait training, Patient/Family education, Self Care, Joint mobilization, Joint manipulation, Stair training, Aquatic Therapy, Dry Needling, Electrical stimulation, Cryotherapy, Moist heat, Taping, Manual therapy, and Re-evaluation  PLAN FOR NEXT SESSION: Review HEP and progress PRN, calf stretching, progressive ankle strengthening, balance training, general left LE strengthening and step training   Rosana Hoes, PT, DPT, LAT, ATC 06/04/22  1:48 PM Phone: [phone] Fax: [phone]  Referring diagnosis? M21.372 Treatment diagnosis? (if different than referring diagnosis) M25.572 What was this (referring dx) caused by? []  Surgery []  Fall [x]  Ongoing issue []  Arthritis []  Other: ____________  Laterality: []  Rt [x]  Lt []  Both  Check all possible CPT codes:  *CHOOSE 10 OR LESS*    []  97110 (Therapeutic Exercise)  []  92507 (SLP Treatment)  []  97112 (Neuro Re-ed)   []  92526 (Swallowing Treatment)   []  25366 (Gait Training)   []  [id number] (Cognitive Training, 1st 15 minutes) []  97140 (Manual Therapy)   []  97130 (Cognitive Training, each add'l 15 minutes)  []  97164 (Re-evaluation)                              []  Other, List CPT Code ____________  []  97530 (Therapeutic Activities)     []  97535 (Self Care)   [x]  All codes above (97110 - 97535)  []  97012 (Mechanical Traction)  []  97014 (E-stim Unattended)  [x]  97032 (E-stim manual)  []  97033 (Ionto)  []  97035 (Ultrasound) []  97750 (Physical Performance Training) [x]  [id number] (Aquatic Therapy) []  97016 (Vasopneumatic Device) []  [id number] (Paraffin) []  97034 (Contrast Bath) []  97597 (Wound Care 1st 20 sq cm) []  97598 (Wound Care each add'l 20 sq cm) []  97760 (Orthotic Fabrication, Fitting, Training Initial) []  [id number] (Prosthetic Management and Training Initial) []  [id number] (Orthotic or Prosthetic Training/ Modification Subsequent)

## 2022-06-04 ENCOUNTER — Encounter: Payer: Self-pay | Admitting: Physical Therapy

## 2022-06-04 ENCOUNTER — Ambulatory Visit: Payer: Medicare HMO | Attending: Family Medicine | Admitting: Physical Therapy

## 2022-06-04 ENCOUNTER — Other Ambulatory Visit: Payer: Self-pay

## 2022-06-04 DIAGNOSIS — M6281 Muscle weakness (generalized): Secondary | ICD-10-CM | POA: Insufficient documentation

## 2022-06-04 DIAGNOSIS — M25511 Pain in right shoulder: Secondary | ICD-10-CM | POA: Diagnosis present

## 2022-06-04 DIAGNOSIS — G8929 Other chronic pain: Secondary | ICD-10-CM | POA: Diagnosis present

## 2022-06-04 DIAGNOSIS — R2689 Other abnormalities of gait and mobility: Secondary | ICD-10-CM | POA: Insufficient documentation

## 2022-06-04 DIAGNOSIS — M25572 Pain in left ankle and joints of left foot: Secondary | ICD-10-CM | POA: Insufficient documentation

## 2022-06-04 NOTE — Patient Instructions (Signed)
Access Code: [email or web address] URL: [email or web address] Date: 06/04/2022 Prepared by: Hilda Blades  Exercises - Long Sitting Calf Stretch with Strap  - 2-3 x daily - 3 reps - 30 seconds hold - Seated Figure 4 Ankle Inversion with Resistance  - 1 x daily - 3 sets - 15 reps - Long Sitting Ankle Eversion with Resistance  - 1 x daily - 3 sets - 15 reps - Long Sitting Ankle Plantar Flexion with Resistance  - 1 x daily - 3 sets - 15 reps - Seated Ankle Dorsiflexion with Resistance  - 1 x daily - 3 sets - 15 reps - Tandem Stance  - 2-3 x daily - 3 reps - 30 seconds hold - Single Leg Stance  - 2-3 x daily - 3 reps - 10 seconds hold

## 2022-06-11 ENCOUNTER — Ambulatory Visit: Payer: Medicare HMO

## 2022-06-11 DIAGNOSIS — M6281 Muscle weakness (generalized): Secondary | ICD-10-CM

## 2022-06-11 DIAGNOSIS — M25572 Pain in left ankle and joints of left foot: Secondary | ICD-10-CM | POA: Diagnosis not present

## 2022-06-11 DIAGNOSIS — R2689 Other abnormalities of gait and mobility: Secondary | ICD-10-CM

## 2022-06-11 DIAGNOSIS — G8929 Other chronic pain: Secondary | ICD-10-CM

## 2022-06-11 NOTE — Therapy (Signed)
OUTPATIENT PHYSICAL THERAPY TREATMENT NOTE/ RE-EVALUATION   Patient Name: [name] MRN: [id number] DOB:[birth year], 51 y.o., female Today's Date: 06/11/2022  PCP: Hayden Rasmussen, MD REFERRING PROVIDER: Hayden Rasmussen, MD  END OF SESSION:   PT End of Session - 06/11/22 1139     Visit Number 2    Number of Visits 8    Date for PT Re-Evaluation 07/30/22    Authorization Type [employer] MCR    Progress Note Due on Visit 10    PT Start Time 1140   Pt arrived 10 minutes late to her appointment.   PT Stop Time 1210    PT Time Calculation (min) 30 min    Activity Tolerance Patient tolerated treatment well    Behavior During Therapy WFL for tasks assessed/performed             Past Medical History:  Diagnosis Date   Bipolar 1 disorder ([place])    followed by psychiatry   Chronic kidney disease    hx of short term dialysis   Compartment syndrome ([place])    left arm   ETOH abuse    Foot drop, left    Foot drop, left foot    Polysubstance abuse ([place])    history of   Smoker    Swallowing difficulty    cannot swallow pills   Teeth decayed    Vitamin D deficiency    Past Surgical History:  Procedure Laterality Date   arm surgery Left    compartment syndrome surgeries   CESAREAN SECTION     fetal distress   COLONOSCOPY  11/07/2020   HAND SURGERY     for fracture   TONSILLECTOMY     TOOTH EXTRACTION Bilateral 10/26/2018   Procedure: MULTIPLE EXTRACTION # 18, 21, 22, 27, 28;  Surgeon: Diona Browner, DDS;  Location: [facility];  Service: Oral Surgery;  Laterality: Bilateral;   Patient Active Problem List   Diagnosis Date Noted   Smoker 05/06/2016   Bipolar disorder ([place]) 05/06/2016    REFERRING DIAG: Drop foot evaluate, falls             right shoulder pain with abduction  THERAPY DIAG:  Pain in left ankle and joints of left foot - Plan: PT plan of care cert/re-cert  Muscle weakness (generalized) - Plan: PT plan of care cert/re-cert  Other  abnormalities of gait and mobility - Plan: PT plan of care cert/re-cert  Chronic right shoulder pain - Plan: PT plan of care cert/re-cert  Rationale for Evaluation and Treatment Rehabilitation  PERTINENT HISTORY: Bipolar, CKD, left drop foot  PRECAUTIONS: Fall  SUBJECTIVE: Pt reports 4/10 Rt shoulder pain today. She reports this has persisted for several years. She has a new referral for her shoulder pain that has been added to this POC. She reports varied adherence to her HEP.  PAIN:  Are you having pain? Yes:  NPRS scale: 4/10 (pain increased with walking) Pain location: Left foot (mostly bottom of foot) Pain description: Constant, stabbing Aggravating factors: Standing, walking, stepping on objects without shoes on Relieving factors: Medication   OBJECTIVE: (objective measures completed at initial evaluation unless otherwise dated)   PATIENT SURVEYS:  FOTO 48% functional status   COGNITION: Overall cognitive status: Within functional limits for tasks assessed                          SENSATION: Patient reports sensation changes of left ankle and foot  MUSCLE LENGTH: Calf flexibility deficit   PALPATION: Patient reports generalized left ankle and foot tenderness primarily to plantar aspect of forefoot   LOWER EXTREMITY ROM:   Active ROM Right eval Left eval  Ankle dorsiflexion - -18 deg PROM 0 deg  Ankle plantarflexion - 60  Ankle inversion - 15  Ankle eversion - 20    LOWER EXTREMITY MMT:   MMT Right eval Left eval  Hip flexion   -  Hip extension   -  Hip abduction   -  Knee flexion   -  Knee extension   -  Ankle dorsiflexion 5 2  Ankle plantarflexion 5 3  Ankle inversion 5 3-  Ankle eversion 5 4     UPPER EXTREMITY ROM:   A/PROM Right 06/11/2022 Left 06/11/2022  Shoulder flexion 180 (pain at mid-range of motion) 180  Shoulder abduction 180 180  Shoulder internal rotation 90 95  Shoulder external rotation 90 95  (Blank rows = not  tested)  UPPER EXTREMITY MMT:  MMT Right 06/11/2022 Left 06/11/2022  Shoulder flexion 4/5p! 4+/5  Shoulder abduction 5/5 5/5  Shoulder internal rotation 5/5 5/5  Shoulder external rotation 4+/5p! 5/5  Middle trapezius 3+/5 3/5  Lower trapezius 3+/5 3/5  Latissimus dorsi 3+/5 3+/5  Elbow flexion 5/5 5/5  Elbow extension 5/5 5/5  Grip strength (lbs)    (Blank rows = not tested)  SHOULDER SPECIAL TESTS (06/11/2022): Hawkins-Kennedy: (-) on Rt Neer's: (-) on Rt Crank test: (-) on Rt Biceps load I and II: (-) on Rt  JOINT MOBILITY TESTING (06/11/2022):  GHJ WNL BIL, CPA's hypomobile and painful through T3-T8  PALPATION (06/11/2022):  No TTP to Rt shoulder   FUNCTIONAL TESTS:  Single leg stance: Right - 30+ seconds, Left - 4 seconds   GAIT: Distance walked: 50 ft Assistive device utilized: None Level of assistance: Complete Independence Comments: Slight steppage gait on left due to drop foot     TODAY'S TREATMENT:  [facility] Adult PT Treatment:                                                DATE: 06/11/2022 Therapeutic Exercise: Seated BIL scaption lift with 3# dumbbells 3x10 Sidelying shoulder ER with 3# dumbbell 3x10 BIL Kickstand stance on Airex pad with Lt foot in front on the edge of the pad 4x30sec with CGA  Manual Therapy: N/A Neuromuscular re-ed: N/A Therapeutic Activity: Review FOTO with pt education Modalities: N/A Self Care: N/A   PATIENT EDUCATION:  Education details: Exam findings, POC, HEP Person educated: Patient Education method: Explanation, Demonstration, Tactile cues, Verbal cues, and Handouts Education comprehension: verbalized understanding, returned demonstration, verbal cues required, tactile cues required, and needs further education   HOME EXERCISE PROGRAM: Access Code: [email or web address] URL: [email or web address] Date: 06/11/2022 Prepared by: Vanessa Metamora  Exercises - Long Sitting Calf Stretch with Strap  - 2-3 x daily - 3  reps - 30 seconds hold - Seated Figure 4 Ankle Inversion with Resistance  - 1 x daily - 3 sets - 15 reps - Long Sitting Ankle Eversion with Resistance  - 1 x daily - 3 sets - 15 reps - Long Sitting Ankle Plantar Flexion with Resistance  - 1 x daily - 3 sets - 15 reps - Seated Ankle Dorsiflexion with Resistance  - 1 x daily - 3 sets - 15 reps - Tandem  Stance  - 2-3 x daily - 3 reps - 30 seconds hold - Single Leg Stance  - 2-3 x daily - 3 reps - 10 seconds hold  Added 06/11/2022 - Standing Shoulder External Rotation with Resistance  - 1 x daily - 7 x weekly - 3 sets - 10 reps - 3-sec hold     ASSESSMENT: CLINICAL IMPRESSION: Due to pt receiving an additional referral for Rt shoulder pain, a re-evaluation was performed today to adequately assess her shoulder impairments. Due to pt arriving 10 minutes late to her appointment, the session was truncated. Upon assessment, her primary impairments include global BIL parascapular weakness, painful and weak Rt shoulder flexion and ER MMT, hypomobile and painful thoracic passive accessory mobility, and pain at mid-range of Rt shoulder flexion AROM. Ruling up mild RTC tendinopathy. Ruling down significant tear due to negative special testing and relatively minor objective impairments. Pt will benefit from continued skilled PT to address her primary impairments and return to her prior level of function with less limitation.     OBJECTIVE IMPAIRMENTS Abnormal gait, decreased activity tolerance, decreased balance, decreased ROM, decreased strength, impaired flexibility, impaired sensation, and pain.    ACTIVITY LIMITATIONS standing, squatting, stairs, locomotion level, and caring for others   PARTICIPATION LIMITATIONS: meal prep, cleaning, laundry, shopping, community activity, and occupation   [place], Past/current experiences, Social background, and Time since onset of injury/illness/exacerbation are also affecting patient's functional  outcome.        GOALS: Goals reviewed with patient? Yes   SHORT TERM GOALS: Target date: 07/02/2022    Patient will be I with initial HEP in order to progress with therapy. Baseline: HEP provided at eval Goal status: INITIAL   2.  PT will review FOTO with patient by 3rd visit in order to understand expected progress and outcome with therapy. Baseline: Assessed at evaluation 06/11/2022: Reviewed  Goal status: ACHIEVED   3.  Patient will demonstrate -10 deg left ankle DF AROM in order to improve gait and reduce fall risk with walking due to catching toe. Baseline: -18 deg left ankle DF AROM Goal status: INITIAL   LONG TERM GOALS: Target date: 07/30/2022    Patient will be I with final HEP to maintain progress from PT. Baseline: HEP provided at eval Goal status: INITIAL   2.  Patient will report >/= 60% status on FOTO to indicate improved functional ability. Baseline: 48% Goal status: INITIAL   3.  Patient will demonstrate left ankle DF strength 3/5 MMT in order to improve ability to lift foot while negotiating stairs. Baseline: Left ankle DF strength 2/5 MMT Goal status: INITIAL   4.  Patient will demonstrate left ankle DF AROM to 0 deg in order to normalize gait to improve ability to walking around grocery store and reduce risk for future falls. Baseline: -18 deg left ankle DF AROM Goal status: INITIAL   5.  Patient will exhibit left single leg stance >/= 10 sec in order to indicate improved balance and reduce fall risk due to ankle instability. Baseline: Left single leg stance 4 sec Goal status: INITIAL   6. *New 06/11/2022* Pt will achieve BIL global parascapular strength of 4+/5 or greater in the prophylaxis of future mechanical shoulder/ upper back pain.   Baseline 97/20/2023: See MMT chart   Goal Status: INITIAL     PLAN: PT FREQUENCY: 1x/week   PT DURATION: 8 weeks   PLANNED INTERVENTIONS: Therapeutic exercises, Therapeutic activity, Neuromuscular re-education,  Balance training, Gait training,  Patient/Family education, Self Care, Joint mobilization, Joint manipulation, Stair training, Aquatic Therapy, Dry Needling, Electrical stimulation, Cryotherapy, Moist heat, Taping, Manual therapy, and Re-evaluation   PLAN FOR NEXT SESSION: calf stretching, progressive ankle strengthening, balance training, general left LE strengthening and step training, RTC/ parascapular strengthening    Vanessa Garrison, PT, DPT 06/11/22 12:10 PM

## 2022-06-18 ENCOUNTER — Ambulatory Visit: Payer: Medicare HMO

## 2022-06-18 DIAGNOSIS — G8929 Other chronic pain: Secondary | ICD-10-CM

## 2022-06-18 DIAGNOSIS — R2689 Other abnormalities of gait and mobility: Secondary | ICD-10-CM

## 2022-06-18 DIAGNOSIS — M6281 Muscle weakness (generalized): Secondary | ICD-10-CM

## 2022-06-18 DIAGNOSIS — M25572 Pain in left ankle and joints of left foot: Secondary | ICD-10-CM | POA: Diagnosis not present

## 2022-06-18 NOTE — Therapy (Signed)
OUTPATIENT PHYSICAL THERAPY TREATMENT NOTE/ RE-EVALUATION   Patient Name: [name] MRN: [id number] DOB:1971/01/17, 51 y.o., female Today's Date: 06/18/2022  PCP: Hayden Rasmussen, MD REFERRING PROVIDER: Hayden Rasmussen, MD  END OF SESSION:   PT End of Session - 06/18/22 1048     Visit Number 3    Number of Visits 8    Date for PT Re-Evaluation 07/30/22    Authorization Type [employer] MCR    Progress Note Due on Visit 10    PT Start Time 2671    PT Stop Time 1130    PT Time Calculation (min) 41 min    Activity Tolerance Patient tolerated treatment well    Behavior During Therapy WFL for tasks assessed/performed              Past Medical History:  Diagnosis Date   Bipolar 1 disorder ([place])    followed by psychiatry   Chronic kidney disease    hx of short term dialysis   Compartment syndrome ([place])    left arm   ETOH abuse    Foot drop, left    Foot drop, left foot    Polysubstance abuse ([place])    history of   Smoker    Swallowing difficulty    cannot swallow pills   Teeth decayed    Vitamin D deficiency    Past Surgical History:  Procedure Laterality Date   arm surgery Left    compartment syndrome surgeries   CESAREAN SECTION     fetal distress   COLONOSCOPY  11/07/2020   HAND SURGERY     for fracture   TONSILLECTOMY     TOOTH EXTRACTION Bilateral 10/26/2018   Procedure: MULTIPLE EXTRACTION # 18, 21, 22, 27, 28;  Surgeon: Diona Browner, DDS;  Location: [facility];  Service: Oral Surgery;  Laterality: Bilateral;   Patient Active Problem List   Diagnosis Date Noted   Smoker 05/06/2016   Bipolar disorder ([place]) 05/06/2016    REFERRING DIAG: Drop foot evaluate, falls             right shoulder pain with abduction  THERAPY DIAG:  Pain in left ankle and joints of left foot  Muscle weakness (generalized)  Other abnormalities of gait and mobility  Chronic right shoulder pain  Rationale for Evaluation and Treatment  Rehabilitation  PERTINENT HISTORY: Bipolar, CKD, left drop foot  PRECAUTIONS: Fall  SUBJECTIVE: Pt reports non-adherence to her initial shoulder HEP due to being very busy this week. She reports 2/10 Rt anterior shoulder pain today.  PAIN:  Are you having pain? Yes:  NPRS scale: 2/10 (pain increased with walking) Pain location: Left foot (mostly bottom of foot) Pain description: Constant, stabbing Aggravating factors: Standing, walking, stepping on objects without shoes on Relieving factors: Medication   OBJECTIVE: (objective measures completed at initial evaluation unless otherwise dated)   PATIENT SURVEYS:  FOTO 48% functional status   COGNITION: Overall cognitive status: Within functional limits for tasks assessed                          SENSATION: Patient reports sensation changes of left ankle and foot   MUSCLE LENGTH: Calf flexibility deficit   PALPATION: Patient reports generalized left ankle and foot tenderness primarily to plantar aspect of forefoot   LOWER EXTREMITY ROM:   Active ROM Right eval Left eval  Ankle dorsiflexion - -18 deg PROM 0 deg  Ankle plantarflexion - 60  Ankle inversion - 15  Ankle eversion - 20    LOWER EXTREMITY MMT:   MMT Right eval Left eval  Hip flexion   -  Hip extension   -  Hip abduction   -  Knee flexion   -  Knee extension   -  Ankle dorsiflexion 5 2  Ankle plantarflexion 5 3  Ankle inversion 5 3-  Ankle eversion 5 4     UPPER EXTREMITY ROM:   A/PROM Right 06/11/2022 Left 06/11/2022  Shoulder flexion 180 (pain at mid-range of motion) 180  Shoulder abduction 180 180  Shoulder internal rotation 90 95  Shoulder external rotation 90 95  (Blank rows = not tested)  UPPER EXTREMITY MMT:  MMT Right 06/11/2022 Left 06/11/2022  Shoulder flexion 4/5p! 4+/5  Shoulder abduction 5/5 5/5  Shoulder internal rotation 5/5 5/5  Shoulder external rotation 4+/5p! 5/5  Middle trapezius 3+/5 3/5  Lower trapezius 3+/5 3/5   Latissimus dorsi 3+/5 3+/5  Elbow flexion 5/5 5/5  Elbow extension 5/5 5/5  Grip strength (lbs)    (Blank rows = not tested)  SHOULDER SPECIAL TESTS (06/11/2022): Hawkins-Kennedy: (-) on Rt Neer's: (-) on Rt Crank test: (-) on Rt Biceps load I and II: (-) on Rt  JOINT MOBILITY TESTING (06/11/2022):  GHJ WNL BIL, CPA's hypomobile and painful through T3-T8  PALPATION (06/11/2022):  No TTP to Rt shoulder   FUNCTIONAL TESTS:  Single leg stance: Right - 30+ seconds, Left - 4 seconds   GAIT: Distance walked: 50 ft Assistive device utilized: None Level of assistance: Complete Independence Comments: Slight steppage gait on left due to drop foot     TODAY'S TREATMENT:  [facility] Adult PT Treatment:                                                DATE: 06/18/2022 Therapeutic Exercise: Standing corner pec stretch x53mn Standing single arm chest fly with 7# cable 2x10 BIL Kickstand stance on Airex pad with Lead foot on the edge of the pad and contralateral single arm Pallof press with 3# cable 2x10 BIL Standing BIL shoulder scaption with 3# cables 3x10 Standing heel raises on edge of Airex pad 3x15 Seated ankle inversion towel slide with 5# dumbbell x5 BIL Seated ankle eversion towel slide with 5# dumbbell x5 BIL Standing gastroc slant board stretch x245m Manual Therapy: N/A Neuromuscular re-ed: N/A Therapeutic Activity: N/A Modalities: N/A Self Care: N/A  [facility] Adult PT Treatment:                                                DATE: 06/11/2022 Therapeutic Exercise: Seated BIL scaption lift with 3# dumbbells 3x10 Sidelying shoulder ER with 3# dumbbell 3x10 BIL Kickstand stance on Airex pad with Lt foot in front on the edge of the pad 4x30sec with CGA  Manual Therapy: N/A Neuromuscular re-ed: N/A Therapeutic Activity: Review FOTO with pt education Modalities: N/A Self Care: N/A   PATIENT EDUCATION:  Education details: Exam findings, POC, HEP Person educated:  Patient Education method: Explanation, Demonstration, Tactile cues, Verbal cues, and Handouts Education comprehension: verbalized understanding, returned demonstration, verbal cues required, tactile cues required, and needs further education   HOME EXERCISE PROGRAM: Access Code: [email or web address] URL:  [email or web address] Date: 06/11/2022 Prepared by: Vanessa [place]  Exercises - Long Sitting Calf Stretch with Strap  - 2-3 x daily - 3 reps - 30 seconds hold - Seated Figure 4 Ankle Inversion with Resistance  - 1 x daily - 3 sets - 15 reps - Long Sitting Ankle Eversion with Resistance  - 1 x daily - 3 sets - 15 reps - Long Sitting Ankle Plantar Flexion with Resistance  - 1 x daily - 3 sets - 15 reps - Seated Ankle Dorsiflexion with Resistance  - 1 x daily - 3 sets - 15 reps - Tandem Stance  - 2-3 x daily - 3 reps - 30 seconds hold - Single Leg Stance  - 2-3 x daily - 3 reps - 10 seconds hold  Added 06/11/2022 - Standing Shoulder External Rotation with Resistance  - 1 x daily - 7 x weekly - 3 sets - 10 reps - 3-sec hold    Added 06/18/2022:  - Corner Pec Major Stretch  - 1 x daily - 7 x weekly - 2-minute hold - Ankle Inversion Eversion Towel Slide  - 1 x daily - 7 x weekly - 2 sets - 5 reps   ASSESSMENT: CLINICAL IMPRESSION: Pt responded well to all interventions today, demonstrating good form and no increase in pain with progressed exercises. She reports a therapeutic response to pec stretching and strengthening. Pt will continue to benefit from skilled PT to address her primary impairments and return to her prior level of function with less limitation.     OBJECTIVE IMPAIRMENTS Abnormal gait, decreased activity tolerance, decreased balance, decreased ROM, decreased strength, impaired flexibility, impaired sensation, and pain.    ACTIVITY LIMITATIONS standing, squatting, stairs, locomotion level, and caring for others   PARTICIPATION LIMITATIONS: meal prep, cleaning, laundry,  shopping, community activity, and occupation   [place], Past/current experiences, Social background, and Time since onset of injury/illness/exacerbation are also affecting patient's functional outcome.        GOALS: Goals reviewed with patient? Yes   SHORT TERM GOALS: Target date: 07/02/2022    Patient will be I with initial HEP in order to progress with therapy. Baseline: HEP provided at eval Goal status: INITIAL   2.  PT will review FOTO with patient by 3rd visit in order to understand expected progress and outcome with therapy. Baseline: Assessed at evaluation 06/11/2022: Reviewed  Goal status: ACHIEVED   3.  Patient will demonstrate -10 deg left ankle DF AROM in order to improve gait and reduce fall risk with walking due to catching toe. Baseline: -18 deg left ankle DF AROM Goal status: INITIAL   LONG TERM GOALS: Target date: 07/30/2022    Patient will be I with final HEP to maintain progress from PT. Baseline: HEP provided at eval Goal status: INITIAL   2.  Patient will report >/= 60% status on FOTO to indicate improved functional ability. Baseline: 48% Goal status: INITIAL   3.  Patient will demonstrate left ankle DF strength 3/5 MMT in order to improve ability to lift foot while negotiating stairs. Baseline: Left ankle DF strength 2/5 MMT Goal status: INITIAL   4.  Patient will demonstrate left ankle DF AROM to 0 deg in order to normalize gait to improve ability to walking around grocery store and reduce risk for future falls. Baseline: -18 deg left ankle DF AROM Goal status: INITIAL   5.  Patient will exhibit left single leg stance >/= 10 sec in order to indicate improved balance and  reduce fall risk due to ankle instability. Baseline: Left single leg stance 4 sec Goal status: INITIAL   6. *New 06/11/2022* Pt will achieve BIL global parascapular strength of 4+/5 or greater in the prophylaxis of future mechanical shoulder/ upper back pain.   Baseline  97/20/2023: See MMT chart   Goal Status: INITIAL     PLAN: PT FREQUENCY: 1x/week   PT DURATION: 8 weeks   PLANNED INTERVENTIONS: Therapeutic exercises, Therapeutic activity, Neuromuscular re-education, Balance training, Gait training, Patient/Family education, Self Care, Joint mobilization, Joint manipulation, Stair training, Aquatic Therapy, Dry Needling, Electrical stimulation, Cryotherapy, Moist heat, Taping, Manual therapy, and Re-evaluation   PLAN FOR NEXT SESSION: calf stretching, progressive ankle strengthening, balance training, general left LE strengthening and step training, RTC/ parascapular strengthening    Vanessa , PT, DPT 06/18/22 11:30 AM

## 2022-06-25 ENCOUNTER — Ambulatory Visit: Payer: Medicare HMO | Attending: Family Medicine

## 2022-06-25 DIAGNOSIS — R2689 Other abnormalities of gait and mobility: Secondary | ICD-10-CM | POA: Insufficient documentation

## 2022-06-25 DIAGNOSIS — M25572 Pain in left ankle and joints of left foot: Secondary | ICD-10-CM | POA: Insufficient documentation

## 2022-06-25 DIAGNOSIS — G8929 Other chronic pain: Secondary | ICD-10-CM | POA: Diagnosis present

## 2022-06-25 DIAGNOSIS — M25511 Pain in right shoulder: Secondary | ICD-10-CM | POA: Insufficient documentation

## 2022-06-25 DIAGNOSIS — M6281 Muscle weakness (generalized): Secondary | ICD-10-CM | POA: Insufficient documentation

## 2022-06-25 NOTE — Therapy (Signed)
OUTPATIENT PHYSICAL THERAPY TREATMENT NOTE/ RE-EVALUATION   Patient Name: [name] MRN: [id number] DOB:May 03, 1971, 51 y.o., female Today's Date: 06/25/2022  PCP: Hayden Rasmussen, MD REFERRING PROVIDER: Hayden Rasmussen, MD  END OF SESSION:   PT End of Session - 06/25/22 1009     Visit Number 4    Number of Visits 8    Date for PT Re-Evaluation 07/30/22    Authorization Type [employer] MCR    Authorization Time Period FOTO v6, v10, kx mod v15    Progress Note Due on Visit 10    PT Start Time 1007    PT Stop Time 1045    PT Time Calculation (min) 38 min    Activity Tolerance Patient tolerated treatment well    Behavior During Therapy WFL for tasks assessed/performed               Past Medical History:  Diagnosis Date   Bipolar 1 disorder ([place])    followed by psychiatry   Chronic kidney disease    hx of short term dialysis   Compartment syndrome ([place])    left arm   ETOH abuse    Foot drop, left    Foot drop, left foot    Polysubstance abuse ([place])    history of   Smoker    Swallowing difficulty    cannot swallow pills   Teeth decayed    Vitamin D deficiency    Past Surgical History:  Procedure Laterality Date   arm surgery Left    compartment syndrome surgeries   CESAREAN SECTION     fetal distress   COLONOSCOPY  11/07/2020   HAND SURGERY     for fracture   TONSILLECTOMY     TOOTH EXTRACTION Bilateral 10/26/2018   Procedure: MULTIPLE EXTRACTION # 18, 21, 22, 27, 28;  Surgeon: Diona Browner, DDS;  Location: [facility];  Service: Oral Surgery;  Laterality: Bilateral;   Patient Active Problem List   Diagnosis Date Noted   Smoker 05/06/2016   Bipolar disorder ([place]) 05/06/2016    REFERRING DIAG: Drop foot evaluate, falls             right shoulder pain with abduction  THERAPY DIAG:  Pain in left ankle and joints of left foot  Muscle weakness (generalized)  Other abnormalities of gait and mobility  Chronic right shoulder  pain  Rationale for Evaluation and Treatment Rehabilitation  PERTINENT HISTORY: Bipolar, CKD, left drop foot  PRECAUTIONS: Fall  SUBJECTIVE: Pt reports she hopes to focus on either her shoulder or her ankle/ balance at each visit, rather than treating both at every visit. She reports 0/10 pain currently. She also reports varied adherence to her HEP.  PAIN:  Are you having pain? Yes:  NPRS scale: 2/10 (pain increased with walking) Pain location: Left foot (mostly bottom of foot) Pain description: Constant, stabbing Aggravating factors: Standing, walking, stepping on objects without shoes on Relieving factors: Medication   OBJECTIVE: (objective measures completed at initial evaluation unless otherwise dated)   PATIENT SURVEYS:  FOTO 48% functional status   COGNITION: Overall cognitive status: Within functional limits for tasks assessed                          SENSATION: Patient reports sensation changes of left ankle and foot   MUSCLE LENGTH: Calf flexibility deficit   PALPATION: Patient reports generalized left ankle and foot tenderness primarily to plantar aspect of forefoot  LOWER EXTREMITY ROM:   Active ROM Right eval Left eval  Ankle dorsiflexion - -18 deg PROM 0 deg  Ankle plantarflexion - 60  Ankle inversion - 15  Ankle eversion - 20    LOWER EXTREMITY MMT:   MMT Right eval Left eval  Hip flexion   -  Hip extension   -  Hip abduction   -  Knee flexion   -  Knee extension   -  Ankle dorsiflexion 5 2  Ankle plantarflexion 5 3  Ankle inversion 5 3-  Ankle eversion 5 4     UPPER EXTREMITY ROM:   A/PROM Right 06/11/2022 Left 06/11/2022  Shoulder flexion 180 (pain at mid-range of motion) 180  Shoulder abduction 180 180  Shoulder internal rotation 90 95  Shoulder external rotation 90 95  (Blank rows = not tested)  UPPER EXTREMITY MMT:  MMT Right 06/11/2022 Left 06/11/2022  Shoulder flexion 4/5p! 4+/5  Shoulder abduction 5/5 5/5  Shoulder  internal rotation 5/5 5/5  Shoulder external rotation 4+/5p! 5/5  Middle trapezius 3+/5 3/5  Lower trapezius 3+/5 3/5  Latissimus dorsi 3+/5 3+/5  Elbow flexion 5/5 5/5  Elbow extension 5/5 5/5  Grip strength (lbs)    (Blank rows = not tested)  SHOULDER SPECIAL TESTS (06/11/2022): Hawkins-Kennedy: (-) on Rt Neer's: (-) on Rt Crank test: (-) on Rt Biceps load I and II: (-) on Rt  JOINT MOBILITY TESTING (06/11/2022):  GHJ WNL BIL, CPA's hypomobile and painful through T3-T8  PALPATION (06/11/2022):  No TTP to Rt shoulder   FUNCTIONAL TESTS:  Single leg stance: Right - 30+ seconds, Left - 4 seconds   GAIT: Distance walked: 50 ft Assistive device utilized: None Level of assistance: Complete Independence Comments: Slight steppage gait on left due to drop foot     TODAY'S TREATMENT:  [facility] Adult PT Treatment:                                                DATE: 06/25/2022 Therapeutic Exercise: UBE x59mn forward/ x241m backward while collecting subjective information Standing corner pec stretch x2m9mStanding BIL shoulder scaption with 4# dumbbells with back at wall 3x10 Standing shoulder rolls with 4# dumbbells 3x10 forward and backward Bent-over rear delt flies with 4# dumbbell 3x10 BIL Seated pec flies with serratus punch at end range at Omega machine with 35# cable 3x10 Standing cross-body shoulder adduction stretch x1mi24mIL Forearm planks with alternating lateral arm walkouts 3x20 Manual Therapy: N/A Neuromuscular re-ed: N/A Therapeutic Activity: N/A Modalities: N/A Self Care: N/A  [facility] Adult PT Treatment:                                                DATE: 06/18/2022 Therapeutic Exercise: Standing corner pec stretch x2min50manding single arm chest fly with 7# cable 2x10 BIL Kickstand stance on Airex pad with Lead foot on the edge of the pad and contralateral single arm Pallof press with 3# cable 2x10 BIL Standing BIL shoulder scaption with 3# cables 3x10 Standing  heel raises on edge of Airex pad 3x15 Seated ankle inversion towel slide with 5# dumbbell x5 BIL Seated ankle eversion towel slide with 5# dumbbell x5 BIL Standing gastroc slant board stretch x2min 76mual  Therapy: N/A Neuromuscular re-ed: N/A Therapeutic Activity: N/A Modalities: N/A Self Care: N/A  [facility] Adult PT Treatment:                                                DATE: 06/11/2022 Therapeutic Exercise: Seated BIL scaption lift with 3# dumbbells 3x10 Sidelying shoulder ER with 3# dumbbell 3x10 BIL Kickstand stance on Airex pad with Lt foot in front on the edge of the pad 4x30sec with CGA  Manual Therapy: N/A Neuromuscular re-ed: N/A Therapeutic Activity: Review FOTO with pt education Modalities: N/A Self Care: N/A   PATIENT EDUCATION:  Education details: Exam findings, POC, HEP Person educated: Patient Education method: Explanation, Demonstration, Tactile cues, Verbal cues, and Handouts Education comprehension: verbalized understanding, returned demonstration, verbal cues required, tactile cues required, and needs further education   HOME EXERCISE PROGRAM: Access Code: [email or web address] URL: [email or web address] Date: 06/11/2022 Prepared by: Vanessa [place]  Exercises - Long Sitting Calf Stretch with Strap  - 2-3 x daily - 3 reps - 30 seconds hold - Seated Figure 4 Ankle Inversion with Resistance  - 1 x daily - 3 sets - 15 reps - Long Sitting Ankle Eversion with Resistance  - 1 x daily - 3 sets - 15 reps - Long Sitting Ankle Plantar Flexion with Resistance  - 1 x daily - 3 sets - 15 reps - Seated Ankle Dorsiflexion with Resistance  - 1 x daily - 3 sets - 15 reps - Tandem Stance  - 2-3 x daily - 3 reps - 30 seconds hold - Single Leg Stance  - 2-3 x daily - 3 reps - 10 seconds hold  Added 06/11/2022 - Standing Shoulder External Rotation with Resistance  - 1 x daily - 7 x weekly - 3 sets - 10 reps - 3-sec hold    Added 06/18/2022:  - Corner Pec Major  Stretch  - 1 x daily - 7 x weekly - 2-minute hold - Ankle Inversion Eversion Towel Slide  - 1 x daily - 7 x weekly - 2 sets - 5 reps   ASSESSMENT: CLINICAL IMPRESSION: Pt responded well to all interventions today, demonstrating good form and no pain throughout the session. Per pt request to focus on either ankle or shoulder at each visit, exercises today focused on the shoulder with the plan being to focus on ankle at next visit. She will continue to benefit from skilled PT to address her primary impairments and return to her prior level of function with less limitation.     OBJECTIVE IMPAIRMENTS Abnormal gait, decreased activity tolerance, decreased balance, decreased ROM, decreased strength, impaired flexibility, impaired sensation, and pain.    ACTIVITY LIMITATIONS standing, squatting, stairs, locomotion level, and caring for others   PARTICIPATION LIMITATIONS: meal prep, cleaning, laundry, shopping, community activity, and occupation   [place], Past/current experiences, Social background, and Time since onset of injury/illness/exacerbation are also affecting patient's functional outcome.        GOALS: Goals reviewed with patient? Yes   SHORT TERM GOALS: Target date: 07/02/2022    Patient will be I with initial HEP in order to progress with therapy. Baseline: HEP provided at eval Goal status: INITIAL   2.  PT will review FOTO with patient by 3rd visit in order to understand expected progress and outcome with therapy. Baseline: Assessed at evaluation 06/11/2022: Reviewed  Goal status:  ACHIEVED   3.  Patient will demonstrate -10 deg left ankle DF AROM in order to improve gait and reduce fall risk with walking due to catching toe. Baseline: -18 deg left ankle DF AROM Goal status: INITIAL   LONG TERM GOALS: Target date: 07/30/2022    Patient will be I with final HEP to maintain progress from PT. Baseline: HEP provided at eval Goal status: INITIAL   2.  Patient  will report >/= 60% status on FOTO to indicate improved functional ability. Baseline: 48% Goal status: INITIAL   3.  Patient will demonstrate left ankle DF strength 3/5 MMT in order to improve ability to lift foot while negotiating stairs. Baseline: Left ankle DF strength 2/5 MMT Goal status: INITIAL   4.  Patient will demonstrate left ankle DF AROM to 0 deg in order to normalize gait to improve ability to walking around grocery store and reduce risk for future falls. Baseline: -18 deg left ankle DF AROM Goal status: INITIAL   5.  Patient will exhibit left single leg stance >/= 10 sec in order to indicate improved balance and reduce fall risk due to ankle instability. Baseline: Left single leg stance 4 sec Goal status: INITIAL   6. *New 06/11/2022* Pt will achieve BIL global parascapular strength of 4+/5 or greater in the prophylaxis of future mechanical shoulder/ upper back pain.   Baseline 97/20/2023: See MMT chart   Goal Status: INITIAL     PLAN: PT FREQUENCY: 1x/week   PT DURATION: 8 weeks   PLANNED INTERVENTIONS: Therapeutic exercises, Therapeutic activity, Neuromuscular re-education, Balance training, Gait training, Patient/Family education, Self Care, Joint mobilization, Joint manipulation, Stair training, Aquatic Therapy, Dry Needling, Electrical stimulation, Cryotherapy, Moist heat, Taping, Manual therapy, and Re-evaluation   PLAN FOR NEXT SESSION: calf stretching, progressive ankle strengthening, balance training, general left LE strengthening and step training, RTC/ parascapular strengthening    Vanessa Duvall, PT, DPT 06/25/22 11:30 AM

## 2022-07-02 ENCOUNTER — Ambulatory Visit: Payer: Medicare HMO

## 2022-07-06 ENCOUNTER — Ambulatory Visit: Payer: Medicare HMO

## 2022-07-07 ENCOUNTER — Ambulatory Visit: Payer: Medicare HMO

## 2022-07-07 DIAGNOSIS — M6281 Muscle weakness (generalized): Secondary | ICD-10-CM

## 2022-07-07 DIAGNOSIS — M25572 Pain in left ankle and joints of left foot: Secondary | ICD-10-CM | POA: Diagnosis not present

## 2022-07-07 DIAGNOSIS — G8929 Other chronic pain: Secondary | ICD-10-CM

## 2022-07-07 DIAGNOSIS — R2689 Other abnormalities of gait and mobility: Secondary | ICD-10-CM

## 2022-07-07 NOTE — Therapy (Signed)
OUTPATIENT PHYSICAL THERAPY TREATMENT NOTE/ RE-EVALUATION   Patient Name: [name] MRN: [id number] DOB:September 10, 1971, 51 y.o., female Today's Date: 07/07/2022  PCP: Hayden Rasmussen, MD REFERRING PROVIDER: Hayden Rasmussen, MD  END OF SESSION:   PT End of Session - 07/07/22 1531     Visit Number 5    Number of Visits 8    Date for PT Re-Evaluation 07/30/22    Authorization Type [employer] MCR    Authorization Time Period FOTO v6, v10, kx mod v15    Progress Note Due on Visit 10    PT Start Time 1532    PT Stop Time 1612    PT Time Calculation (min) 40 min    Activity Tolerance Patient tolerated treatment well    Behavior During Therapy WFL for tasks assessed/performed                Past Medical History:  Diagnosis Date   Bipolar 1 disorder ([place])    followed by psychiatry   Chronic kidney disease    hx of short term dialysis   Compartment syndrome ([place])    left arm   ETOH abuse    Foot drop, left    Foot drop, left foot    Polysubstance abuse ([place])    history of   Smoker    Swallowing difficulty    cannot swallow pills   Teeth decayed    Vitamin D deficiency    Past Surgical History:  Procedure Laterality Date   arm surgery Left    compartment syndrome surgeries   CESAREAN SECTION     fetal distress   COLONOSCOPY  11/07/2020   HAND SURGERY     for fracture   TONSILLECTOMY     TOOTH EXTRACTION Bilateral 10/26/2018   Procedure: MULTIPLE EXTRACTION # 18, 21, 22, 27, 28;  Surgeon: Diona Browner, DDS;  Location: [facility];  Service: Oral Surgery;  Laterality: Bilateral;   Patient Active Problem List   Diagnosis Date Noted   Smoker 05/06/2016   Bipolar disorder ([place]) 05/06/2016    REFERRING DIAG: Drop foot evaluate, falls             right shoulder pain with abduction  THERAPY DIAG:  Pain in left ankle and joints of left foot  Muscle weakness (generalized)  Other abnormalities of gait and mobility  Chronic right shoulder  pain  Rationale for Evaluation and Treatment Rehabilitation  PERTINENT HISTORY: Bipolar, CKD, left drop foot  PRECAUTIONS: Fall  SUBJECTIVE: Pt reports 4/10 Lt foot pain, reporting only 2/10 shoulder pain today. She reports adherence to her HEP.  PAIN:  Are you having pain? Yes:  NPRS scale: 4/10 (pain increased with walking) Pain location: Left foot (mostly bottom of foot) Pain description: Constant, stabbing Aggravating factors: Standing, walking, stepping on objects without shoes on Relieving factors: Medication   OBJECTIVE: (objective measures completed at initial evaluation unless otherwise dated)   PATIENT SURVEYS:  FOTO 48% functional status   COGNITION: Overall cognitive status: Within functional limits for tasks assessed                          SENSATION: Patient reports sensation changes of left ankle and foot   MUSCLE LENGTH: Calf flexibility deficit   PALPATION: Patient reports generalized left ankle and foot tenderness primarily to plantar aspect of forefoot   LOWER EXTREMITY ROM:   Active ROM Right eval Left eval  Ankle dorsiflexion - -18 deg  PROM 0 deg  Ankle plantarflexion - 60  Ankle inversion - 15  Ankle eversion - 20    LOWER EXTREMITY MMT:   MMT Right eval Left eval  Hip flexion   -  Hip extension   -  Hip abduction   -  Knee flexion   -  Knee extension   -  Ankle dorsiflexion 5 2  Ankle plantarflexion 5 3  Ankle inversion 5 3-  Ankle eversion 5 4     UPPER EXTREMITY ROM:   A/PROM Right 06/11/2022 Left 06/11/2022  Shoulder flexion 180 (pain at mid-range of motion) 180  Shoulder abduction 180 180  Shoulder internal rotation 90 95  Shoulder external rotation 90 95  (Blank rows = not tested)  UPPER EXTREMITY MMT:  MMT Right 06/11/2022 Left 06/11/2022  Shoulder flexion 4/5p! 4+/5  Shoulder abduction 5/5 5/5  Shoulder internal rotation 5/5 5/5  Shoulder external rotation 4+/5p! 5/5  Middle trapezius 3+/5 3/5  Lower  trapezius 3+/5 3/5  Latissimus dorsi 3+/5 3+/5  Elbow flexion 5/5 5/5  Elbow extension 5/5 5/5  Grip strength (lbs)    (Blank rows = not tested)  SHOULDER SPECIAL TESTS (06/11/2022): Hawkins-Kennedy: (-) on Rt Neer's: (-) on Rt Crank test: (-) on Rt Biceps load I and II: (-) on Rt  JOINT MOBILITY TESTING (06/11/2022):  GHJ WNL BIL, CPA's hypomobile and painful through T3-T8  PALPATION (06/11/2022):  No TTP to Rt shoulder   FUNCTIONAL TESTS:  Single leg stance: Right - 30+ seconds, Left - 4 seconds   GAIT: Distance walked: 50 ft Assistive device utilized: None Level of assistance: Complete Independence Comments: Slight steppage gait on left due to drop foot     TODAY'S TREATMENT:  [facility] Adult PT Treatment:                                                DATE: 07/07/2022 Therapeutic Exercise: Kickstand stance on Airex pad with front foot on edge of pad with 5# kettlebell lateral handoff's 2x20 BIL Heel raises on edge of Airex pad 3x12 Standing ankle inversion with ankle attachment around forefoot with 3# cable 2x10 BIL Standing ankle eversion with ankle attachment around forefoot with 3# cable 2x10 BIL Standing gastroc slant board stretch x2 minutes Standing soleus slant board stretch x2 minutes Mini-squat side steps with 10# cable to waist attachment 2x5 BIL Manual Therapy: N/A Neuromuscular re-ed: N/A Therapeutic Activity: N/A Modalities: N/A Self Care: N/A   [facility] Adult PT Treatment:                                                DATE: 06/25/2022 Therapeutic Exercise: UBE x63mn forward/ x262m backward while collecting subjective information Standing corner pec stretch x2m34mStanding BIL shoulder scaption with 4# dumbbells with back at wall 3x10 Standing shoulder rolls with 4# dumbbells 3x10 forward and backward Bent-over rear delt flies with 4# dumbbell 3x10 BIL Seated pec flies with serratus punch at end range at Omega machine with 35# cable 3x10 Standing  cross-body shoulder adduction stretch x1mi13mIL Forearm planks with alternating lateral arm walkouts 3x20 Manual Therapy: N/A Neuromuscular re-ed: N/A Therapeutic Activity: N/A Modalities: N/A Self Care: N/A  [facility]  [facility]: 06/18/2022 Therapeutic Exercise: Standing corner pec stretch x78mn Standing single arm chest fly with 7# cable 2x10 BIL Kickstand stance on Airex pad with Lead foot on the edge of the pad and contralateral single arm Pallof press with 3# cable 2x10 BIL Standing BIL shoulder scaption with 3# cables 3x10 Standing heel raises on edge of Airex pad 3x15 Seated ankle inversion towel slide with 5# dumbbell x5 BIL Seated ankle eversion towel slide with 5# dumbbell x5 BIL Standing gastroc slant board stretch x284m Manual Therapy: N/A Neuromuscular re-ed: N/A Therapeutic Activity: N/A Modalities: N/A Self Care: N/A     PATIENT EDUCATION:  Education details: Exam findings, POC, HEP Person educated: Patient Education method: Explanation, Demonstration, Tactile cues, Verbal cues, and Handouts Education comprehension: verbalized understanding, returned demonstration, verbal cues required, tactile cues required, and needs further education   HOME EXERCISE PROGRAM: Access Code: [email or web address] URL: [email or web address] Date: 06/11/2022 Prepared by: TuVanessa DurhamExercises - Long Sitting Calf Stretch with Strap  - 2-3 x daily - 3 reps - 30 seconds hold - Seated Figure 4 Ankle Inversion with Resistance  - 1 x daily - 3 sets - 15 reps - Long Sitting Ankle Eversion with Resistance  - 1 x daily - 3 sets - 15 reps - Long Sitting Ankle Plantar Flexion with Resistance  - 1 x daily - 3 sets - 15 reps - Seated Ankle Dorsiflexion with Resistance  - 1 x daily - 3 sets - 15 reps - Tandem Stance  - 2-3 x daily - 3 reps - 30 seconds hold - Single Leg Stance  - 2-3 x daily - 3 reps - 10 seconds  hold  Added 06/11/2022 - Standing Shoulder External Rotation with Resistance  - 1 x daily - 7 x weekly - 3 sets - 10 reps - 3-sec hold    Added 06/18/2022:  - Corner Pec Major Stretch  - 1 x daily - 7 x weekly - 2-minute hold - Ankle Inversion Eversion Towel Slide  - 1 x daily - 7 x weekly - 2 sets - 5 reps   ASSESSMENT: CLINICAL IMPRESSION: Exercises today focused primarily on pt's ankles, and she responded well to progressed ankle strengthening/ stability exercises with good form and no pain. Pt will continue to benefit from skilled PT to address her primary impairments and return to her prior level of function with less limitation.     OBJECTIVE IMPAIRMENTS Abnormal gait, decreased activity tolerance, decreased balance, decreased ROM, decreased strength, impaired flexibility, impaired sensation, and pain.    ACTIVITY LIMITATIONS standing, squatting, stairs, locomotion level, and caring for others   PARTICIPATION LIMITATIONS: meal prep, cleaning, laundry, shopping, community activity, and occupation   [place]/current experiences, Social background, and Time since onset of injury/illness/exacerbation are also affecting patient's functional outcome.        GOALS: Goals reviewed with patient? Yes   SHORT TERM GOALS: Target date: 07/02/2022    Patient will be I with initial HEP in order to progress with therapy. Baseline: HEP provided at eval 07/07/2022: Pt reports adherence to her HEP Goal status: ACHIEVED   2.  PT will review FOTO with patient by 3rd visit in order to understand expected progress and outcome with therapy. Baseline: Assessed at evaluation 06/11/2022: Reviewed  Goal status: ACHIEVED   3.  Patient will demonstrate -10 deg left ankle DF AROM in order to improve gait and reduce fall risk with walking due to catching toe. Baseline: -18 deg left ankle DF AROM  Goal status: INITIAL   LONG TERM GOALS: Target date: 07/30/2022    Patient will be I  with final HEP to maintain progress from PT. Baseline: HEP provided at eval Goal status: INITIAL   2.  Patient will report >/= 60% status on FOTO to indicate improved functional ability. Baseline: 48% Goal status: INITIAL   3.  Patient will demonstrate left ankle DF strength 3/5 MMT in order to improve ability to lift foot while negotiating stairs. Baseline: Left ankle DF strength 2/5 MMT Goal status: INITIAL   4.  Patient will demonstrate left ankle DF AROM to 0 deg in order to normalize gait to improve ability to walking around grocery store and reduce risk for future falls. Baseline: -18 deg left ankle DF AROM Goal status: INITIAL   5.  Patient will exhibit left single leg stance >/= 10 sec in order to indicate improved balance and reduce fall risk due to ankle instability. Baseline: Left single leg stance 4 sec Goal status: INITIAL   6. *New 06/11/2022* Pt will achieve BIL global parascapular strength of 4+/5 or greater in the prophylaxis of future mechanical shoulder/ upper back pain.   Baseline 97/20/2023: See MMT chart   Goal Status: INITIAL     PLAN: PT FREQUENCY: 1x/week   PT DURATION: 8 weeks   PLANNED INTERVENTIONS: Therapeutic exercises, Therapeutic activity, Neuromuscular re-education, Balance training, Gait training, Patient/Family education, Self Care, Joint mobilization, Joint manipulation, Stair training, Aquatic Therapy, Dry Needling, Electrical stimulation, Cryotherapy, Moist heat, Taping, Manual therapy, and Re-evaluation   PLAN FOR NEXT SESSION: calf stretching, progressive ankle strengthening, balance training, general left LE strengthening and step training, RTC/ parascapular strengthening    Vanessa Fords, PT, DPT 07/07/22 4:12 PM

## 2022-07-20 NOTE — Therapy (Signed)
OUTPATIENT PHYSICAL THERAPY TREATMENT NOTE/ RE-EVALUATION   Patient Name: [name] MRN: [id number] DOB:1971-03-04, 51 y.o., female Today's Date: 07/20/2022  PCP: Hayden Rasmussen, MD REFERRING PROVIDER: Hayden Rasmussen, MD  END OF SESSION:        Past Medical History:  Diagnosis Date   Bipolar 1 disorder [facility])    followed by psychiatry   Chronic kidney disease    hx of short term dialysis   Compartment syndrome ([place])    left arm   ETOH abuse    Foot drop, left    Foot drop, left foot    Polysubstance abuse ([place])    history of   Smoker    Swallowing difficulty    cannot swallow pills   Teeth decayed    Vitamin D deficiency    Past Surgical History:  Procedure Laterality Date   arm surgery Left    compartment syndrome surgeries   CESAREAN SECTION     fetal distress   COLONOSCOPY  11/07/2020   HAND SURGERY     for fracture   TONSILLECTOMY     TOOTH EXTRACTION Bilateral 10/26/2018   Procedure: MULTIPLE EXTRACTION # 18, 21, 22, 27, 28;  Surgeon: Diona Browner, DDS;  Location: [facility];  Service: Oral Surgery;  Laterality: Bilateral;   Patient Active Problem List   Diagnosis Date Noted   Smoker 05/06/2016   Bipolar disorder ([place]) 05/06/2016    REFERRING DIAG: Drop foot evaluate, falls             right shoulder pain with abduction  THERAPY DIAG:  No diagnosis found.  Rationale for Evaluation and Treatment Rehabilitation  PERTINENT HISTORY: Bipolar, CKD, left drop foot  PRECAUTIONS: Fall  SUBJECTIVE: Pt reports 4/10 Lt foot pain, reporting only 2/10 shoulder pain today. She reports adherence to her HEP.  PAIN:  Are you having pain? Yes:  NPRS scale: 4/10 (pain increased with walking) Pain location: Left foot (mostly bottom of foot) Pain description: Constant, stabbing Aggravating factors: Standing, walking, stepping on objects without shoes on Relieving factors: Medication   OBJECTIVE: (objective measures completed at  initial evaluation unless otherwise dated) PATIENT SURVEYS:  FOTO 48% functional status   COGNITION: Overall cognitive status: Within functional limits for tasks assessed                          SENSATION: Patient reports sensation changes of left ankle and foot   MUSCLE LENGTH: Calf flexibility deficit   PALPATION: Patient reports generalized left ankle and foot tenderness primarily to plantar aspect of forefoot   LOWER EXTREMITY ROM:   Active ROM Right eval Left eval  Ankle dorsiflexion - -18 deg PROM 0 deg  Ankle plantarflexion - 60  Ankle inversion - 15  Ankle eversion - 20    LOWER EXTREMITY MMT:   MMT Right eval Left eval  Hip flexion   -  Hip extension   -  Hip abduction   -  Knee flexion   -  Knee extension   -  Ankle dorsiflexion 5 2  Ankle plantarflexion 5 3  Ankle inversion 5 3-  Ankle eversion 5 4     UPPER EXTREMITY ROM:   A/PROM Right 06/11/2022 Left 06/11/2022  Shoulder flexion 180 (pain at mid-range of motion) 180  Shoulder abduction 180 180  Shoulder internal rotation 90 95  Shoulder external rotation 90 95  (Blank rows = not tested)  UPPER EXTREMITY MMT:  MMT Right 06/11/2022 Left 06/11/2022  Shoulder flexion 4/5p! 4+/5  Shoulder abduction 5/5 5/5  Shoulder internal rotation 5/5 5/5  Shoulder external rotation 4+/5p! 5/5  Middle trapezius 3+/5 3/5  Lower trapezius 3+/5 3/5  Latissimus dorsi 3+/5 3+/5  Elbow flexion 5/5 5/5  Elbow extension 5/5 5/5  Grip strength (lbs)    (Blank rows = not tested)  SHOULDER SPECIAL TESTS (06/11/2022): Hawkins-Kennedy: (-) on Rt Neer's: (-) on Rt Crank test: (-) on Rt Biceps load I and II: (-) on Rt  JOINT MOBILITY TESTING (06/11/2022):  GHJ WNL BIL, CPA's hypomobile and painful through T3-T8  PALPATION (06/11/2022):  No TTP to Rt shoulder   FUNCTIONAL TESTS:  Single leg stance: Right - 30+ seconds, Left - 4 seconds   GAIT: Distance walked: 50 ft Assistive device utilized: None Level  of assistance: Complete Independence Comments: Slight steppage gait on left due to drop foot     TODAY'S TREATMENT: [facility] Adult PT Treatment:                                                DATE: 07/21/2022 Therapeutic Exercise: Kickstand stance on Airex pad with front foot on edge of pad with 5# kettlebell lateral handoff's 2x20 BIL Heel raises on edge of Airex pad 3x12 Standing ankle inversion with ankle attachment around forefoot with 3# cable 2x10 BIL Standing ankle eversion with ankle attachment around forefoot with 3# cable 2x10 BIL Standing gastroc slant board stretch x2 minutes Standing soleus slant board stretch x2 minutes Mini-squat side steps with 10# cable to waist attachment 2x5 BIL   [facility] Adult PT Treatment:                                                DATE: 07/07/2022 Therapeutic Exercise: Kickstand stance on Airex pad with front foot on edge of pad with 5# kettlebell lateral handoff's 2x20 BIL Heel raises on edge of Airex pad 3x12 Standing ankle inversion with ankle attachment around forefoot with 3# cable 2x10 BIL Standing ankle eversion with ankle attachment around forefoot with 3# cable 2x10 BIL Standing gastroc slant board stretch x2 minutes Standing soleus slant board stretch x2 minutes Mini-squat side steps with 10# cable to waist attachment 2x5 BIL Manual Therapy: N/A Neuromuscular re-ed: N/A Therapeutic Activity: N/A Modalities: N/A Self Care: N/A  [facility] Adult PT Treatment:                                                DATE: 06/25/2022 Therapeutic Exercise: UBE x18mn forward/ x256m backward while collecting subjective information Standing corner pec stretch x2m2mStanding BIL shoulder scaption with 4# dumbbells with back at wall 3x10 Standing shoulder rolls with 4# dumbbells 3x10 forward and backward Bent-over rear delt flies with 4# dumbbell 3x10 BIL Seated pec flies with serratus punch at end range at Omega machine with 35# cable 3x10 Standing  cross-body shoulder adduction stretch [id number] Forearm planks with alternating lateral arm walkouts 3x20 Manual Therapy: N/A Neuromuscular re-ed: N/A Therapeutic Activity: N/A Modalities: N/A Self Care: N/A   PATIENT EDUCATION:  Education details: HEP  Person educated: Patient Education method: Explanation, Demonstration, Tactile cues, Verbal cues Education comprehension: verbalized understanding, returned demonstration, verbal cues required, tactile cues required, and needs further education   HOME EXERCISE PROGRAM: Access Code: [email or web address]    ASSESSMENT: CLINICAL IMPRESSION: ***  Exercises today focused primarily on pt's ankles, and she responded well to progressed ankle strengthening/ stability exercises with good form and no pain. Pt will continue to benefit from skilled PT to address her primary impairments and return to her prior level of function with less limitation.     OBJECTIVE IMPAIRMENTS Abnormal gait, decreased activity tolerance, decreased balance, decreased ROM, decreased strength, impaired flexibility, impaired sensation, and pain.    ACTIVITY LIMITATIONS standing, squatting, stairs, locomotion level, and caring for others   PARTICIPATION LIMITATIONS: meal prep, cleaning, laundry, shopping, community activity, and occupation   [place], Past/current experiences, Social background, and Time since onset of injury/illness/exacerbation are also affecting patient's functional outcome.        GOALS: Goals reviewed with patient? Yes   SHORT TERM GOALS: Target date: 07/02/2022    Patient will be I with initial HEP in order to progress with therapy. Baseline: HEP provided at eval 07/07/2022: Pt reports adherence to her HEP Goal status: ACHIEVED   2.  PT will review FOTO with patient by 3rd visit in order to understand expected progress and outcome with therapy. Baseline: Assessed at evaluation 06/11/2022: Reviewed  Goal status: ACHIEVED   3.   Patient will demonstrate -10 deg left ankle DF AROM in order to improve gait and reduce fall risk with walking due to catching toe. Baseline: -18 deg left ankle DF AROM Goal status: INITIAL   LONG TERM GOALS: Target date: 07/30/2022    Patient will be I with final HEP to maintain progress from PT. Baseline: HEP provided at eval Goal status: INITIAL   2.  Patient will report >/= 60% status on FOTO to indicate improved functional ability. Baseline: 48% Goal status: INITIAL   3.  Patient will demonstrate left ankle DF strength 3/5 MMT in order to improve ability to lift foot while negotiating stairs. Baseline: Left ankle DF strength 2/5 MMT Goal status: INITIAL   4.  Patient will demonstrate left ankle DF AROM to 0 deg in order to normalize gait to improve ability to walking around grocery store and reduce risk for future falls. Baseline: -18 deg left ankle DF AROM Goal status: INITIAL   5.  Patient will exhibit left single leg stance >/= 10 sec in order to indicate improved balance and reduce fall risk due to ankle instability. Baseline: Left single leg stance 4 sec Goal status: INITIAL   6. *New 06/11/2022* Pt will achieve BIL global parascapular strength of 4+/5 or greater in the prophylaxis of future mechanical shoulder/ upper back pain.   Baseline 97/20/2023: See MMT chart   Goal Status: INITIAL     PLAN: PT FREQUENCY: 1x/week   PT DURATION: 8 weeks   PLANNED INTERVENTIONS: Therapeutic exercises, Therapeutic activity, Neuromuscular re-education, Balance training, Gait training, Patient/Family education, Self Care, Joint mobilization, Joint manipulation, Stair training, Aquatic Therapy, Dry Needling, Electrical stimulation, Cryotherapy, Moist heat, Taping, Manual therapy, and Re-evaluation   PLAN FOR NEXT SESSION: calf stretching, progressive ankle strengthening, balance training, general left LE strengthening and step training, RTC/ parascapular strengthening    Hilda Blades, PT, DPT, LAT, ATC 07/20/22  3:57 PM Phone: [phone] Fax: [phone]

## 2022-07-21 ENCOUNTER — Other Ambulatory Visit: Payer: Self-pay

## 2022-07-21 ENCOUNTER — Encounter: Payer: Self-pay | Admitting: Physical Therapy

## 2022-07-21 ENCOUNTER — Ambulatory Visit: Payer: Medicare HMO | Admitting: Physical Therapy

## 2022-07-21 DIAGNOSIS — R2689 Other abnormalities of gait and mobility: Secondary | ICD-10-CM

## 2022-07-21 DIAGNOSIS — M25572 Pain in left ankle and joints of left foot: Secondary | ICD-10-CM

## 2022-07-21 DIAGNOSIS — M6281 Muscle weakness (generalized): Secondary | ICD-10-CM

## 2022-07-21 DIAGNOSIS — G8929 Other chronic pain: Secondary | ICD-10-CM

## 2022-07-28 ENCOUNTER — Ambulatory Visit: Payer: Medicare HMO | Attending: Family Medicine

## 2022-07-28 DIAGNOSIS — M6281 Muscle weakness (generalized): Secondary | ICD-10-CM

## 2022-07-28 DIAGNOSIS — R2681 Unsteadiness on feet: Secondary | ICD-10-CM

## 2022-07-28 DIAGNOSIS — M25511 Pain in right shoulder: Secondary | ICD-10-CM | POA: Insufficient documentation

## 2022-07-28 DIAGNOSIS — G8929 Other chronic pain: Secondary | ICD-10-CM

## 2022-07-28 DIAGNOSIS — M25572 Pain in left ankle and joints of left foot: Secondary | ICD-10-CM

## 2022-07-28 DIAGNOSIS — R2689 Other abnormalities of gait and mobility: Secondary | ICD-10-CM | POA: Diagnosis present

## 2022-07-28 NOTE — Therapy (Signed)
OUTPATIENT PHYSICAL THERAPY TREATMENT NOTE/ DISCHARGE SUMMARY   Patient Name: [name] MRN: [id number] DOB:1971/06/29, 51 y.o., female Today's Date: 07/28/2022  PCP: Hayden Rasmussen, MD REFERRING PROVIDER: Hayden Rasmussen, MD   END OF SESSION:   PT End of Session - 07/28/22 1531     Visit Number 7    Number of Visits 8    Date for PT Re-Evaluation 07/30/22    Authorization Type [employer] MCR    Authorization Time Period Pending additional auth    PT Start Time 1532    PT Stop Time 1610    PT Time Calculation (min) 38 min    Activity Tolerance Patient tolerated treatment well    Behavior During Therapy WFL for tasks assessed/performed                  Past Medical History:  Diagnosis Date   Bipolar 1 disorder ([place])    followed by psychiatry   Chronic kidney disease    hx of short term dialysis   Compartment syndrome ([place])    left arm   ETOH abuse    Foot drop, left    Foot drop, left foot    Polysubstance abuse ([place])    history of   Smoker    Swallowing difficulty    cannot swallow pills   Teeth decayed    Vitamin D deficiency    Past Surgical History:  Procedure Laterality Date   arm surgery Left    compartment syndrome surgeries   CESAREAN SECTION     fetal distress   COLONOSCOPY  11/07/2020   HAND SURGERY     for fracture   TONSILLECTOMY     TOOTH EXTRACTION Bilateral 10/26/2018   Procedure: MULTIPLE EXTRACTION # 18, 21, 22, 27, 28;  Surgeon: Diona Browner, DDS;  Location: [facility];  Service: Oral Surgery;  Laterality: Bilateral;   Patient Active Problem List   Diagnosis Date Noted   Smoker 05/06/2016   Bipolar disorder ([place]) 05/06/2016    REFERRING DIAG: Drop foot evaluate, falls             Right shoulder pain with abduction  THERAPY DIAG:  Pain in left ankle and joints of left foot  Muscle weakness (generalized)  Other abnormalities of gait and mobility  Chronic right shoulder pain  Unsteadiness on  feet  Rationale for Evaluation and Treatment Rehabilitation  PERTINENT HISTORY: Bipolar, CKD, left drop foot  PRECAUTIONS: Fall  SUBJECTIVE: Pt reports her shoulder is much better, although she continues to have stiffness in her Lt ankle. Pt reports she feels ready to be discharged from PT at this time.  PAIN:  Are you having pain? Yes:  NPRS scale: 0/10  Pain location: Left foot (mostly bottom of foot) Pain description: Constant, stabbing Aggravating factors: Standing, walking, stepping on objects without shoes on Relieving factors: Medication   OBJECTIVE: (objective measures completed at initial evaluation unless otherwise dated) PATIENT SURVEYS:  FOTO 48% functional status 07/21/2022: 47% 07/28/2022: 49%   SENSATION: Patient reports sensation changes of left ankle and foot   MUSCLE LENGTH: Calf flexibility deficit   PALPATION: Patient reports generalized left ankle and foot tenderness primarily to plantar aspect of forefoot   LOWER EXTREMITY ROM:   Active ROM Right eval Left eval Left 07/21/2022  Ankle dorsiflexion - -18 deg PROM 0 deg Lacking 8 deg  Ankle plantarflexion - 60   Ankle inversion - 15   Ankle eversion - 20  LOWER EXTREMITY MMT:   MMT Right eval Left eval Left 07/28/2022  Hip flexion   -   Hip extension   -   Hip abduction   -   Knee flexion   -   Knee extension   -   Ankle dorsiflexion _0 Ankle plantarflexion _1 Ankle inversion 5 3- 3  Ankle eversion _2 UPPER EXTREMITY ROM:   A/PROM Right 06/11/2022 Left 06/11/2022  Shoulder flexion 180 (pain at mid-range of motion) 180  Shoulder abduction 180 180  Shoulder internal rotation 90 95  Shoulder external rotation 90 95  (Blank rows = not tested)  UPPER EXTREMITY MMT:  MMT Right 06/11/2022 Left 06/11/2022 Right 07/28/2022 Left 07/28/2022  Shoulder flexion 4/5p! 4+/5 4+/5 4+/5  Shoulder abduction 5/5 5/5    Shoulder internal rotation 5/5 5/5    Shoulder external rotation  4+/5p! 5/5 4+/5   Middle trapezius 3+/5 3/5 4/5 4/5  Lower trapezius 3+/5 3/5 4/5 4/5  Latissimus dorsi 3+/5 3+/5 4/5 4/5  Elbow flexion 5/5 5/5    Elbow extension 5/5 5/5    Grip strength (lbs)      (Blank rows = not tested)  SHOULDER SPECIAL TESTS (06/11/2022): Hawkins-Kennedy: (-) on Rt Neer's: (-) on Rt Crank test: (-) on Rt Biceps load I and II: (-) on Rt  JOINT MOBILITY TESTING (06/11/2022):  GHJ WNL BIL, CPA's hypomobile and painful through T3-T8  PALPATION (06/11/2022):  No TTP to Rt shoulder   FUNCTIONAL TESTS:  Single leg stance: Right - 30+ seconds, Left - 4 seconds  07/28/2022: SLS: 4 seconds   GAIT: Distance walked: 50 ft Assistive device utilized: None Level of assistance: Complete Independence Comments: Slight steppage gait on left due to drop foot     TODAY'S TREATMENT:  [facility] Adult PT Treatment:                                                DATE: 07/28/2022 Therapeutic Exercise: Standing balance clock 2x6 full rotations BIL Dead lifts with two 10# dumbbells 3x8 Heel raises with two 10# dumbbells 3x20 15# kettlebell swings 3x15 Standing gastroc slant board stretch x75mn Manual Therapy: N/A Neuromuscular re-ed: N/A Therapeutic Activity: Re-assessment of objective measures with pt education Re-assessment of FOTO with pt education Modalities: N/A Self Care: N/A   [facility] Adult PT Treatment:                                                DATE: 07/21/2022 Therapeutic Exercise: NuStep L6 x 5 min with UE/LE while taking subjective Longsitting calf stretch with strap 3 x 30 sec each Slant board calf stretch 3 x 30 sec Heel raises on edge of Airex 3 x 20 Kickstand SLS on edge of Airex 3 x 30 seconds each Manual: Left ankle AP/PA mobs and distraction Left ankle PROM   [facility]                                                [facility]: [facility] Therapeutic Exercise: KRudean Hitt  stance on Airex pad with front foot on edge of pad with 5#  kettlebell lateral handoff's 2x20 BIL Heel raises on edge of Airex pad 3x12 Standing ankle inversion with ankle attachment around forefoot with 3# cable 2x10 BIL Standing ankle eversion with ankle attachment around forefoot with 3# cable 2x10 BIL Standing gastroc slant board stretch x2 minutes Standing soleus slant board stretch x2 minutes Mini-squat side steps with 10# cable to waist attachment 2x5 BIL Manual Therapy: N/A Neuromuscular re-ed: N/A Therapeutic Activity: N/A Modalities: N/A Self Care: N/A     PATIENT EDUCATION:  Education details: HEP Person educated: Patient Education method: Consulting civil engineer, Demonstration, Corporate treasurer cues, Verbal cues Education comprehension: verbalized understanding, returned demonstration, verbal cues required, tactile cues required, and needs further education   HOME EXERCISE PROGRAM: Access Code: [email or web address] URL: [email or web address] Date: 07/28/2022 Prepared by: Vanessa [place]  Exercises - Long Sitting Calf Stretch with Strap  - 2-3 x daily - 3 reps - 30 seconds hold - Seated Figure 4 Ankle Inversion with Resistance  - 1 x daily - 3 sets - 15 reps - Long Sitting Ankle Eversion with Resistance  - 1 x daily - 3 sets - 15 reps - Long Sitting Ankle Plantar Flexion with Resistance  - 1 x daily - 3 sets - 15 reps - Seated Ankle Dorsiflexion with Resistance  - 1 x daily - 3 sets - 15 reps - Tandem Stance  - 2-3 x daily - 3 reps - 30 seconds hold - Single Leg Stance  - 2-3 x daily - 3 reps - 10 seconds hold - Standing Shoulder External Rotation with Resistance  - 1 x daily - 7 x weekly - 3 sets - 10 reps - 3-sec hold - Corner Pec Major Stretch  - 1 x daily - 7 x weekly - 2-minute hold - Ankle Inversion Eversion Towel Slide  - 1 x daily - 7 x weekly - 2 sets - 5 reps  Added 07/28/2022 - Single Leg Balance with Clock Reach  - 1 x daily - 7 x weekly - 2 sets - 6 reps - Half Deadlift with Kettlebell  - 1 x daily - 7 x weekly - 3 sets - 10  reps    ASSESSMENT: CLINICAL IMPRESSION: Upon re-assessment of objective measures, the pt has made good progress in shoulder/ parascapular strength and ankle strength, although she remains limited in her single leg stance and functional ability. Due to pt's hx of neurological damage contributing to ankle/ foot weakness and N/T, it is believed that pt has maximized her rehab potential for her current impairments. She will continue to benefit from progressed an independent HEP and is agreeable to discharge at this time.     OBJECTIVE IMPAIRMENTS Abnormal gait, decreased activity tolerance, decreased balance, decreased ROM, decreased strength, impaired flexibility, impaired sensation, and pain.    ACTIVITY LIMITATIONS standing, squatting, stairs, locomotion level, and caring for others   PARTICIPATION LIMITATIONS: meal prep, cleaning, laundry, shopping, community activity, and occupation   [place], Past/current experiences, Social background, and Time since onset of injury/illness/exacerbation are also affecting patient's functional outcome.        GOALS: Goals reviewed with patient? Yes   SHORT TERM GOALS: Target date: 07/02/2022    Patient will be I with initial HEP in order to progress with therapy. Baseline: HEP provided at eval 07/07/2022: Pt reports adherence to her HEP Goal status: ACHIEVED   2.  PT will review FOTO with patient by 3rd visit in order to  understand expected progress and outcome with therapy. Baseline: Assessed at evaluation 06/11/2022: Reviewed  Goal status: ACHIEVED   3.  Patient will demonstrate -10 deg left ankle DF AROM in order to improve gait and reduce fall risk with walking due to catching toe. Baseline: -18 deg left ankle DF AROM 07/21/2022: -8 deg Goal status: ACHIEVED   LONG TERM GOALS: Target date: 07/30/2022    Patient will be I with final HEP to maintain progress from PT. Baseline: HEP provided at eval 07/28/2022: Pt reports  adherence to her HEP Goal status: ACHIEVED   2.  Patient will report >/= 60% status on FOTO to indicate improved functional ability. Baseline: 48% 07/28/2022: 49% Goal status: NOT MET   3.  Patient will demonstrate left ankle DF strength 3/5 MMT in order to improve ability to lift foot while negotiating stairs. Baseline: Left ankle DF strength 2/5 MMT 07/28/2022: 4/5 Goal status: ACHIEVED   4.  Patient will demonstrate left ankle DF AROM to 0 deg in order to normalize gait to improve ability to walking around grocery store and reduce risk for future falls. Baseline: -18 deg left ankle DF AROM 07/21/2022: -8 deg Goal status: PARTIALLY MET   5.  Patient will exhibit left single leg stance >/= 10 sec in order to indicate improved balance and reduce fall risk due to ankle instability. Baseline: Left single leg stance 4 sec 07/28/2022: 4 seconds Goal status: NOT MET   6. *New 06/11/2022* Pt will achieve BIL global parascapular strength of 4+/5 or greater in the prophylaxis of future mechanical shoulder/ upper back pain.   Baseline 97/20/2023: See MMT chart   07/28/2022: 4/5 globally   Goal Status: PARTIALLY MET     PLAN: PT FREQUENCY: 1x/week   PT DURATION: 8 weeks   PLANNED INTERVENTIONS: Therapeutic exercises, Therapeutic activity, Neuromuscular re-education, Balance training, Gait training, Patient/Family education, Self Care, Joint mobilization, Joint manipulation, Stair training, Aquatic Therapy, Dry Needling, Electrical stimulation, Cryotherapy, Moist heat, Taping, Manual therapy, and Re-evaluation   PLAN FOR NEXT SESSION: Pt is discharged from PT at this time.  PHYSICAL THERAPY DISCHARGE SUMMARY  Visits from Start of Care: 7  Current functional level related to goals / functional outcomes: Pt has made progress in Lt ankle DF and PF MMT, global parascapular MMT, and shoulder pain.   Remaining deficits: Continued deficits in SLS, Lt global ankle strength   Education /  Equipment: HEP   Patient agrees to discharge. Patient goals were partially met. Patient is being discharged due to being pleased with the current functional level.  Referring diagnosis? Drop foot evaluate, falls             Right shoulder pain with abduction Treatment diagnosis? (if different than referring diagnosis) Pain in left ankle and joints of left foot  Muscle weakness (generalized)  Other abnormalities of gait and mobility  Chronic right shoulder pain  Unsteadiness on feet What was this (referring dx) caused by? _0  Surgery _1  Fall _2  Ongoing issue _3  Arthritis _4  Other: ____________  Laterality: _5  Rt _6  Lt _7  Both  Check all possible CPT codes:  *CHOOSE 10 OR LESS*    _8  97110 (Therapeutic Exercise)  _9  92507 (SLP Treatment)  _10  50932 (Neuro Re-ed)   _11  67124 (Swallowing Treatment)   _12  58099 (Gait Training)   _13  83382 (Cognitive Training, 1st 15 minutes) _14  97140 (Manual Therapy)   _15  97130 (Cognitive Training, each add'l 15 minutes)  _16  97164 (Re-evaluation)                              _17   Other, List CPT Code ____________  _0  75423 (Therapeutic Activities)     _1  70230 (Self Care)   _2  All codes above (97110 - 97535)  _3  17209 (Mechanical Traction)  _4  97014 (E-stim Unattended)  _5  97032 (E-stim manual)  _6  97033 (Ionto)  _7  97035 (Ultrasound) _8  97750 (Physical Performance Training) _9  10681 (Aquatic Therapy) _10  97016 (Vasopneumatic Device) _11  66196 (Paraffin) _12  94098 (Contrast Bath) _13  97597 (Wound Care 1st 20 sq cm) _14  97598 (Wound Care each add'l 20 sq cm) _15  97760 (Orthotic Fabrication, Fitting, Training Initial) _16  [id number] (Prosthetic Management and Training Initial) _17  [id number] (Orthotic or Prosthetic Training/ Modification Subsequent)   Vanessa Register, PT, DPT 07/28/22 4:12 PM

## 2022-10-30 ENCOUNTER — Ambulatory Visit (INDEPENDENT_AMBULATORY_CARE_PROVIDER_SITE_OTHER): Payer: Medicare HMO | Admitting: Podiatry

## 2022-10-30 DIAGNOSIS — M2062 Acquired deformities of toe(s), unspecified, left foot: Secondary | ICD-10-CM

## 2022-10-30 DIAGNOSIS — M21372 Foot drop, left foot: Secondary | ICD-10-CM | POA: Diagnosis not present

## 2022-10-30 DIAGNOSIS — M79672 Pain in left foot: Secondary | ICD-10-CM

## 2022-10-30 NOTE — Progress Notes (Unsigned)
Subjective:   Patient ID: [name], female   DOB: 51 y.o.   MRN: [id number]   HPI Chief Complaint  Patient presents with   Peripheral Neuropathy    Numbness and burning left foot, and swelling in the ankle, X-Rays taken today     She fell down the steps and she was laying on her left side for "days".  This injury was about 4 years ago. She can move the big toe only and she has noticed the 5th toe coming up and she cannot move the lesser toes. She had foot drop foot and she got a brace but it was too painful. She cannot walk without wearing shoes. She has pain but she does not have feeling. She had a NCV previously. She is not great doing home exercises and she lost her TENS unit.     ROS  Past Medical History:  Diagnosis Date   Bipolar 1 disorder ([place])    followed by psychiatry   Chronic kidney disease    hx of short term dialysis   Compartment syndrome ([place])    left arm   ETOH abuse    Foot drop, left    Foot drop, left foot    Polysubstance abuse ([place])    history of   Smoker    Swallowing difficulty    cannot swallow pills   Teeth decayed    Vitamin D deficiency     Past Surgical History:  Procedure Laterality Date   arm surgery Left    compartment syndrome surgeries   CESAREAN SECTION     fetal distress   COLONOSCOPY  11/07/2020   HAND SURGERY     for fracture   TONSILLECTOMY     TOOTH EXTRACTION Bilateral 10/26/2018   Procedure: MULTIPLE EXTRACTION # 18, 21, 22, 27, 28;  Surgeon: Diona Browner, DDS;  Location: [facility];  Service: Oral Surgery;  Laterality: Bilateral;     Current Outpatient Medications:    atorvastatin (LIPITOR) 10 MG tablet, TAKE 1 TABLET(10 MG) BY MOUTH DAILY, Disp: 90 tablet, Rfl: 1   FLUoxetine (PROZAC) 20 MG tablet, Take 20 mg by mouth daily., Disp: , Rfl:    traZODone (DESYREL) 150 MG tablet, Take 150 mg by mouth at bedtime., Disp: , Rfl:    VRAYLAR capsule, Take 3 mg by mouth daily., Disp: , Rfl:   No Known  Allergies       Objective:  Physical Exam  ***     Assessment:  ***     Plan:  ***    -Offered pain management -main concern is the 5th toe

## 2022-11-18 ENCOUNTER — Other Ambulatory Visit: Payer: Self-pay | Admitting: Family Medicine

## 2022-11-18 DIAGNOSIS — Z1231 Encounter for screening mammogram for malignant neoplasm of breast: Secondary | ICD-10-CM

## 2023-01-08 ENCOUNTER — Ambulatory Visit: Payer: Medicare HMO

## 2023-01-19 ENCOUNTER — Ambulatory Visit
Admission: RE | Admit: 2023-01-19 | Discharge: 2023-01-19 | Disposition: A | Discharge status: Home / Self Care | Payer: Medicare HMO | Source: Ambulatory Visit | Attending: Family Medicine | Admitting: Family Medicine

## 2023-01-19 ENCOUNTER — Ambulatory Visit: Payer: Medicare HMO

## 2023-01-19 DIAGNOSIS — Z1231 Encounter for screening mammogram for malignant neoplasm of breast: Secondary | ICD-10-CM

## 2023-01-21 ENCOUNTER — Ambulatory Visit: Payer: Medicare HMO

## 2023-01-22 ENCOUNTER — Other Ambulatory Visit: Payer: Self-pay | Admitting: Family Medicine

## 2023-01-22 DIAGNOSIS — R928 Other abnormal and inconclusive findings on diagnostic imaging of breast: Secondary | ICD-10-CM

## 2023-01-30 ENCOUNTER — Ambulatory Visit
Admission: RE | Admit: 2023-01-30 | Discharge: 2023-01-30 | Disposition: A | Discharge status: Home / Self Care | Payer: Medicare HMO | Source: Ambulatory Visit | Attending: Family Medicine | Admitting: Family Medicine

## 2023-01-30 ENCOUNTER — Other Ambulatory Visit: Payer: Self-pay | Admitting: Family Medicine

## 2023-01-30 DIAGNOSIS — R928 Other abnormal and inconclusive findings on diagnostic imaging of breast: Secondary | ICD-10-CM

## 2023-01-30 DIAGNOSIS — N6310 Unspecified lump in the right breast, unspecified quadrant: Secondary | ICD-10-CM

## 2023-02-09 ENCOUNTER — Ambulatory Visit
Admission: RE | Admit: 2023-02-09 | Discharge: 2023-02-09 | Disposition: A | Discharge status: Home / Self Care | Payer: Medicare HMO | Source: Ambulatory Visit | Attending: Family Medicine | Admitting: Family Medicine

## 2023-02-09 DIAGNOSIS — N6310 Unspecified lump in the right breast, unspecified quadrant: Secondary | ICD-10-CM

## 2023-02-09 DIAGNOSIS — R928 Other abnormal and inconclusive findings on diagnostic imaging of breast: Secondary | ICD-10-CM

## 2023-02-09 HISTORY — PX: BREAST BIOPSY: SHX20

## 2023-03-13 IMAGING — DX DG FOOT COMPLETE 3+V*L*
3 series · 3 of 3 positions shown · non-contrast
Comparison: None.

CLINICAL DATA: Left foot pain.

EXAM:
LEFT FOOT - COMPLETE 3+ VIEW

[foot ap]
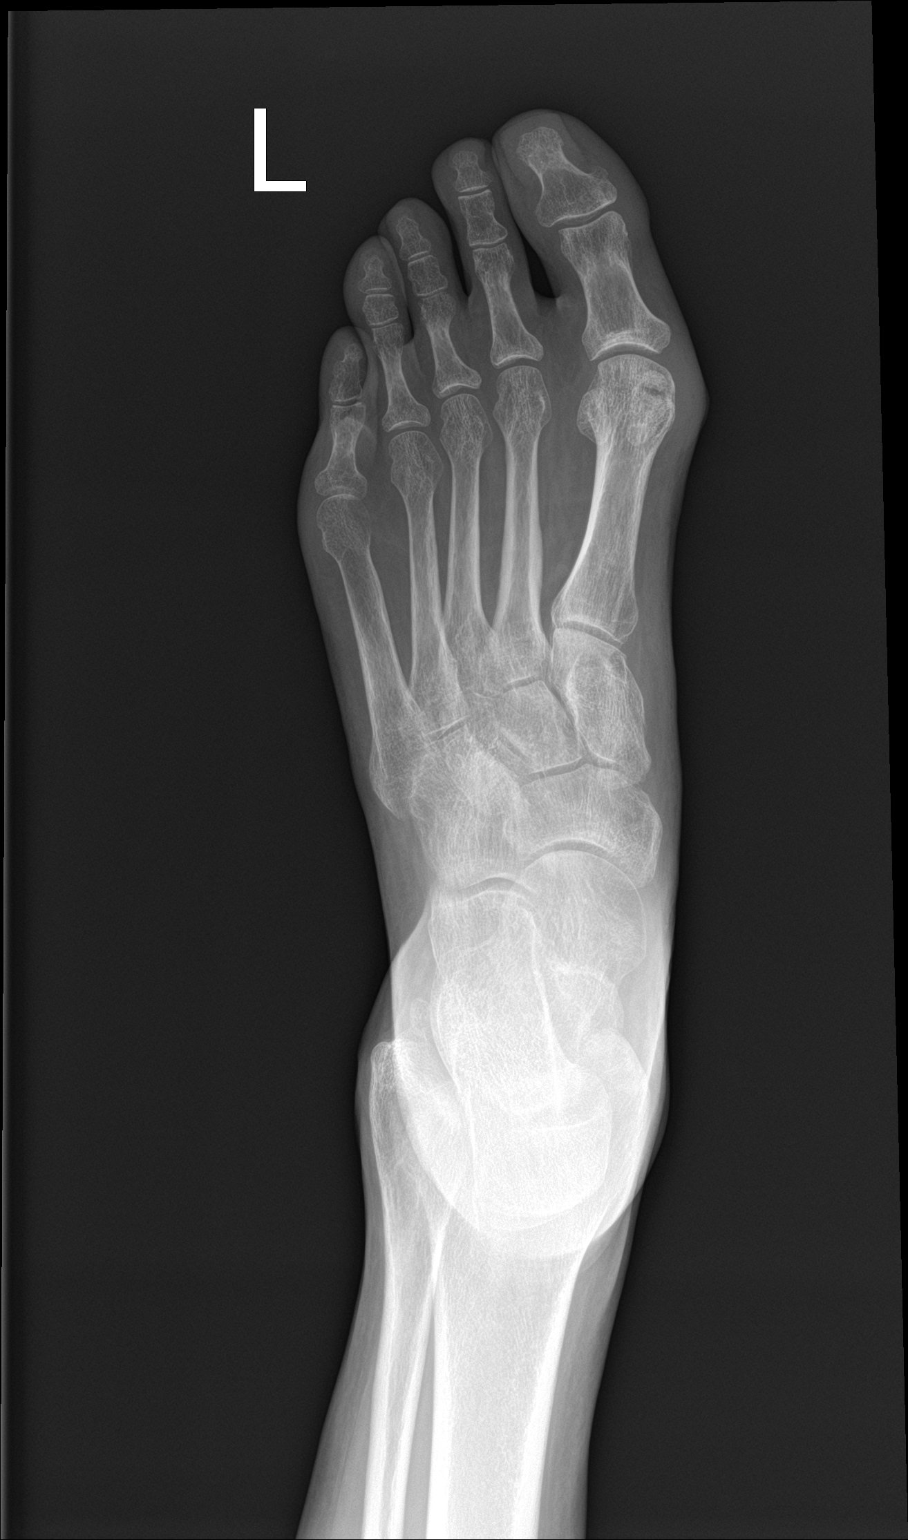

[foot obl]
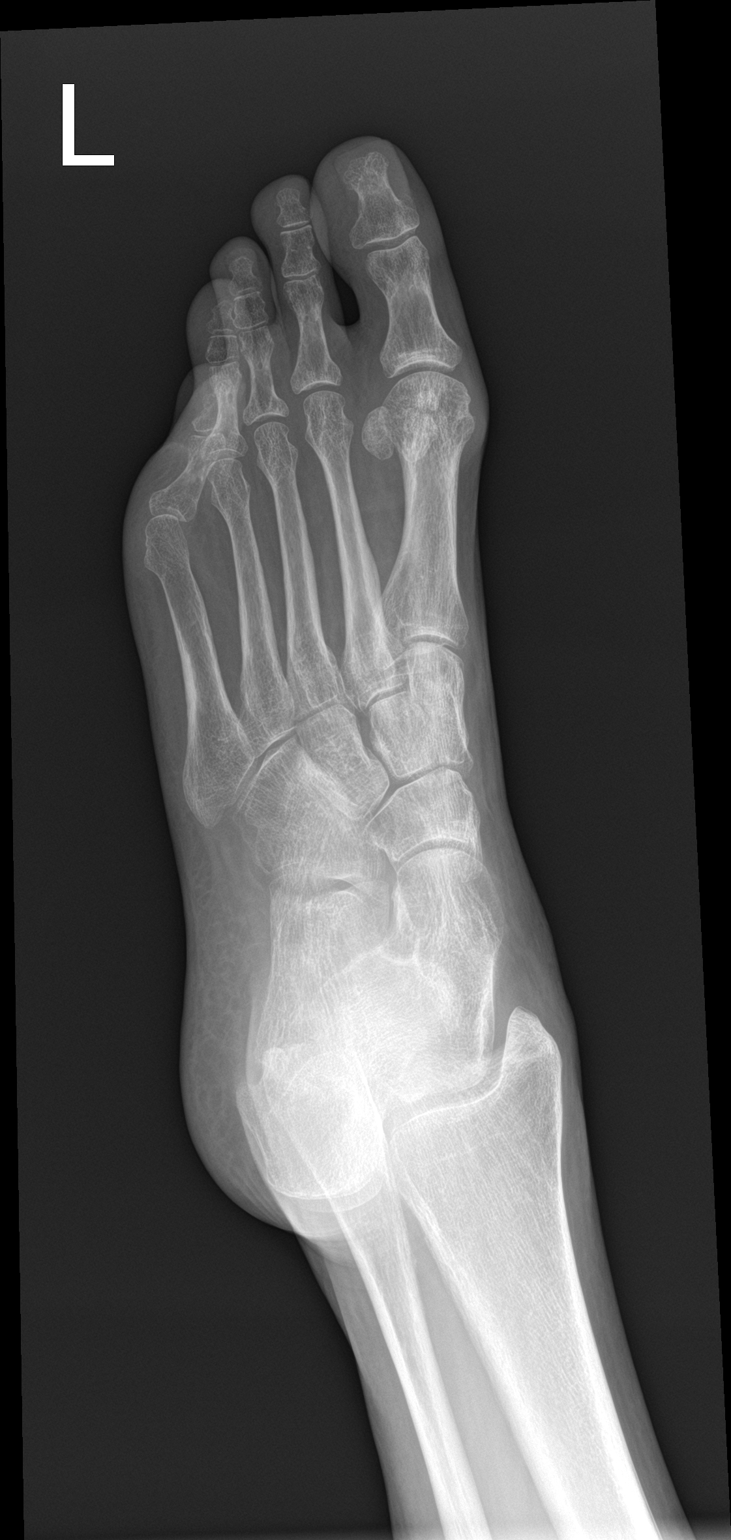

[foot lat]
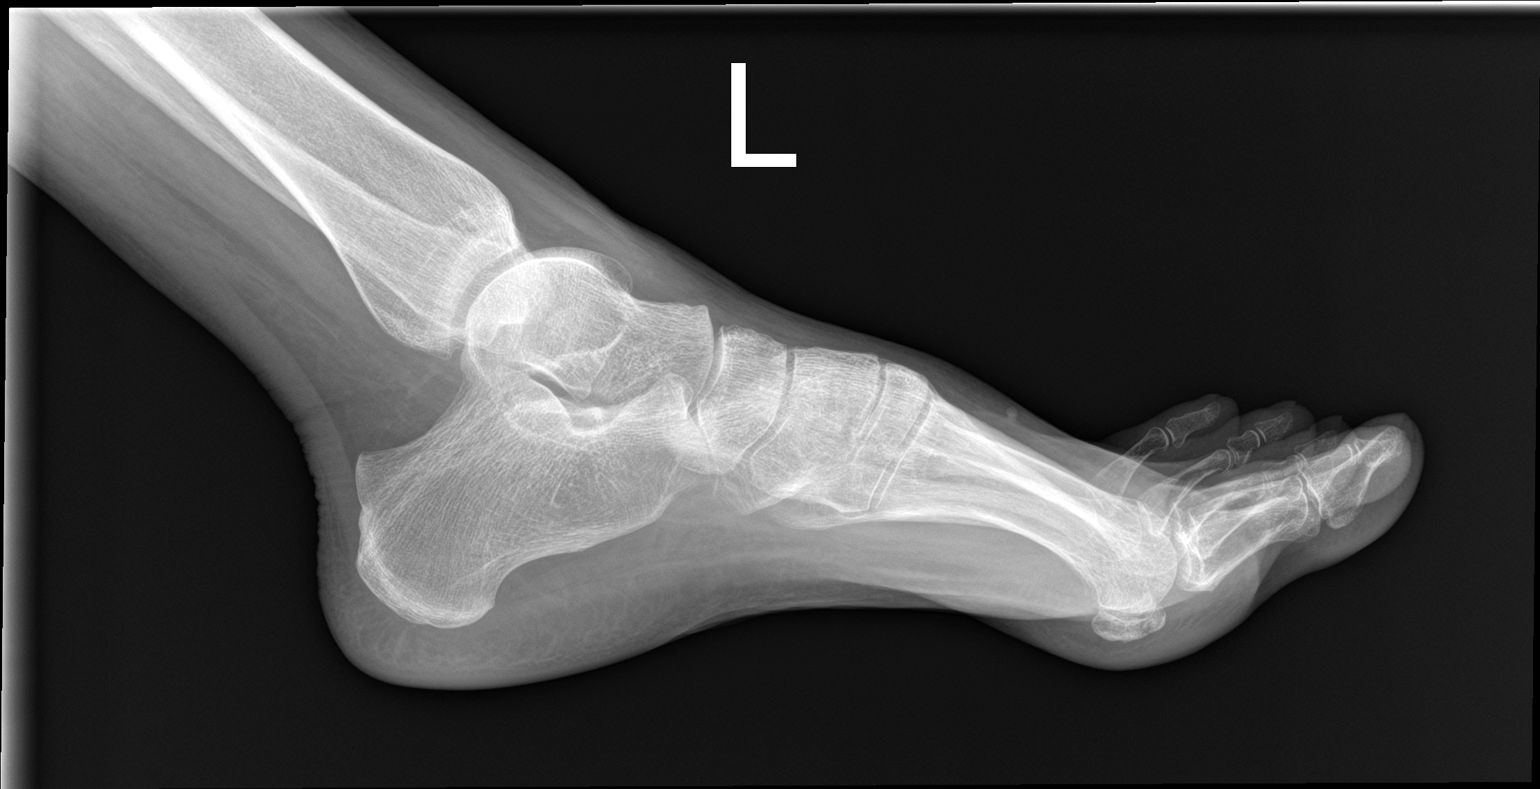

[3 of 3 positions shown; findings below may reference images not displayed]

FINDINGS: The joint spaces are maintained. Mild hallux valgus deformity noted.
Bipartite medial sesamoid of the great toe.

No acute bony findings.
IMPRESSION: No acute bony findings.

## 2023-03-13 IMAGING — DX DG HAND COMPLETE 3+V*L*
3 series · 3 of 3 positions shown · non-contrast
Comparison: None.

CLINICAL DATA: Patient fell last night. Left hand pain and
bruising.

EXAM:
LEFT HAND - COMPLETE 3+ VIEW

[hand pa]
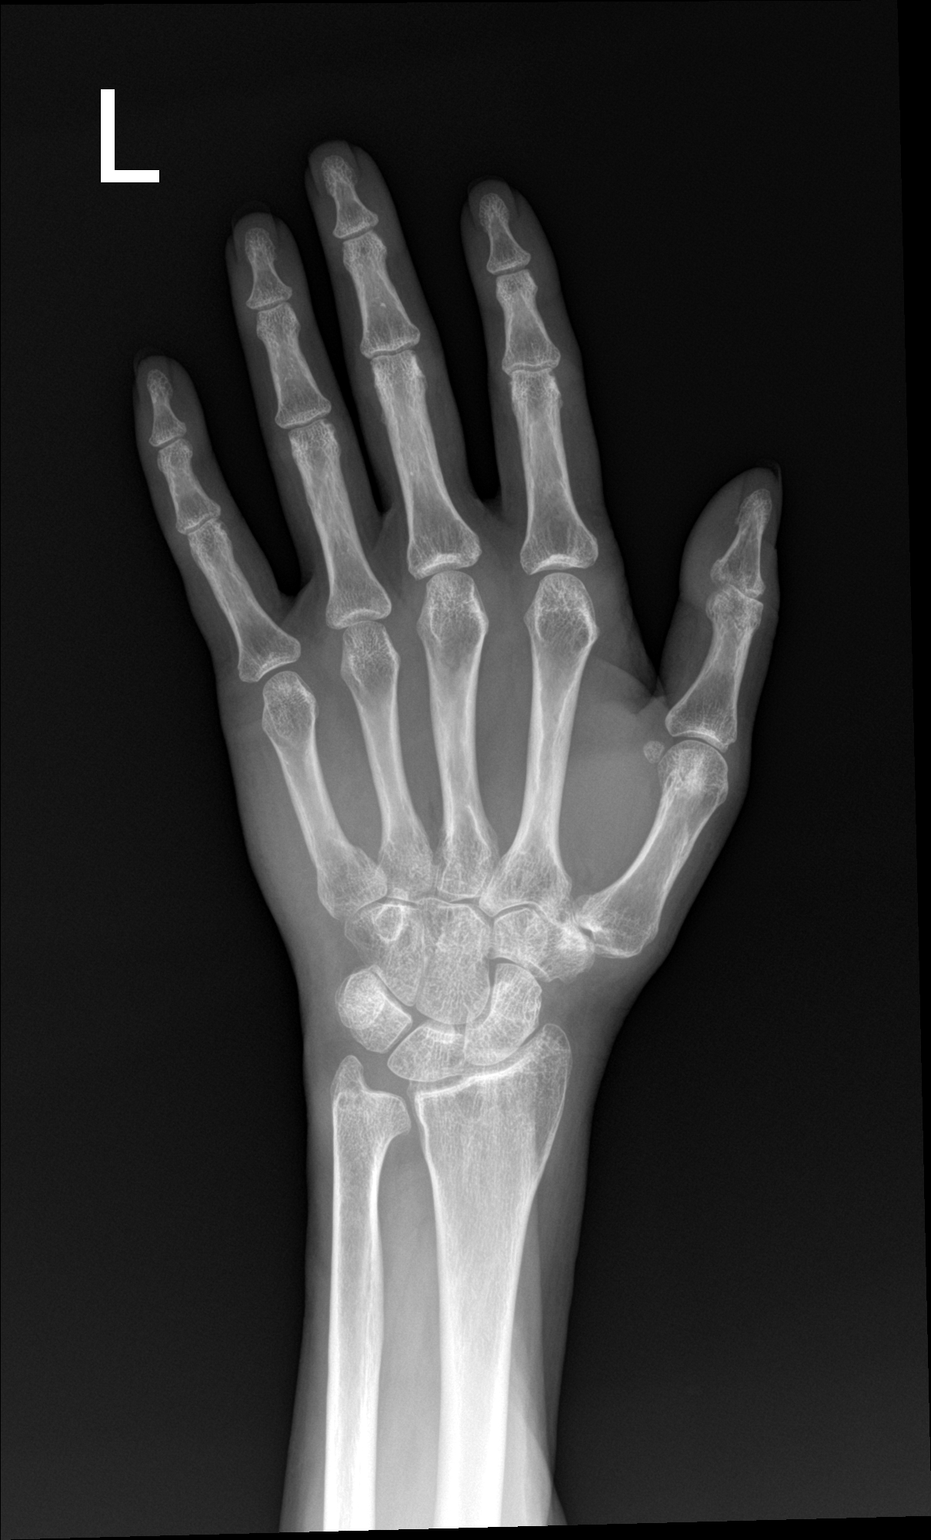

[hand obl]
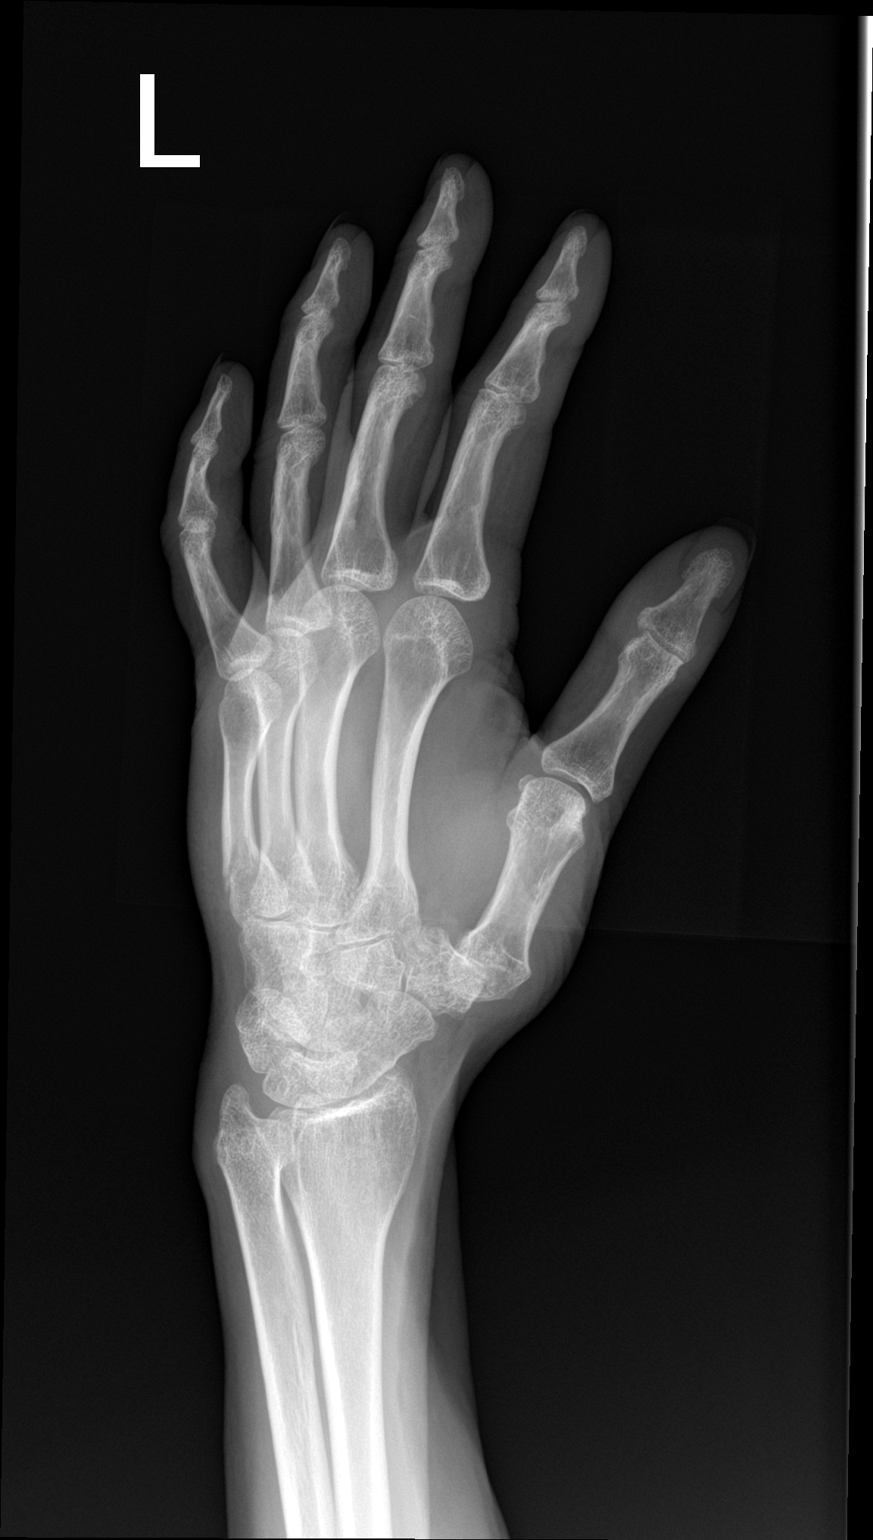

[hand lat]
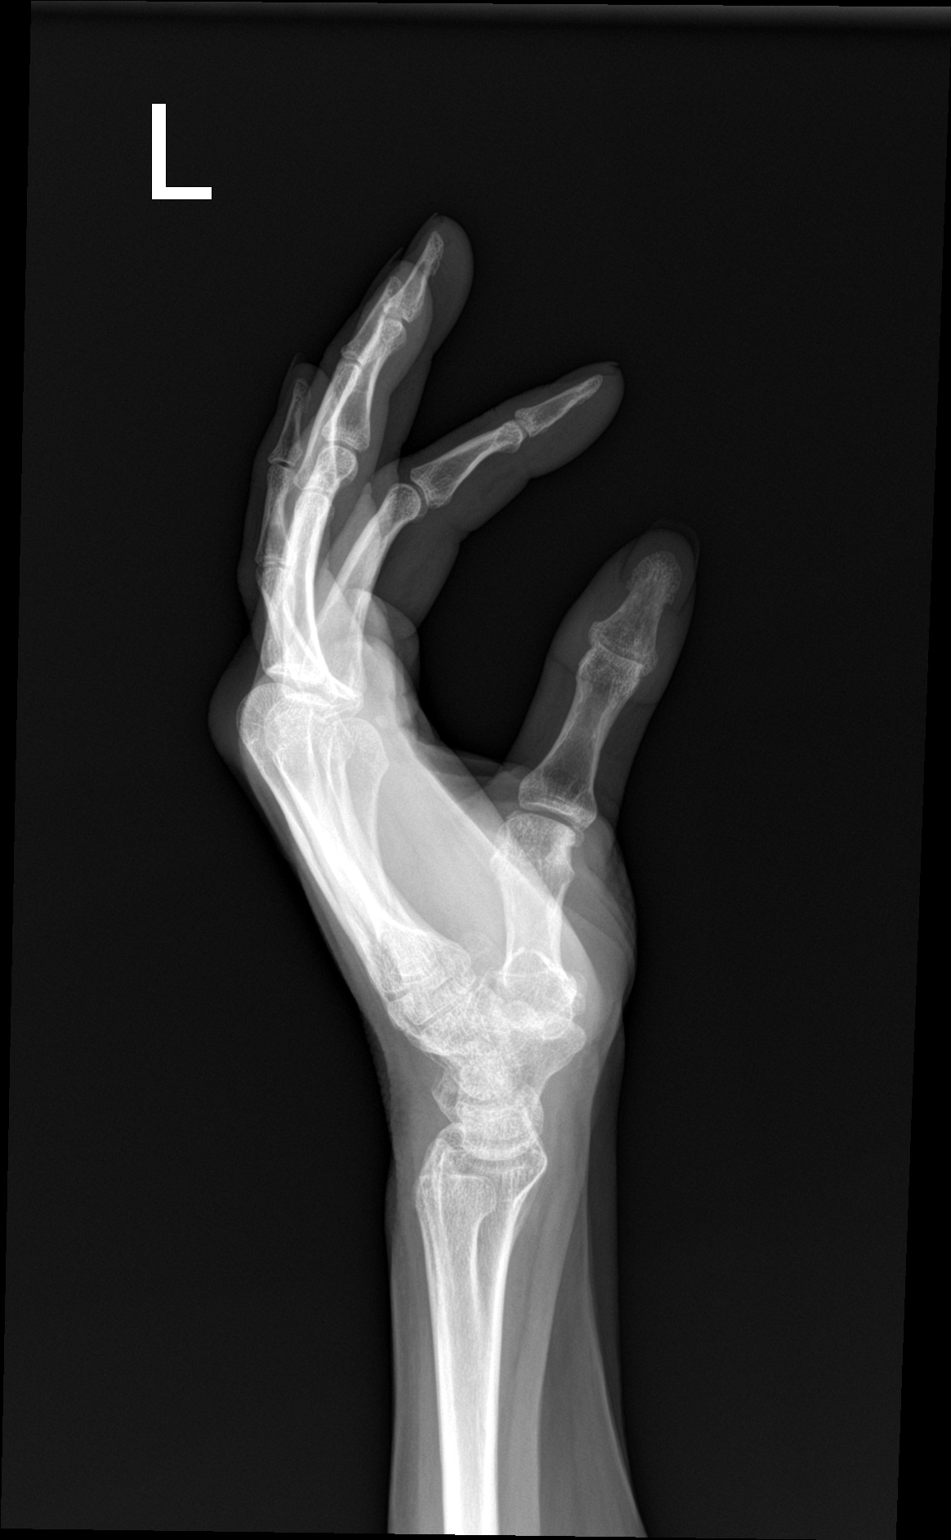

[3 of 3 positions shown; findings below may reference images not displayed]

FINDINGS: There is suspicion of an acute fracture involving the base of the
5th metacarpal, only clearly visualized on the oblique view. This
demonstrates no apparent intra-articular extension. The bones are
demineralized. Mild degenerative changes are present throughout the
interphalangeal joints and at the 1st carpometacarpal articulation.
There is some soft tissue swelling in the ulnar aspect of the hand,
without evidence of foreign body.
IMPRESSION: Probable acute nondisplaced fracture of the 5th metacarpal
base-correlate with point tenderness.

## 2023-06-15 ENCOUNTER — Other Ambulatory Visit: Payer: Self-pay | Admitting: Cardiology

## 2023-06-15 DIAGNOSIS — Z720 Tobacco use: Secondary | ICD-10-CM

## 2023-12-14 ENCOUNTER — Other Ambulatory Visit: Payer: Self-pay | Admitting: Family Medicine

## 2023-12-14 DIAGNOSIS — Z1231 Encounter for screening mammogram for malignant neoplasm of breast: Secondary | ICD-10-CM

## 2024-01-21 ENCOUNTER — Ambulatory Visit
Admission: RE | Admit: 2024-01-21 | Discharge: 2024-01-21 | Disposition: A | Discharge status: Home / Self Care | Payer: Medicare HMO | Source: Ambulatory Visit | Attending: Family Medicine | Admitting: Family Medicine

## 2024-01-21 DIAGNOSIS — Z1231 Encounter for screening mammogram for malignant neoplasm of breast: Secondary | ICD-10-CM

## 2024-12-28 ENCOUNTER — Other Ambulatory Visit: Payer: Self-pay | Admitting: Family Medicine

## 2024-12-28 DIAGNOSIS — Z1231 Encounter for screening mammogram for malignant neoplasm of breast: Secondary | ICD-10-CM

## 2025-01-23 ENCOUNTER — Ambulatory Visit

## 2025-01-23 DIAGNOSIS — Z1231 Encounter for screening mammogram for malignant neoplasm of breast: Secondary | ICD-10-CM
